# Patient Record
Sex: Female | Born: 1950
Health system: Southern US, Community
[De-identification: ages and names within clinical notes are randomized; demographics above are authoritative.]

## PROBLEM LIST (undated history)

## (undated) DIAGNOSIS — K2970 Gastritis, unspecified, without bleeding: Secondary | ICD-10-CM

## (undated) DIAGNOSIS — K635 Polyp of colon: Secondary | ICD-10-CM

## (undated) DIAGNOSIS — G5600 Carpal tunnel syndrome, unspecified upper limb: Secondary | ICD-10-CM

## (undated) DIAGNOSIS — E785 Hyperlipidemia, unspecified: Secondary | ICD-10-CM

## (undated) DIAGNOSIS — F329 Major depressive disorder, single episode, unspecified: Secondary | ICD-10-CM

## (undated) DIAGNOSIS — I10 Essential (primary) hypertension: Secondary | ICD-10-CM

## (undated) DIAGNOSIS — T8859XA Other complications of anesthesia, initial encounter: Secondary | ICD-10-CM

## (undated) DIAGNOSIS — F32A Depression, unspecified: Secondary | ICD-10-CM

## (undated) DIAGNOSIS — E119 Type 2 diabetes mellitus without complications: Secondary | ICD-10-CM

## (undated) DIAGNOSIS — G709 Myoneural disorder, unspecified: Secondary | ICD-10-CM

## (undated) HISTORY — DX: Essential (primary) hypertension: I10

## (undated) HISTORY — DX: Myoneural disorder, unspecified: G70.9

## (undated) HISTORY — DX: Major depressive disorder, single episode, unspecified: F32.9

## (undated) HISTORY — DX: Gastritis, unspecified, without bleeding: K29.70

## (undated) HISTORY — DX: Carpal tunnel syndrome, unspecified upper limb: G56.00

## (undated) HISTORY — DX: Polyp of colon: K63.5

## (undated) HISTORY — DX: Hyperlipidemia, unspecified: E78.5

## (undated) HISTORY — DX: Depression, unspecified: F32.A

## (undated) HISTORY — DX: Type 2 diabetes mellitus without complications: E11.9

---

## 2000-11-08 ENCOUNTER — Other Ambulatory Visit: Admission: RE | Admit: 2000-11-08 | Discharge: 2000-11-08 | Payer: Self-pay | Admitting: *Deleted

## 2001-01-23 ENCOUNTER — Ambulatory Visit (HOSPITAL_COMMUNITY): Admission: RE | Admit: 2001-01-23 | Discharge: 2001-01-23 | Payer: Self-pay | Admitting: Gastroenterology

## 2002-05-27 ENCOUNTER — Ambulatory Visit (HOSPITAL_COMMUNITY): Admission: RE | Admit: 2002-05-27 | Discharge: 2002-05-27 | Payer: Self-pay | Admitting: Internal Medicine

## 2002-05-27 ENCOUNTER — Encounter: Payer: Self-pay | Admitting: Internal Medicine

## 2004-05-10 ENCOUNTER — Other Ambulatory Visit: Admission: RE | Admit: 2004-05-10 | Discharge: 2004-05-10 | Payer: Self-pay | Admitting: Obstetrics and Gynecology

## 2004-05-17 ENCOUNTER — Ambulatory Visit (HOSPITAL_COMMUNITY): Admission: RE | Admit: 2004-05-17 | Discharge: 2004-05-17 | Payer: Self-pay | Admitting: Obstetrics and Gynecology

## 2004-11-02 ENCOUNTER — Encounter: Admission: RE | Admit: 2004-11-02 | Discharge: 2004-11-02 | Payer: Self-pay | Admitting: Internal Medicine

## 2006-01-30 ENCOUNTER — Ambulatory Visit (HOSPITAL_COMMUNITY): Admission: RE | Admit: 2006-01-30 | Discharge: 2006-01-30 | Payer: Self-pay | Admitting: Obstetrics and Gynecology

## 2007-02-20 ENCOUNTER — Ambulatory Visit (HOSPITAL_COMMUNITY): Admission: RE | Admit: 2007-02-20 | Discharge: 2007-02-20 | Payer: Self-pay | Admitting: Pediatrics

## 2007-03-03 ENCOUNTER — Ambulatory Visit (HOSPITAL_COMMUNITY): Admission: RE | Admit: 2007-03-03 | Discharge: 2007-03-03 | Payer: Self-pay | Admitting: Internal Medicine

## 2007-10-27 ENCOUNTER — Ambulatory Visit (HOSPITAL_COMMUNITY): Admission: RE | Admit: 2007-10-27 | Discharge: 2007-10-27 | Payer: Self-pay | Admitting: Obstetrics and Gynecology

## 2007-11-21 ENCOUNTER — Other Ambulatory Visit: Admission: RE | Admit: 2007-11-21 | Discharge: 2007-11-21 | Payer: Self-pay | Admitting: Obstetrics and Gynecology

## 2008-03-01 ENCOUNTER — Ambulatory Visit (HOSPITAL_COMMUNITY): Admission: RE | Admit: 2008-03-01 | Discharge: 2008-03-01 | Payer: Self-pay | Admitting: Obstetrics and Gynecology

## 2008-06-28 ENCOUNTER — Ambulatory Visit (HOSPITAL_COMMUNITY): Admission: RE | Admit: 2008-06-28 | Discharge: 2008-06-28 | Payer: Self-pay | Admitting: Gastroenterology

## 2008-10-14 ENCOUNTER — Ambulatory Visit (HOSPITAL_COMMUNITY): Admission: RE | Admit: 2008-10-14 | Discharge: 2008-10-14 | Payer: Self-pay | Admitting: Obstetrics and Gynecology

## 2008-11-22 ENCOUNTER — Other Ambulatory Visit: Admission: RE | Admit: 2008-11-22 | Discharge: 2008-11-22 | Payer: Self-pay | Admitting: Obstetrics and Gynecology

## 2009-03-16 ENCOUNTER — Ambulatory Visit (HOSPITAL_COMMUNITY): Admission: RE | Admit: 2009-03-16 | Discharge: 2009-03-16 | Payer: Self-pay | Admitting: Family Medicine

## 2010-07-31 ENCOUNTER — Ambulatory Visit (HOSPITAL_COMMUNITY)
Admission: RE | Admit: 2010-07-31 | Discharge: 2010-07-31 | Payer: Self-pay | Source: Home / Self Care | Attending: Family Medicine | Admitting: Family Medicine

## 2011-01-05 NOTE — Procedures (Signed)
[place]. [facility]  Patient:    Kelli Moss, Kelli Moss                      MRN: [id number] Proc. Date: 01/23/01 Adm. Date:  [id number] Attending:  Charna Elizabeth CC:         Lilia Pro, M.D.   Procedure Report  DATE OF BIRTH:  November 25, 1950  REFERRING PHYSICIAN:  Lilia Pro, M.D.  PROCEDURE PERFORMED:  Screening colonoscopy.  ENDOSCOPIST:  Anselmo Rod, M.D.  INSTRUMENT USED:  Olympus video pediatric colonoscope.  INDICATIONS FOR PROCEDURE:  Family history of colon cancer and a previous history of iron deficiency anemia in a 60 year old white female, rule out colonic polyps, masses, hemorrhoids etc.  PREPROCEDURE PREPARATION:  Informed consent was procured from the patient. The patient was fasted for eight hours prior to the procedure and prepped with a bottle of magnesium citrate and a gallon of NuLytely the night prior to the procedure.  PREPROCEDURE PHYSICAL:  The patient had stable vital signs.  Neck supple. Chest clear to auscultation.  S1, S2 regular.  Abdomen soft with normal abdominal bowel sounds.  DESCRIPTION OF PROCEDURE:  The patient was placed in the left lateral decubitus position and sedated with 40 mg of Demerol and 4 mg of  Versed intravenously.  Once the patient was adequately sedated and maintained on low-flow oxygen and continuous cardiac monitoring, the Olympus video colonoscope was advanced from the rectum to the cecum without difficulty.  The patient had a fairly good prep. There was early left-sided diverticulosis, no masses, polyps, erosions, ulcerations or AVMs seen.  The patient tolerated the procedure well.  The cecal base and ileocecal valve were clearly visualized and photographs were taken.  The patient tolerated the procedure well without complications.  IMPRESSION:  Early left-sided diverticulosis.  Otherwise, normal-appearing colon.  RECOMMENDATIONS: 1. A high fiber diet and liberal fluid intake has been  advocated. 2. Repeat colorectal cancer screening is recommended in the next five years    unless the patient were to develop any abnormal symptoms in the interim.DD: 01/23/01 TD:  01/23/01 Job: [id number]

## 2012-01-11 ENCOUNTER — Other Ambulatory Visit: Payer: Self-pay | Admitting: Family Medicine

## 2012-01-11 DIAGNOSIS — Z139 Encounter for screening, unspecified: Secondary | ICD-10-CM

## 2012-01-15 ENCOUNTER — Ambulatory Visit (HOSPITAL_COMMUNITY): Payer: Self-pay

## 2012-01-21 ENCOUNTER — Ambulatory Visit (HOSPITAL_COMMUNITY)
Admission: RE | Admit: 2012-01-21 | Discharge: 2012-01-21 | Disposition: A | Discharge status: Home / Self Care | Payer: BC Managed Care – PPO | Source: Ambulatory Visit | Attending: Family Medicine | Admitting: Family Medicine

## 2012-01-21 DIAGNOSIS — Z139 Encounter for screening, unspecified: Secondary | ICD-10-CM

## 2012-01-21 DIAGNOSIS — Z1231 Encounter for screening mammogram for malignant neoplasm of breast: Secondary | ICD-10-CM | POA: Insufficient documentation

## 2013-01-13 ENCOUNTER — Encounter: Payer: Self-pay | Admitting: Physician Assistant

## 2013-01-22 ENCOUNTER — Ambulatory Visit (INDEPENDENT_AMBULATORY_CARE_PROVIDER_SITE_OTHER): Payer: BC Managed Care – PPO | Admitting: Physician Assistant

## 2013-01-22 ENCOUNTER — Encounter: Payer: Self-pay | Admitting: Physician Assistant

## 2013-01-22 VITALS — BP 144/86 | HR 76 | Temp 97.1°F | Resp 18 | Ht 64.5 in | Wt 274.0 lb

## 2013-01-22 DIAGNOSIS — R3915 Urgency of urination: Secondary | ICD-10-CM

## 2013-01-22 DIAGNOSIS — Z8542 Personal history of malignant neoplasm of other parts of uterus: Secondary | ICD-10-CM

## 2013-01-22 DIAGNOSIS — Z01419 Encounter for gynecological examination (general) (routine) without abnormal findings: Secondary | ICD-10-CM

## 2013-01-22 DIAGNOSIS — Z1239 Encounter for other screening for malignant neoplasm of breast: Secondary | ICD-10-CM

## 2013-01-22 DIAGNOSIS — N390 Urinary tract infection, site not specified: Secondary | ICD-10-CM

## 2013-01-22 LAB — URINALYSIS, ROUTINE W REFLEX MICROSCOPIC
Bilirubin Urine: NEGATIVE
Glucose, UA: NEGATIVE mg/dL
Ketones, ur: NEGATIVE mg/dL
Nitrite: NEGATIVE
Protein, ur: NEGATIVE mg/dL
Specific Gravity, Urine: 1.025 (ref 1.005–1.030)
Urobilinogen, UA: 0.2 mg/dL (ref 0.0–1.0)
pH: 6 (ref 5.0–8.0)

## 2013-01-22 LAB — URINALYSIS, MICROSCOPIC ONLY
Casts: NONE SEEN
Crystals: NONE SEEN

## 2013-01-22 MED ORDER — CIPROFLOXACIN HCL 500 MG PO TABS: 500.0000 mg | ORAL_TABLET | Freq: Two times a day (BID) | ORAL | Status: DC

## 2013-01-22 MED ORDER — LORAZEPAM 1 MG PO TABS: 1.0000 mg | ORAL_TABLET | Freq: Every evening | ORAL | Status: DC | PRN

## 2013-01-22 NOTE — Progress Notes (Signed)
Patient ID: Kelli Moss MRN: [id number], DOB: 1951-07-08, 62 y.o. Date of Encounter: @DATE @  Chief Complaint:  Chief Complaint  Patient presents with  . Gynecologic Exam    has had hyst but has one ovary    HPI: 62 y.o. year old female  presents for Gyn exam. She sees Dr.Pickard for all of her other medical care but she has known him personally since he was a child. She is here to see me for breast and pelvic exam. She reports h/o hysterectomy and oophorectomy. Reports this was done secondary to "hevy bleeding and abnormal squamous cells." She has had no further vaginal bleeding since. She does self breast exams and has felt no suspicious masses.  She does want to check for UTI. Has been having urinary frequency and urgency. Very minimal dysuria. No fever/chills, no back pain.     History reviewed. No pertinent past medical history.   Home Meds: See attached medication section for current medication list. Any medications entered into computer today will not appear on this note's list. The medications listed below were entered prior to today. No current outpatient prescriptions on file prior to visit.   No current facility-administered medications on file prior to visit.    Allergies:  Allergies  Allergen Reactions  . Lyrica (Pregabalin)     nightmares  . Neurontin (Gabapentin)     Very whoozy    History   Social History  . Marital Status: Married    Spouse Name: N/A    Number of Children: N/A  . Years of Education: N/A   Occupational History  . Not on file.   Social History Main Topics  . Smoking status: Former Smoker    Quit date: 01/23/1988  . Smokeless tobacco: Never Used  . Alcohol Use: No  . Drug Use: No  . Sexually Active: Not on file   Other Topics Concern  . Not on file   Social History Narrative  . No narrative on file    No family history on file.   Review of Systems:  See HPI for pertinent ROS. All other ROS negative.    Physical  Exam: Blood pressure 144/86, pulse 76, temperature 97.1 F (36.2 C), temperature source Oral, resp. rate 18, height 5' 4.5" (1.638 m), weight 274 lb (124.286 kg)., Body mass index is 46.32 kg/(m^2). General: Obese WF. Appears in no acute distress. Neck: Supple. No thyromegaly. No lymphadenopathy. Breast Exam: Normal. No masses with plapation. No skin changes. Nipples normal with no inversion or discharge.  Lungs: Clear bilaterally to auscultation without wheezes, rales, or rhonchi. Breathing is unlabored. Heart: RRR with S1 S2. No murmurs, rubs, or gallops. Back: No costophrenic angle tenderness with percussion bilaterally Abdomen: Soft, non-tender, non-distended with normoactive bowel sounds. No hepatomegaly. No rebound/guarding. No obvious abdominal masses. Pelvic Exam: External Genitalia: normal. No lesions. Vaginal mucosa normal. Bimanual exam with no masses, normal, c/w hysterectomy and oophorectomy. Psych:  Responds to questions appropriately with a normal affect.   Results for orders placed in visit on 01/22/13  URINALYSIS, ROUTINE W REFLEX MICROSCOPIC      Result Value Range   Color, Urine YELLOW  YELLOW   APPearance CLEAR  CLEAR   Specific Gravity, Urine 1.025  1.005 - 1.030   pH 6.0  5.0 - 8.0   Glucose, UA NEG  NEG mg/dL   Bilirubin Urine NEG  NEG   Ketones, ur NEG  NEG mg/dL   Hgb urine dipstick SMALL (*) NEG  Protein, ur NEG  NEG mg/dL   Urobilinogen, UA 0.2  0.0 - 1.0 mg/dL   Nitrite NEG  NEG   Leukocytes, UA MOD (*) NEG  URINALYSIS, MICROSCOPIC ONLY      Result Value Range   Squamous Epithelial / LPF RARE  RARE   Crystals NONE SEEN  NONE SEEN   Casts NONE SEEN  NONE SEEN   WBC, UA 21-50 (*) <3 WBC/hpf   RBC / HPF 0-2  <3 RBC/hpf   Bacteria, UA FEW (*) RARE     ASSESSMENT AND PLAN:  62 y.o. year old female with  1. Screening breast examination-Nml exam 2. Screening Mammogram: 01/21/2012-Negative. She is aware this is due and she will schedule this.  3. Visit  for pelvic exam 4. H/O Hysterectomy but h/o "Abnormal Squamous Cells"-will send pap.                                                         - Pap IG (Image Guided)  5. UTI (urinary tract infection) - ciprofloxacin (CIPRO) 500 MG tablet; Take 1 tablet (500 mg total) by mouth 2 (two) times daily.  Dispense: 14 tablet; Refill: 0  5. Urgency of urination - Urinalysis, Routine w reflex microscopic - ciprofloxacin (CIPRO) 500 MG tablet; Take 1 tablet (500 mg total) by mouth 2 (two) times daily.  Dispense: 14 tablet; Refill: 0   Signed, [address] [place], [place], [facility] 01/22/2013 2:57 PM

## 2013-01-23 ENCOUNTER — Other Ambulatory Visit: Payer: Self-pay | Admitting: Physician Assistant

## 2013-01-23 LAB — PAP IG (IMAGE GUIDED)

## 2013-01-30 ENCOUNTER — Encounter: Payer: Self-pay | Admitting: Family Medicine

## 2013-03-20 ENCOUNTER — Telehealth: Payer: Self-pay | Admitting: Family Medicine

## 2013-03-20 MED ORDER — BENAZEPRIL HCL 40 MG PO TABS: 40.0000 mg | ORAL_TABLET | Freq: Every day | ORAL | Status: DC

## 2013-03-20 NOTE — Telephone Encounter (Signed)
Medication refilled per protocol. 

## 2013-08-24 ENCOUNTER — Ambulatory Visit (INDEPENDENT_AMBULATORY_CARE_PROVIDER_SITE_OTHER): Payer: BC Managed Care – PPO | Admitting: Family Medicine

## 2013-08-24 ENCOUNTER — Encounter: Payer: Self-pay | Admitting: Family Medicine

## 2013-08-24 VITALS — BP 138/80 | HR 86 | Temp 98.2°F | Resp 16 | Ht 68.0 in | Wt 279.0 lb

## 2013-08-24 DIAGNOSIS — N342 Other urethritis: Secondary | ICD-10-CM

## 2013-08-24 DIAGNOSIS — Z01818 Encounter for other preprocedural examination: Secondary | ICD-10-CM

## 2013-08-24 DIAGNOSIS — E785 Hyperlipidemia, unspecified: Secondary | ICD-10-CM | POA: Insufficient documentation

## 2013-08-24 DIAGNOSIS — R319 Hematuria, unspecified: Secondary | ICD-10-CM

## 2013-08-24 DIAGNOSIS — G5600 Carpal tunnel syndrome, unspecified upper limb: Secondary | ICD-10-CM | POA: Insufficient documentation

## 2013-08-24 DIAGNOSIS — G709 Myoneural disorder, unspecified: Secondary | ICD-10-CM | POA: Insufficient documentation

## 2013-08-24 LAB — CBC WITH DIFFERENTIAL/PLATELET
Basophils Absolute: 0 10*3/uL (ref 0.0–0.1)
Basophils Relative: 1 % (ref 0–1)
Eosinophils Absolute: 0.2 10*3/uL (ref 0.0–0.7)
Eosinophils Relative: 3 % (ref 0–5)
HCT: 37.5 % (ref 36.0–46.0)
Hemoglobin: 13.1 g/dL (ref 12.0–15.0)
Lymphocytes Relative: 28 % (ref 12–46)
Lymphs Abs: 1.8 10*3/uL (ref 0.7–4.0)
MCH: 28.7 pg (ref 26.0–34.0)
MCHC: 34.9 g/dL (ref 30.0–36.0)
MCV: 82.2 fL (ref 78.0–100.0)
Monocytes Absolute: 0.4 10*3/uL (ref 0.1–1.0)
Monocytes Relative: 6 % (ref 3–12)
Neutro Abs: 4.1 10*3/uL (ref 1.7–7.7)
Neutrophils Relative %: 62 % (ref 43–77)
Platelets: 215 10*3/uL (ref 150–400)
RBC: 4.56 MIL/uL (ref 3.87–5.11)
RDW: 14.7 % (ref 11.5–15.5)
WBC: 6.5 10*3/uL (ref 4.0–10.5)

## 2013-08-24 LAB — COMPLETE METABOLIC PANEL WITH GFR
ALT: 14 U/L (ref 0–35)
AST: 14 U/L (ref 0–37)
Albumin: 4 g/dL (ref 3.5–5.2)
Alkaline Phosphatase: 65 U/L (ref 39–117)
BUN: 8 mg/dL (ref 6–23)
CO2: 26 mEq/L (ref 19–32)
Calcium: 8.4 mg/dL (ref 8.4–10.5)
Chloride: 105 mEq/L (ref 96–112)
Creat: 0.52 mg/dL (ref 0.50–1.10)
GFR, Est African American: >89 mL/min
GFR, Est Non African American: >89 mL/min
Glucose, Bld: 96 mg/dL (ref 70–99)
Potassium: 4 mEq/L (ref 3.5–5.3)
Sodium: 140 mEq/L (ref 135–145)
Total Bilirubin: 0.4 mg/dL (ref 0.3–1.2)
Total Protein: 6.5 g/dL (ref 6.0–8.3)

## 2013-08-24 LAB — URINALYSIS, MICROSCOPIC ONLY
Bacteria, UA: NONE SEEN
Casts: NONE SEEN
Crystals: NONE SEEN
WBC, UA: NONE SEEN WBC/hpf (ref <3)

## 2013-08-24 LAB — URINALYSIS, ROUTINE W REFLEX MICROSCOPIC
Bilirubin Urine: NEGATIVE
Glucose, UA: NEGATIVE mg/dL
Ketones, ur: NEGATIVE mg/dL
Leukocytes, UA: NEGATIVE
Nitrite: NEGATIVE
Protein, ur: NEGATIVE mg/dL
Specific Gravity, Urine: 1.015 (ref 1.005–1.030)
Urobilinogen, UA: 0.2 mg/dL (ref 0.0–1.0)
pH: 7 (ref 5.0–8.0)

## 2013-08-24 NOTE — Progress Notes (Signed)
Subjective:    Patient ID: Kelli Moss, female    DOB: 1950-10-08, 63 y.o.   MRN: [id number]  HPI Patient has a past medical history of cervical spinal stenosis as well as severe bilateral carpal tunnel syndrome. She is currently complaining of numbness and pain in both hands although the right is greater than left. The pain involves the first 4 digits on both hands but does not involve the fifth digit. It is also exacerbated by position including driving, peeling potatoes, and sleeping. She has chronic neck pain and decreased range of motion in the cervical spine. She is interested in her next option. She is leaving to go to [place] in a few days. Past Medical History  Diagnosis Date  . Depression   . Diabetes mellitus without complication     prediabetes  . Dyslipidemia   . Hypertension   . Neuromuscular disorder     peripheral neuropathy  . CTS (carpal tunnel syndrome)    Current Outpatient Prescriptions on File Prior to Visit  Medication Sig Dispense Refill  . amitriptyline (ELAVIL) 25 MG tablet Take 1 tablet by mouth 2 (two) times daily. Takes one in AM, Three in PM      . aspirin 81 MG tablet Take 81 mg by mouth daily.      . benazepril (LOTENSIN) 40 MG tablet Take 1 tablet (40 mg total) by mouth daily.  90 tablet  0  . CINNAMON PO Take 2,000 mg by mouth daily.      . cyanocobalamin (,VITAMIN B-12,) 1000 MCG/ML injection Inject 1,000 mcg into the muscle every 30 (thirty) days.      . Flaxseed, Linseed, (FLAX SEEDS PO) Take 1 tablet by mouth daily.      Marland Kitchen LORazepam (ATIVAN) 1 MG tablet Take 1 tablet (1 mg total) by mouth at bedtime as needed for anxiety.  90 tablet  0   No current facility-administered medications on file prior to visit.   Allergies  Allergen Reactions  . Lyrica [Pregabalin]     nightmares  . Neurontin [Gabapentin]     Very whoozy   History   Social History  . Marital Status: Married    Spouse Name: N/A    Number of Children: N/A  . Years of  Education: N/A   Occupational History  . Not on file.   Social History Main Topics  . Smoking status: Former Smoker    Quit date: 01/23/1988  . Smokeless tobacco: Never Used  . Alcohol Use: No  . Drug Use: No  . Sexual Activity: Not on file   Other Topics Concern  . Not on file   Social History Narrative  . No narrative on file   patient is married with 2 grown children.    Review of Systems  All other systems reviewed and are negative.       Objective:   Physical Exam  Vitals reviewed. Cardiovascular: Normal rate, regular rhythm and normal heart sounds.   No murmur heard. Pulmonary/Chest: Effort normal and breath sounds normal. No respiratory distress. She has no wheezes. She has no rales.   patient has a positive Tinel sign in both wrists. She has a positive Phalen sign in both wrists. She has a negative Spurling sign. She has normal strength in hands. She has normal reflexes in both arms. She has no evidence of neuromuscular weakness.        Assessment & Plan:  Preoperative clearance - Plan: CBC with Differential, COMPLETE METABOLIC PANEL  WITH GFR, EKG 12-Lead  I believe the patient likely has carpal tunnel syndrome although I cannot totally exclude cervical radiculopathy. However I feel cervical radiculopathy is very unlikely. I recommended no conduction test and an evaluation by an orthopedic surgeon for carpal tunnel release. She like to pursue this in [place]. He does state that she needs preoperative surgical clearance prior to seeing the surgeon. This includes a CBC, CMP, and an EKG. I will obtain these today.  EKG today in the office shows normal sinus rhythm at 89 beats per minute with normal intervals and a normal axis. There is no evidence of ischemia or infarction. She does have an isolated Q wave in lead 3. However this is not present in leads 2 or AVF and I believe is a false positive.

## 2013-08-24 NOTE — Addendum Note (Signed)
Addended by: Sharmon Revere on: 08/24/2013 10:38 AM   Modules accepted: Orders

## 2013-08-25 ENCOUNTER — Encounter: Payer: Self-pay | Admitting: *Deleted

## 2013-09-02 ENCOUNTER — Other Ambulatory Visit: Payer: Self-pay | Admitting: Family Medicine

## 2013-09-11 ENCOUNTER — Telehealth: Payer: Self-pay | Admitting: Family Medicine

## 2013-09-11 NOTE — Telephone Encounter (Signed)
Patient is out of town.  Having increased stress.  Wants to know if she can take more then one Ativan a day?  Please call and let them know?

## 2013-09-11 NOTE — Telephone Encounter (Signed)
She can use ativan every eight hours as needed for anxiety.

## 2013-09-11 NOTE — Telephone Encounter (Signed)
Spoke to husband.  Per provider patient can take her Ativan every 8 hours As Needed.  Dose change made to med list.  If refill needed let us know.

## 2013-09-24 ENCOUNTER — Telehealth: Payer: Self-pay | Admitting: Family Medicine

## 2013-09-24 MED ORDER — AMOXICILLIN 875 MG PO TABS: 875.0000 mg | ORAL_TABLET | Freq: Two times a day (BID) | ORAL | Status: DC

## 2013-09-24 NOTE — Telephone Encounter (Signed)
Ok for amoxicillin 875 mg pobid x 10 days

## 2013-09-24 NOTE — Telephone Encounter (Signed)
Pt thinks she has a sinus infection and was wondering if you would call her in something. She is flying back to [place] next week. She is having a lot of head congestion with yellow and green drainage and some facial pain. She said she would be glad to come in for an OV but you have nothing until next week. Please advise.

## 2013-09-24 NOTE — Telephone Encounter (Signed)
Patient aware and med sent to pharmacy.  

## 2014-02-03 ENCOUNTER — Other Ambulatory Visit: Payer: Self-pay | Admitting: Family Medicine

## 2014-02-03 DIAGNOSIS — Z1231 Encounter for screening mammogram for malignant neoplasm of breast: Secondary | ICD-10-CM

## 2014-02-05 ENCOUNTER — Ambulatory Visit (HOSPITAL_COMMUNITY): Payer: BC Managed Care – PPO

## 2014-02-09 ENCOUNTER — Ambulatory Visit (HOSPITAL_COMMUNITY)
Admission: RE | Admit: 2014-02-09 | Discharge: 2014-02-09 | Disposition: A | Discharge status: Home / Self Care | Payer: BC Managed Care – PPO | Source: Ambulatory Visit | Attending: Family Medicine | Admitting: Family Medicine

## 2014-02-09 DIAGNOSIS — Z1231 Encounter for screening mammogram for malignant neoplasm of breast: Secondary | ICD-10-CM | POA: Insufficient documentation

## 2014-04-06 ENCOUNTER — Other Ambulatory Visit: Payer: Self-pay | Admitting: Family Medicine

## 2014-04-06 ENCOUNTER — Other Ambulatory Visit: Payer: Self-pay | Admitting: Physician Assistant

## 2014-04-06 NOTE — Telephone Encounter (Signed)
Ok to refill??  Last office visit 08/24/2013.  Last refill 09/11/2013.

## 2014-04-06 NOTE — Telephone Encounter (Signed)
Refill appropriate and filled per protocol. 

## 2014-04-07 NOTE — Telephone Encounter (Signed)
Due for office visit. She saw me for an office visit in June 2014 for GYN exam. She saw Dr. Dennard Schaumann January 2015 for preoperative clearance. She is on blood pressure medication as well as other medicines which need to be monitored every 6 months. Have her schedule an office visit--can find out who she prefers to schedule appointment with. As stated above she has seen both me and Dr. Dennard Schaumann recent.

## 2014-04-07 NOTE — Telephone Encounter (Signed)
Call placed to patient. LMTRC.  

## 2014-04-08 NOTE — Telephone Encounter (Signed)
Patient states that she is currently in [place] and will schedule appointment with MD when she returns.   Ok to refill?

## 2014-04-09 NOTE — Telephone Encounter (Signed)
ok 

## 2014-04-09 NOTE — Telephone Encounter (Signed)
Medication called to pharmacy. 

## 2014-05-05 ENCOUNTER — Encounter: Payer: Self-pay | Admitting: Family Medicine

## 2014-05-10 ENCOUNTER — Encounter: Payer: Self-pay | Admitting: Family Medicine

## 2014-05-10 ENCOUNTER — Ambulatory Visit (INDEPENDENT_AMBULATORY_CARE_PROVIDER_SITE_OTHER): Payer: BC Managed Care – PPO | Admitting: Family Medicine

## 2014-05-10 VITALS — BP 132/74 | HR 96 | Temp 98.3°F | Resp 18 | Ht 66.5 in | Wt 284.0 lb

## 2014-05-10 DIAGNOSIS — Z23 Encounter for immunization: Secondary | ICD-10-CM

## 2014-05-10 DIAGNOSIS — Z0000 Encounter for general adult medical examination without abnormal findings: Secondary | ICD-10-CM

## 2014-05-10 MED ORDER — LORCASERIN HCL 10 MG PO TABS: 1.0000 | ORAL_TABLET | Freq: Two times a day (BID) | ORAL | Status: DC

## 2014-05-10 NOTE — Progress Notes (Signed)
Subjective:    Patient ID: Kelli Moss, female    DOB: 10-05-1950, 63 y.o.   MRN: [id number]  HPI Patient is a 63 year old white female who is here today for complete physical exam. She sees her gynecologist for her pelvic exam and Pap smear. She has a history of total abdominal hysterectomy. She does still have her right ovary. Her colonoscopy is up to date. She will see her gastroenterologist in November.  Her mammogram is up to date. She is not yet due for her pneumonia vaccines. Her tetanus vaccine is up to date. She is due for an annual flu shot today. Her blood pressure is well-controlled at 132/74. Unfortunately he continues to be an issue for this patient. Chest diabetes as well as dyslipidemia and metabolic syndrome. She has a difficult time exercising due to severe pain in her neck stemming from osteoarthritis of the neck. She has tried changing her diet but has been unsuccessful in losing weight. Past Medical History  Diagnosis Date  . Depression   . Diabetes mellitus without complication     prediabetes  . Dyslipidemia   . Hypertension   . Neuromuscular disorder     peripheral neuropathy  . CTS (carpal tunnel syndrome)    No past surgical history on file. Current Outpatient Prescriptions on File Prior to Visit  Medication Sig Dispense Refill  . amitriptyline (ELAVIL) 25 MG tablet TAKE 4 TABLETS BY MOUTH AT BEDTIME  360 tablet  2  . aspirin 81 MG tablet Take 81 mg by mouth daily.      . benazepril (LOTENSIN) 40 MG tablet TAKE 1 TABLET BY MOUTH EVERY DAY  90 tablet  3  . CINNAMON PO Take 2,000 mg by mouth daily.      . cyanocobalamin (,VITAMIN B-12,) 1000 MCG/ML injection Inject 1,000 mcg into the muscle every 30 (thirty) days.      . Flaxseed, Linseed, (FLAX SEEDS PO) Take 1 tablet by mouth daily.      Marland Kitchen LORazepam (ATIVAN) 1 MG tablet TAKE 1 TABLET BY MOUTH AT BEDTIME AS NEEDED  90 tablet  0   No current facility-administered medications on file prior to visit.    Allergies  Allergen Reactions  . Lyrica [Pregabalin]     nightmares  . Neurontin [Gabapentin]     Very whoozy   History   Social History  . Marital Status: Married    Spouse Name: N/A    Number of Children: N/A  . Years of Education: N/A   Occupational History  . Not on file.   Social History Main Topics  . Smoking status: Former Smoker    Quit date: 01/23/1988  . Smokeless tobacco: Never Used  . Alcohol Use: No  . Drug Use: No  . Sexual Activity: Not on file   Other Topics Concern  . Not on file   Social History Narrative  . No narrative on file   No family history on file.    Review of Systems  All other systems reviewed and are negative.      Objective:   Physical Exam  Vitals reviewed. Constitutional: She is oriented to person, place, and time. She appears well-developed and well-nourished. No distress.  HENT:  Head: Normocephalic and atraumatic.  Right Ear: External ear normal.  Left Ear: External ear normal.  Nose: Nose normal.  Mouth/Throat: Oropharynx is clear and moist. No oropharyngeal exudate.  Eyes: Conjunctivae and EOM are normal. Pupils are equal, round, and reactive to light. No  scleral icterus.  Neck: Normal range of motion. Neck supple. No JVD present. No tracheal deviation present. No thyromegaly present.  Cardiovascular: Normal rate, regular rhythm and normal heart sounds.  Exam reveals no gallop and no friction rub.   No murmur heard. Pulmonary/Chest: Effort normal and breath sounds normal. No stridor. No respiratory distress. She has no wheezes. She has no rales. She exhibits no tenderness.  Abdominal: Soft. Bowel sounds are normal. She exhibits no distension and no mass. There is no tenderness. There is no rebound and no guarding.  Musculoskeletal: Normal range of motion. She exhibits no edema and no tenderness.  Lymphadenopathy:    She has no cervical adenopathy.  Neurological: She is alert and oriented to person, place, and time.  She has normal reflexes. She displays normal reflexes. No cranial nerve deficit. She exhibits normal muscle tone. Coordination normal.  Skin: Skin is warm. No rash noted. She is not diaphoretic. No erythema. No pallor.  Psychiatric: She has a normal mood and affect. Her behavior is normal. Judgment and thought content normal.          Assessment & Plan:  Routine general medical examination at a health care facility  prior to today's office visit, I had reviewed her lab work from her gastroenterologist which included CBC, CMP, and fasting lipid panel. Labs are significant for stable prediabetes as well as dyslipidemia and low HDL cholesterol. We discussed increasing aerobic exercise to address this. We also discussed weight loss. I have recommended that the patient try belviq 10 mg by mouth twice a day to help facilitate weight loss. I also like to see her increase aerobic exercise. Recheck in 6 months. Patient also received a flu vaccine today. The remainder of her preventive care is up to date. Patient will see gynecologist for Pap smear and pelvic exam.

## 2014-05-21 ENCOUNTER — Telehealth: Payer: Self-pay | Admitting: Physician Assistant

## 2014-05-21 MED ORDER — AMOXICILLIN 875 MG PO TABS: 875.0000 mg | ORAL_TABLET | Freq: Two times a day (BID) | ORAL | Status: DC

## 2014-05-21 NOTE — Telephone Encounter (Signed)
[phone] or [phone] patient is in [place] and wants to know if we can call her in an antibiotic if possible for sinus infection  [facility] [place]

## 2014-05-21 NOTE — Telephone Encounter (Signed)
Amoxicillin 875 bid for 10 days 

## 2014-05-21 NOTE — Telephone Encounter (Signed)
Rx to pharmacy and patient made aware

## 2014-06-28 LAB — HM COLONOSCOPY

## 2014-06-29 ENCOUNTER — Encounter: Payer: Self-pay | Admitting: Family Medicine

## 2014-07-07 ENCOUNTER — Encounter: Payer: Self-pay | Admitting: Family Medicine

## 2014-07-12 ENCOUNTER — Encounter: Payer: Self-pay | Admitting: Family Medicine

## 2014-08-02 ENCOUNTER — Ambulatory Visit (INDEPENDENT_AMBULATORY_CARE_PROVIDER_SITE_OTHER): Payer: BC Managed Care – PPO | Admitting: Family Medicine

## 2014-08-02 ENCOUNTER — Encounter: Payer: Self-pay | Admitting: Family Medicine

## 2014-08-02 VITALS — BP 126/68 | HR 78 | Temp 99.5°F | Resp 18 | Ht 66.5 in | Wt 266.0 lb

## 2014-08-02 DIAGNOSIS — R358 Other polyuria: Secondary | ICD-10-CM

## 2014-08-02 DIAGNOSIS — R3589 Other polyuria: Secondary | ICD-10-CM

## 2014-08-02 DIAGNOSIS — E669 Obesity, unspecified: Secondary | ICD-10-CM

## 2014-08-02 LAB — URINALYSIS, MICROSCOPIC ONLY
Casts: NONE SEEN
RBC / HPF: NONE SEEN RBC/hpf (ref <3)

## 2014-08-02 LAB — URINALYSIS, ROUTINE W REFLEX MICROSCOPIC
Bilirubin Urine: NEGATIVE
Glucose, UA: NEGATIVE mg/dL
Nitrite: NEGATIVE
Protein, ur: 30 mg/dL — AB
Specific Gravity, Urine: >1.03 — ABNORMAL HIGH (ref 1.005–1.030)
Urobilinogen, UA: 0.2 mg/dL (ref 0.0–1.0)
pH: 6 (ref 5.0–8.0)

## 2014-08-02 MED ORDER — LORCASERIN HCL 10 MG PO TABS: 1.0000 | ORAL_TABLET | Freq: Two times a day (BID) | ORAL | Status: DC

## 2014-08-02 MED ORDER — CICLOPIROX 8 % EX SOLN: Freq: Every day | CUTANEOUS | Status: DC

## 2014-08-02 MED ORDER — CYANOCOBALAMIN 1000 MCG/ML IJ SOLN: 1000.0000 ug | INTRAMUSCULAR | Status: DC

## 2014-08-02 NOTE — Progress Notes (Signed)
Subjective:    Patient ID: Kelli Moss, female    DOB: 1951/07/31, 63 y.o.   MRN: [id number]  HPI Patient has a history of dyslipidemia and metabolic syndrome due to morbid obesity. Patient is currently taking Belviq to assist with weight loss. Her most recent weights are listed below: Wt Readings from Last 3 Encounters:  08/02/14 266 lb (120.657 kg)  05/10/14 284 lb (128.822 kg)  08/24/13 279 lb (126.554 kg)   I'm very proud of the patient. She has lost 18 pounds since September. She is trying to watch her diet and exercise. She would like a refill on the belly. Her blood pressures well controlled today at 126/68. Patient recently went her gastroenterologist and had blood work at that appt in September. Patient's hemoglobin A1c was stable. Her fasting blood sugar was 111. She is still prediabetic. Her triglycerides were elevated at 267. Her HDL was low at 35. Her LDL was acceptable at less than 100. She has no complaints today. She is requesting a refill on her vitamin B12. She would also like to continue Penlac for onychomycosis Past Medical History  Diagnosis Date  . Depression   . Diabetes mellitus without complication     prediabetes  . Dyslipidemia   . Hypertension   . Neuromuscular disorder     peripheral neuropathy  . CTS (carpal tunnel syndrome)   . Gastritis   . Colon polyp    No past surgical history on file. Current Outpatient Prescriptions on File Prior to Visit  Medication Sig Dispense Refill  . amitriptyline (ELAVIL) 25 MG tablet TAKE 4 TABLETS BY MOUTH AT BEDTIME 360 tablet 2  . aspirin 81 MG tablet Take 81 mg by mouth daily.    . benazepril (LOTENSIN) 40 MG tablet TAKE 1 TABLET BY MOUTH EVERY DAY 90 tablet 3  . CINNAMON PO Take 2,000 mg by mouth daily.    . Flaxseed, Linseed, (FLAX SEEDS PO) Take 1 tablet by mouth daily.    Marland Kitchen LORazepam (ATIVAN) 1 MG tablet TAKE 1 TABLET BY MOUTH AT BEDTIME AS NEEDED 90 tablet 0   No current facility-administered medications  on file prior to visit.   Allergies  Allergen Reactions  . Lyrica [Pregabalin]     nightmares  . Neurontin [Gabapentin]     Very whoozy   History   Social History  . Marital Status: Married    Spouse Name: N/A    Number of Children: N/A  . Years of Education: N/A   Occupational History  . Not on file.   Social History Main Topics  . Smoking status: Former Smoker    Quit date: 01/23/1988  . Smokeless tobacco: Never Used  . Alcohol Use: No  . Drug Use: No  . Sexual Activity: Not on file   Other Topics Concern  . Not on file   Social History Narrative      Review of Systems  All other systems reviewed and are negative.      Objective:   Physical Exam  Cardiovascular: Normal rate, regular rhythm and normal heart sounds.   Pulmonary/Chest: Effort normal and breath sounds normal. No respiratory distress. She has no wheezes. She has no rales.  Abdominal: Soft.  Vitals reviewed.         Assessment & Plan:  Polyuria - Plan: Urinalysis, Routine w reflex microscopic  Obesity  Patient's weight has improved on the bill beak. I like to continue that for an additional 3 months. I would like to  check her fasting lab works after 3 months to see if her  Prediabetes and metabolic syndrome have improved. I will refill the patient's vitamin B12 and also the Penlac. Otherwise her preventative care is up-to-date. She is complaining of some polyuria and urgency. I will also check a urinalysis and treat symptoms if her urinalysis suggesting urinary tract infection.

## 2014-08-17 ENCOUNTER — Telehealth: Payer: Self-pay | Admitting: Physician Assistant

## 2014-08-17 MED ORDER — CIPROFLOXACIN HCL 500 MG PO TABS: 500.0000 mg | ORAL_TABLET | Freq: Two times a day (BID) | ORAL | Status: DC

## 2014-08-17 NOTE — Telephone Encounter (Signed)
Trying to reach patient.  Need to see where she wants Rx sent.  Is she in [place] or [place]??

## 2014-08-17 NOTE — Telephone Encounter (Signed)
[phone] Pt is rtn your call about lab work

## 2014-08-17 NOTE — Telephone Encounter (Signed)
Patient aware of results and rx sent to pt requested pharm

## 2014-08-17 NOTE — Telephone Encounter (Signed)
-----   Message from [name], MD sent at 08/02/2014  4:36 PM EST ----- Urine shows possible uti, begin cipro 500 bid for 3 days.

## 2014-09-07 ENCOUNTER — Telehealth: Payer: Self-pay | Admitting: *Deleted

## 2014-09-07 NOTE — Telephone Encounter (Signed)
amox 875 bid for 10 days

## 2014-09-07 NOTE — Telephone Encounter (Signed)
Pt is in [place] visiting son and has developed a sinus and ear infection and would like to know if you could call something in to [facility] in [place] [facility] [address].   [facility] [address]

## 2014-09-08 MED ORDER — AMOXICILLIN 875 MG PO TABS: 875.0000 mg | ORAL_TABLET | Freq: Two times a day (BID) | ORAL | Status: DC

## 2014-09-08 NOTE — Telephone Encounter (Signed)
Call placed to patient and patient made aware.  

## 2014-09-10 ENCOUNTER — Other Ambulatory Visit: Payer: Self-pay | Admitting: Family Medicine

## 2015-01-25 ENCOUNTER — Telehealth: Payer: Self-pay | Admitting: Physician Assistant

## 2015-01-25 DIAGNOSIS — M542 Cervicalgia: Secondary | ICD-10-CM

## 2015-01-25 DIAGNOSIS — M549 Dorsalgia, unspecified: Secondary | ICD-10-CM

## 2015-01-25 NOTE — Telephone Encounter (Signed)
Patient calling to say that she was supposed to have referral to [facility], and she called and they said there was no order in for this  Please call her at [phone] And their fax number is [phone]

## 2015-01-27 NOTE — Telephone Encounter (Signed)
Ok, please schedule PT

## 2015-01-27 NOTE — Telephone Encounter (Signed)
Pt called back and she stated that when she was here last for OV provider has recommended/suggested her to have PT therapy done for neck and back pain when she come back to [place] has been out to [place].   Pt wants to go to [facility] at [place] on [address]

## 2015-01-27 NOTE — Telephone Encounter (Signed)
Referral placed to PT

## 2015-02-11 ENCOUNTER — Encounter: Payer: Self-pay | Admitting: Physical Therapy

## 2015-02-11 ENCOUNTER — Ambulatory Visit: Payer: BLUE CROSS/BLUE SHIELD | Attending: Physician Assistant | Admitting: Physical Therapy

## 2015-02-11 DIAGNOSIS — M5380 Other specified dorsopathies, site unspecified: Secondary | ICD-10-CM | POA: Insufficient documentation

## 2015-02-11 DIAGNOSIS — M545 Low back pain, unspecified: Secondary | ICD-10-CM

## 2015-02-11 DIAGNOSIS — M6281 Muscle weakness (generalized): Secondary | ICD-10-CM | POA: Diagnosis not present

## 2015-02-11 DIAGNOSIS — M542 Cervicalgia: Secondary | ICD-10-CM

## 2015-02-11 DIAGNOSIS — G8929 Other chronic pain: Secondary | ICD-10-CM | POA: Diagnosis not present

## 2015-02-11 DIAGNOSIS — M2560 Stiffness of unspecified joint, not elsewhere classified: Secondary | ICD-10-CM
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
## 2015-02-11 NOTE — Patient Instructions (Signed)

## 2015-02-14 ENCOUNTER — Ambulatory Visit: Payer: BLUE CROSS/BLUE SHIELD | Admitting: Physical Therapy

## 2015-02-14 DIAGNOSIS — M542 Cervicalgia: Secondary | ICD-10-CM

## 2015-02-14 DIAGNOSIS — M545 Low back pain, unspecified: Secondary | ICD-10-CM

## 2015-02-14 DIAGNOSIS — G8929 Other chronic pain: Principal | ICD-10-CM

## 2015-02-14 DIAGNOSIS — M6281 Muscle weakness (generalized): Secondary | ICD-10-CM

## 2015-02-14 DIAGNOSIS — M2560 Stiffness of unspecified joint, not elsewhere classified: Secondary | ICD-10-CM

## 2015-02-14 NOTE — Patient Instructions (Signed)
Trigger Point Dry Needling  . What is Trigger Point Dry Needling (DN)? o DN is a physical therapy technique used to treat muscle pain and dysfunction. Specifically, DN helps deactivate muscle trigger points (muscle knots).  o A thin filiform needle is used to penetrate the skin and stimulate the underlying trigger point. The goal is for a local twitch response (LTR) to occur and for the trigger point to relax. No medication of any kind is injected during the procedure.   . What Does Trigger Point Dry Needling Feel Like?  o The procedure feels different for each individual patient. Some patients report that they do not actually feel the needle enter the skin and overall the process is not painful. Very mild bleeding may occur. However, many patients feel a deep cramping in the muscle in which the needle was inserted. This is the local twitch response.   Marland Kitchen How Will I feel after the treatment? o Soreness is normal, and the onset of soreness may not occur for a few hours. Typically this soreness does not last longer than two days.  o Bruising is uncommon, however; ice can be used to decrease any possible bruising.  o In rare cases feeling tired or nauseous after the treatment is normal. In addition, your symptoms may get worse before they get better, this period will typically not last longer than 24 hours.   . What Can I do After My Treatment? o Increase your hydration by drinking more water for the next 24 hours. o You may place ice or heat on the areas treated that have become sore, however, do not use heat on inflamed or bruised areas. Heat often brings more relief post needling. o You can continue your regular activities, but vigorous activity is not recommended initially after the treatment for 24 hours. o DN is best combined with other physical therapy such as strengthening, stretching, and other therapies.   Levator Stretch   Grasp seat or sit on hand on side to be stretched. Turn head  toward armpit and look down. Do not use hand on head to stretch like picture.  Hold __30__ seconds. Repeat on other side. Repeat __2-3__ times. Do _2-3___ sessions per day.  http://gt2.exer.us/30   Copyright  VHI. All rights reserved.  Side-Bending   One hand on opposite side of head, pull head to side as far as is comfortable. Stop if there is pain. Hold __30__ seconds. Repeat with other hand to other side. Repeat 2-3____ times. Do _2-3__ sessions per day.   Copyright  VHI. All rights reserved.  Scapular Retraction (Standing)   With arms at sides, pinch shoulder blades together. Repeat _10___ times per set. Do __2__ sets per session. Do _1-2___ sessions per day.  http://orth.exer.us/944   Copyright  VHI. All rights reserved.  Chin Protraction / Retraction   Slide head forward keeping chin level. Slide head back, pulling chin in. Hold each position _5__ seconds. Repeat _5__ times. Do _3-5__ sessions per day.  Copyright  VHI. All rights reserved.  Voncille Lo, PT 02/14/2015 2:27 PM Phone: [phone] Fax: [phone]

## 2015-02-14 NOTE — Therapy (Signed)
[place], [place], [address] Phone: [phone]   Fax:  [phone]  Physical Therapy Treatment  Patient Details  Name: Kelli Moss MRN: [id number] Date of Birth: 1951-04-25 Referring Provider:  Orlena Sheldon, PA-C  Encounter Date: 02/14/2015      PT End of Session - 02/14/15 1530    Visit Number 2   Number of Visits 16   Date for PT Re-Evaluation 04/08/15   Authorization Type BCBS   PT Start Time 0216   PT Stop Time 0333   PT Time Calculation (min) 77 min   Activity Tolerance Patient tolerated treatment well   Behavior During Therapy [facility] for tasks assessed/performed      Past Medical History  Diagnosis Date  . Depression   . Diabetes mellitus without complication     prediabetes  . Dyslipidemia   . Hypertension   . Neuromuscular disorder     peripheral neuropathy  . CTS (carpal tunnel syndrome)   . Gastritis   . Colon polyp     No past surgical history on file.  There were no vitals filed for this visit.  Visit Diagnosis:  Neck pain of over 3 months duration  Bilateral low back pain without sciatica  Joint stiffness of spine  Muscle weakness      Subjective Assessment - 02/14/15 1422    Subjective Pt in same pain from eval.  same pain.    Pertinent History peripheral neuropathy   Limitations Lifting   Diagnostic tests Years ago had MRI showed cervical stenosis and lumbar sublux L4-5    Patient Stated Goals Feel no pain; enjoying grandchildren 5, 4,3,1 standing to push swing   Currently in Pain? Yes   Pain Score 4    Pain Location Neck   Pain Orientation Right;Left   Pain Descriptors / Indicators Spasm;Aching   Pain Type Chronic pain   Pain Onset More than a month ago   Pain Frequency Constant                         [facility] Adult PT Treatment/Exercise - 02/14/15 1424    Moist Heat Therapy   Number Minutes Moist Heat 15 Minutes   Moist Heat Location  Cervical   Manual Therapy   Manual Therapy Soft tissue mobilization   Soft tissue mobilization Bilateral upper traps   Neck Exercises: Stretches   Upper Trapezius Stretch 3 reps;30 seconds  bil   Upper Trapezius Stretch Limitations limited range   Levator Stretch 3 reps;30 seconds  limited range   Levator Stretch Limitations limited ROM   Lower Cervical/Upper Thoracic Stretch 3 reps;10 seconds  in sitting extend back over chair   Other Neck Stretches clock stretch for bil shoulder and neck 3 to 5 o clock with gentle stretch of neck   Other Neck Stretches neck retraction x 5   5 sec          Trigger Point Dry Needling - 02/14/15 1432    Consent Given? Yes   Education Handout Provided Yes   Muscles Treated Upper Body Levator scapulae;Upper trapezius;Rhomboids  bilaterally with dry needling,thoracic T 4 to T 7 erector sp   Upper Trapezius Response Twitch reponse elicited;Palpable increased muscle length   Levator Scapulae Response Twitch response elicited;Palpable increased muscle length   Rhomboids Response Twitch response elicited;Palpable increased muscle length              PT Education -  02/14/15 1529    Education provided Yes   Education Details precautions for dry needling and aftercare , levator /trap, neck retraction and shoulder blade squeeze, self thoracic extension on chaire   Person(s) Educated Patient   Methods Explanation;Demonstration;Verbal cues;Handout   Comprehension Verbalized understanding;Returned demonstration;Need further instruction;Verbal cues required          PT Short Term Goals - 02/14/15 1435    PT SHORT TERM GOAL #1   Time 4   Period Weeks   Status On-going   PT SHORT TERM GOAL #2   Title Cervical sidebending AROM improved to 15 degrees needed for mobility with driving and home chores   Time 4   Period Weeks   Status On-going   PT SHORT TERM GOAL #3   Title Patient will demonstrate compliance with basic, initial HEP needed for  increased ROM and strength needed for ADLs   Time 4   Period Weeks   Status On-going           PT Long Term Goals - 02/14/15 1436    PT LONG TERM GOAL #1   Title Patient will be independent in self management of problem with HEP, home TENS, home traction   Time 8   Period Weeks   Status On-going   PT LONG TERM GOAL #2   Title Patient will report an overall 50% reduction in pain so that she may enjoy interacting with her grandchildren   Time 8   Period Weeks   Status On-going   PT LONG TERM GOAL #3   Title Patient will have improved deep cervical flexor and upper quarter strength needed to lift a jug of milk or grocery bag   Time 8   Period Weeks   Status On-going   PT LONG TERM GOAL #4   Title Bilateral cervical rotation improved to 30 degrees needed for driving   Period Weeks   Status On-going   PT LONG TERM GOAL #5   Title FOTO functional outcome score improved from 65% to 47% indicating improved function with less pain   Time 8   Period Weeks   Status On-going               Plan - 02/14/15 1533    Clinical Impression Statement Pt with no goals achieved due to limited visit.  today is visit # 2.  Pt must sit up and cannot lie on back due to pain  and severe kyphosis.  Pt is anxious about being jerked, but tolerated trigger point dry needling well.  Pt with slight reduction of pain after treatment but still with limited AROM.  Pt educated on importance of thoracic extension to increase  cervical AROm.  Pt  insttructed on neck levator and trap stretch.  Will cont POC   Pt will benefit from skilled therapeutic intervention in order to improve on the following deficits Decreased range of motion;Decreased strength;Pain;Increased muscle spasms   Rehab Potential Good   PT Frequency 2x / week   PT Duration 8 weeks   PT Treatment/Interventions ADLs/Self Care Home Management;Cryotherapy;Electrical Stimulation;Moist Heat;Traction;Ultrasound;Therapeutic exercise;Neuromuscular  re-education;Patient/family education;Manual techniques;Dry needling;Taping   PT Next Visit Plan Assess response to dry needling; seated manual techniques, cervical mobility gentle; modalities, abdominal bracing, doorway stretch next visit   PT Home Exercise Plan levator, trap, neck retraction, wall clock and    Consulted and Agree with Plan of Care Patient        Problem List Patient Active Problem List  Diagnosis Date Noted  . Dyslipidemia   . Neuromuscular disorder   . CTS (carpal tunnel syndrome)     Voncille Lo, PT 02/14/2015 3:39 PM Phone: [phone] Fax: [place] [facility] [address] [place], [place], [address] Phone: [phone]   Fax:  [phone]

## 2015-02-17 ENCOUNTER — Ambulatory Visit: Payer: BLUE CROSS/BLUE SHIELD | Admitting: Physical Therapy

## 2015-02-19 ENCOUNTER — Other Ambulatory Visit: Payer: Self-pay | Admitting: Family Medicine

## 2015-02-22 ENCOUNTER — Ambulatory Visit: Payer: BLUE CROSS/BLUE SHIELD | Attending: Physician Assistant | Admitting: Physical Therapy

## 2015-02-22 DIAGNOSIS — M2560 Stiffness of unspecified joint, not elsewhere classified: Secondary | ICD-10-CM | POA: Insufficient documentation

## 2015-02-22 DIAGNOSIS — G8929 Other chronic pain: Secondary | ICD-10-CM | POA: Insufficient documentation

## 2015-02-22 DIAGNOSIS — M6281 Muscle weakness (generalized): Secondary | ICD-10-CM | POA: Insufficient documentation

## 2015-02-22 DIAGNOSIS — M545 Low back pain, unspecified: Secondary | ICD-10-CM

## 2015-02-22 DIAGNOSIS — M542 Cervicalgia: Secondary | ICD-10-CM | POA: Diagnosis not present

## 2015-02-22 DIAGNOSIS — R293 Abnormal posture: Secondary | ICD-10-CM | POA: Insufficient documentation

## 2015-02-22 NOTE — Therapy (Signed)
[place], [place], [address] Phone: [phone]   Fax:  [phone]  Physical Therapy Treatment  Patient Details  Name: Kelli Moss MRN: [id number] Date of Birth: March 17, 1951 Referring Provider:  Orlena Sheldon, PA-C  Encounter Date: 02/22/2015      PT End of Session - 02/22/15 1219    Visit Number 3   Number of Visits 16   Date for PT Re-Evaluation 04/08/15   Authorization Type BCBS   PT Start Time 0917   PT Stop Time 1032   PT Time Calculation (min) 75 min   Activity Tolerance Patient tolerated treatment well   Behavior During Therapy [facility] for tasks assessed/performed      Past Medical History  Diagnosis Date  . Depression   . Diabetes mellitus without complication     prediabetes  . Dyslipidemia   . Hypertension   . Neuromuscular disorder     peripheral neuropathy  . CTS (carpal tunnel syndrome)   . Gastritis   . Colon polyp     No past surgical history on file.  There were no vitals filed for this visit.  Visit Diagnosis:  Neck pain of over 3 months duration  Bilateral low back pain without sciatica  Joint stiffness of spine  Muscle weakness      Subjective Assessment - 02/22/15 0915    Subjective 2/10 in neck and with movement up to a 7-8/10   Pertinent History peripheral neuropathy   Diagnostic tests Years ago had MRI showed cervical stenosis and lumbar sublux L4-5    Currently in Pain? Yes   Pain Score 2   cna be a 7-8/10 with sharp movements   Pain Location Neck   Pain Orientation Right;Left   Pain Descriptors / Indicators Spasm;Squeezing   Pain Type Chronic pain   Pain Onset More than a month ago   Pain Frequency Constant   Multiple Pain Sites Yes   Pain Score 3   Pain Location Back   Pain Orientation Right   Pain Type Chronic pain   Pain Onset More than a month ago            [facility] PT Assessment - 02/22/15 1003    AROM   Cervical Flexion 20   Cervical Extension 25   Cervical - Right Side Bend 18   Cervical - Left Side Bend 10   Cervical - Right Rotation 25   Cervical - Left Rotation 20   Lumbar Flexion 55   Lumbar Extension 25   Lumbar - Right Side Bend 25   Lumbar - Left Side Bend 25   Palpation   Palpation comment able to pick up trapezius bilaterally                     [facility] Adult PT Treatment/Exercise - 02/22/15 0924    Neck Exercises: Seated   Neck Retraction 5 reps;10 secs   Knee/Hip Exercises: Seated   Knee/Hip Flexion 5 x 10 sec hold for seated hip flexion   Shoulder Exercises: Standing   Other Standing Exercises corner stretch 3 times 30 seconds   Moist Heat Therapy   Number Minutes Moist Heat 12 Minutes   Moist Heat Location Cervical;Lumbar Spine   Manual Therapy   Manual Therapy Soft tissue mobilization   Soft tissue mobilization bilateral upper traps/levator, mid thoracic paraspinals    Myofascial Release IATYM to Right buttocks and paraspinals of lumbar  Trigger Point Dry Needling - 02/22/15 1937    Consent Given? Yes   Education Handout Provided --  previously given   Muscles Treated Upper Body Levator scapulae;Upper trapezius;Rhomboids   Muscles Treated Lower Body --  Lumbar L-3to L-5 erector spinae   Upper Trapezius Response Twitch reponse elicited;Palpable increased muscle length  bila, also thoracic T- 7 to T 9 bilaterally   Levator Scapulae Response Twitch response elicited;Palpable increased muscle length  bilaterally   Rhomboids Response Twitch response elicited;Palpable increased muscle length  bilaterally   Gluteus Maximus Response Twitch response elicited;Palpable increased muscle length  all Right bulltock only   Gluteus Minimus Response Twitch response elicited;Palpable increased muscle length   Piriformis Response Twitch response elicited              PT Education - 02/22/15 1002    Education provided Yes   Education Details Pt educated on  importance of movement and walking for total health.  Pt educated on basic flexion of hips in sitting, chin tuck and corner stretch   Person(s) Educated Patient   Methods Explanation;Demonstration;Verbal cues   Comprehension Verbalized understanding;Returned demonstration          PT Short Term Goals - 02/14/15 1435    PT SHORT TERM GOAL #1   Time 4   Period Weeks   Status On-going   PT SHORT TERM GOAL #2   Title Cervical sidebending AROM improved to 15 degrees needed for mobility with driving and home chores   Time 4   Period Weeks   Status On-going   PT SHORT TERM GOAL #3   Title Patient will demonstrate compliance with basic, initial HEP needed for increased ROM and strength needed for ADLs   Time 4   Period Weeks   Status On-going           PT Long Term Goals - 02/14/15 1436    PT LONG TERM GOAL #1   Title Patient will be independent in self management of problem with HEP, home TENS, home traction   Time 8   Period Weeks   Status On-going   PT LONG TERM GOAL #2   Title Patient will report an overall 50% reduction in pain so that she may enjoy interacting with her grandchildren   Time 8   Period Weeks   Status On-going   PT LONG TERM GOAL #3   Title Patient will have improved deep cervical flexor and upper quarter strength needed to lift a jug of milk or grocery bag   Time 8   Period Weeks   Status On-going   PT LONG TERM GOAL #4   Title Bilateral cervical rotation improved to 30 degrees needed for driving   Period Weeks   Status On-going   PT LONG TERM GOAL #5   Title FOTO functional outcome score improved from 65% to 47% indicating improved function with less pain   Time 8   Period Weeks   Status On-going               Plan - 02/22/15 1220    Clinical Impression Statement Pt presents with neck AROM restrictions and low back pain.  Pt sleeps with 3 pillows and was instructed to try to use less and we are going to attempt to get more comfortable in  a back lying position in order to stretch cervical and thoracic paraspinal.  No goals achieved but AROM of neck flex and ext  and R lateral flexion impoved minimally.  See Assessment.  Pt seems to benefit from trigger point dry needling and tissue extensibility improved  .  Pt with hard end feel with Left side bend of neck.  Will try to improve thoracic mobilty to aid in comfort of neck extension and mobility.   Pt will benefit from skilled therapeutic intervention in order to improve on the following deficits Decreased range of motion;Decreased strength;Pain;Increased muscle spasms   Rehab Potential Good   PT Frequency 2x / week   PT Duration 8 weeks   PT Treatment/Interventions ADLs/Self Care Home Management;Cryotherapy;Electrical Stimulation;Moist Heat;Traction;Ultrasound;Therapeutic exercise;Neuromuscular re-education;Patient/family education;Manual techniques;Dry needling;Taping   PT Next Visit Plan Pt needs to begin exercises for core for back and AROM of neck/scapulara stabilizers.  Pt also is interested in purchasing a TENS unit on a belt   PT Home Exercise Plan corner stretch,added to HEP   Consulted and Agree with Plan of Care Patient        Problem List Patient Active Problem List   Diagnosis Date Noted  . Dyslipidemia   . Neuromuscular disorder   . CTS (carpal tunnel syndrome)    Voncille Lo, PT 02/22/2015 12:28 PM Phone: [phone] Fax: [facility] [address] [place], [place], [address] Phone: [phone]   Fax:  [phone]

## 2015-02-22 NOTE — Patient Instructions (Signed)
FLEXION: Sitting (Active)   Sit, both feet flat. Lift right knee toward ceiling.  Hold for 10 secs right and left x 5 . Perform __3 x day_ sessions per day.Flexibility: Corner Stretch   Standing in corner with hands just above shoulder level and feet _6-8___ inches from corner, lean forward until a comfortable stretch is felt across chest. Hold __25__ seconds. Repeat ___3_ times per set. Do _1___ sets per session. Do _3___ sessions per day.   Axial Extension (Chin Tuck)   Gently pull chin in while lengthening back of neck. Hold __5__ seconds while counting out loud. Repeat _10___ times. Do __2-3__ sessions per day.  Be mindful of chin tuck all day.    Voncille Lo, PT 02/22/2015 10:02 AM Phone: [phone] Fax: [phone]   [email or web address]   Copyright  VHI. All rights reserved.

## 2015-02-25 ENCOUNTER — Ambulatory Visit: Payer: BLUE CROSS/BLUE SHIELD | Admitting: Physical Therapy

## 2015-02-25 DIAGNOSIS — M2560 Stiffness of unspecified joint, not elsewhere classified: Secondary | ICD-10-CM

## 2015-02-25 DIAGNOSIS — M545 Low back pain, unspecified: Secondary | ICD-10-CM

## 2015-02-25 DIAGNOSIS — M542 Cervicalgia: Secondary | ICD-10-CM

## 2015-02-25 DIAGNOSIS — G8929 Other chronic pain: Principal | ICD-10-CM

## 2015-02-25 DIAGNOSIS — M6281 Muscle weakness (generalized): Secondary | ICD-10-CM

## 2015-02-25 NOTE — Patient Instructions (Signed)
Supine decompression exercise per handout 5 reps each daily;   Seated thoracic extension over small ball 5x 2-3 x/day

## 2015-02-25 NOTE — Therapy (Signed)
[place], [place], [address] Phone: [phone]   Fax:  [phone]  Physical Therapy Treatment  Patient Details  Name: Kelli Moss MRN: [id number] Date of Birth: [birth year] Referring Provider:  Orlena Sheldon, PA-C  Encounter Date: 02/25/2015      PT End of Session - 02/25/15 1128    Visit Number 4   Number of Visits 16   Date for PT Re-Evaluation 04/08/15   Authorization Type BCBS   PT Start Time 0800   PT Stop Time 0908   PT Time Calculation (min) 68 min   Activity Tolerance Patient tolerated treatment well      Past Medical History  Diagnosis Date  . Depression   . Diabetes mellitus without complication     prediabetes  . Dyslipidemia   . Hypertension   . Neuromuscular disorder     peripheral neuropathy  . CTS (carpal tunnel syndrome)   . Gastritis   . Colon polyp     No past surgical history on file.  There were no vitals filed for this visit.  Visit Diagnosis:  Neck pain of over 3 months duration  Bilateral low back pain without sciatica  Joint stiffness of spine  Muscle weakness      Subjective Assessment - 02/25/15 0759    Subjective (p) The needling helps relax things.  I got the traction and that's when I do my exercises.     Pain Score (p) 3    Pain Location (p) Neck   Pain Orientation (p) Right;Left   Pain Type (p) Chronic pain   Pain Onset (p) More than a month ago   Pain Frequency (p) Constant   Multiple Pain Sites (p) Yes   Pain Score (p) 2   Pain Location (p) Hip   Pain Orientation (p) Right                         [facility] Adult PT Treatment/Exercise - 02/25/15 0001    Lumbar Exercises: Seated   Other Seated Lumbar Exercises Seated thoracic extension over ball 10x   Lumbar Exercises: Supine   Other Supine Lumbar Exercises decompression exercises on wedge 5 reps each 2-3 sec holds   Moist Heat Therapy   Number Minutes Moist Heat 15  Minutes  seated leaning forward on table   Moist Heat Location Cervical;Lumbar Spine   Manual Therapy   Manual Therapy Joint mobilization;Soft tissue mobilization  performed in prone, no pillow   Joint Mobilization prone P-A T4-T8 grade 2 5x each level   Soft tissue mobilization bilateral upper traps/levator, mid thoracic paraspinals    Myofascial Release bilateral gluteals and piriformis; lumbar multifidi          Trigger Point Dry Needling - 02/25/15 1125    Consent Given? Yes   Muscles Treated Upper Body Upper trapezius;Levator scapulae;Rhomboids  cervical multifidi bilaterally   Muscles Treated Lower Body Gluteus maximus;Piriformis  bilateral lumbar multifidi   Upper Trapezius Response Twitch reponse elicited;Palpable increased muscle length   Levator Scapulae Response Twitch response elicited;Palpable increased muscle length   Rhomboids Response Palpable increased muscle length   Gluteus Maximus Response Palpable increased muscle length   Piriformis Response Palpable increased muscle length;Twitch response elicited     Performed bilaterally in prone.         PT Education - 02/25/15 1127    Education provided Yes   Education Details decompression exercises, postural alignment in sitting,  standing, lying supine and sidelying ; seated thoracic extension over ball   Person(s) Educated Patient   Methods Explanation;Demonstration;Handout   Comprehension Verbalized understanding;Returned demonstration          PT Short Term Goals - 02/25/15 1134    PT SHORT TERM GOAL #1   Title Patient will report a 25% reduction in pain using self management techniques like TENS, home traction, heat,ice   Time 4   Period Weeks   Status On-going   PT SHORT TERM GOAL #2   Title Cervical sidebending AROM improved to 15 degrees needed for mobility with driving and home chores   Time 4   Period Weeks   Status On-going   PT SHORT TERM GOAL #3   Title Patient will demonstrate  compliance with basic, initial HEP needed for increased ROM and strength needed for ADLs   Time 4   Period Weeks   Status On-going           PT Long Term Goals - 02/25/15 1135    PT LONG TERM GOAL #1   Title Patient will be independent in self management of problem with HEP, home TENS, home traction   Time 8   Period Weeks   Status On-going   PT LONG TERM GOAL #2   Title Patient will report an overall 50% reduction in pain so that she may enjoy interacting with her grandchildren   Time 8   Period Weeks   Status On-going   PT LONG TERM GOAL #3   Title Patient will have improved deep cervical flexor and upper quarter strength needed to lift a jug of milk or grocery bag   Time 8   Period Weeks   Status On-going   PT LONG TERM GOAL #4   Title Bilateral cervical rotation improved to 30 degrees needed for driving   Time 8   Period Weeks   Status On-going   PT LONG TERM GOAL #5   Title FOTO functional outcome score improved from 65% to 47% indicating improved function with less pain   Time 8   Period Weeks   Status On-going               Plan - 02/25/15 1129    Clinical Impression Statement Patient reports an overall benefit of better mobility which she attritbutes to dry needling.  She was able to tolerate lying supine with wedge today for exercise and prone for manual interventions which she states she never would have been able to do a few weeks ago.  With further explanation she reports an understanding of the importance of postural alignment and postural strengthening to compliment manual interventions and  dry needling for best results.  The patient expresses being pleased with her progress.     PT Next Visit Plan Try seated manual mobs with movement;  add supine (on wedge) yellow band exercises; continue dry needling (prone no pillow);  continue postural education; moist heat        Problem List Patient Active Problem List   Diagnosis Date Noted  .  Dyslipidemia   . Neuromuscular disorder   . CTS (carpal tunnel syndrome)     Alvera Singh 02/25/2015, 11:37 AM  [place], [place], [address] Phone: [phone]   Fax:  [phone]   Ruben Im, PT 02/25/2015 11:37 AM Phone: [phone] Fax: [phone]

## 2015-02-28 ENCOUNTER — Encounter: Payer: BLUE CROSS/BLUE SHIELD | Admitting: Physical Therapy

## 2015-03-01 ENCOUNTER — Ambulatory Visit: Payer: BLUE CROSS/BLUE SHIELD | Admitting: Physical Therapy

## 2015-03-01 DIAGNOSIS — G8929 Other chronic pain: Principal | ICD-10-CM

## 2015-03-01 DIAGNOSIS — M545 Low back pain, unspecified: Secondary | ICD-10-CM

## 2015-03-01 DIAGNOSIS — M6281 Muscle weakness (generalized): Secondary | ICD-10-CM

## 2015-03-01 DIAGNOSIS — M2560 Stiffness of unspecified joint, not elsewhere classified: Secondary | ICD-10-CM

## 2015-03-01 DIAGNOSIS — M542 Cervicalgia: Secondary | ICD-10-CM

## 2015-03-01 NOTE — Therapy (Signed)
[place], [place], [address] Phone: [phone]   Fax:  [phone]  Physical Therapy Treatment  Patient Details  Name: Kelli Moss MRN: [id number] Date of Birth: 08/25/1950 Referring Provider:  Orlena Sheldon, PA-C  Encounter Date: 03/01/2015      PT End of Session - 03/01/15 1714    Visit Number 5   Number of Visits 16   Date for PT Re-Evaluation 04/08/15   Authorization Type BCBS   PT Start Time 0845   PT Stop Time 0953   PT Time Calculation (min) 68 min   Activity Tolerance Patient tolerated treatment well      Past Medical History  Diagnosis Date  . Depression   . Diabetes mellitus without complication     prediabetes  . Dyslipidemia   . Hypertension   . Neuromuscular disorder     peripheral neuropathy  . CTS (carpal tunnel syndrome)   . Gastritis   . Colon polyp     No past surgical history on file.  There were no vitals filed for this visit.  Visit Diagnosis:  Neck pain of over 3 months duration  Bilateral low back pain without sciatica  Joint stiffness of spine  Muscle weakness      Subjective Assessment - 03/01/15 0847    Subjective It had felt better after treatment but then strained herself picking her 40# grandson out of the car on Saturday.  It feels like my right side is drawing in low back and lumbar/buttock.    I felt so much better though.  I did the decompression exercises.     Currently in Pain? Yes   Pain Score 2    Pain Location Back   Pain Orientation Right   Pain Type Chronic pain   Pain Frequency Constant   Aggravating Factors  lifting   Pain Relieving Factors heat and needling   Pain Score 2   Pain Location Neck   Pain Orientation Right                         [facility] Adult PT Treatment/Exercise - 03/01/15 0001    Exercises   Exercises Neck   Neck Exercises: Seated   Cervical Rotation Both;5 reps  5 finger sweep   Lumbar  Exercises: Supine   Other Supine Lumbar Exercises supine on wedge yellow band scap stabilization exercises 8x each bilaterally   Moist Heat Therapy   Number Minutes Moist Heat 15 Minutes  seated leaning over treatment table   Moist Heat Location Cervical;Lumbar Spine   Manual Therapy   Manual Therapy Joint mobilization;Soft tissue mobilization   Joint Mobilization prone P-A T4-T8 grade 2 5x each level; seated thoracic mob with movement grade 2/3   Soft tissue mobilization bilateral upper traps, cervical multifidi,lumbar paraspinals, gluteals, piriformis          Trigger Point Dry Needling - 03/01/15 1713    Consent Given? Yes   Muscles Treated Upper Body Upper trapezius;Levator scapulae;Rhomboids   Muscles Treated Lower Body Gluteus maximus;Piriformis  bilateral lumbar multifidi   Upper Trapezius Response Twitch reponse elicited;Palpable increased muscle length   Levator Scapulae Response Twitch response elicited;Palpable increased muscle length   Rhomboids Response Palpable increased muscle length   Gluteus Maximus Response Twitch response elicited;Palpable increased muscle length   Piriformis Response Palpable increased muscle length      Right gluteals/pirformis          PT  Short Term Goals - 03/01/15 1721    PT SHORT TERM GOAL #1   Title Patient will report a 25% reduction in pain using self management techniques like TENS, home traction, heat,ice   Baseline 7/22    Time 4   Period Weeks   Status On-going   PT SHORT TERM GOAL #2   Title Cervical sidebending AROM improved to 15 degrees needed for mobility with driving and home chores   Time 4   Period Weeks   Status On-going   PT SHORT TERM GOAL #3   Title Patient will demonstrate compliance with basic, initial HEP needed for increased ROM and strength needed for ADLs   Time 4   Period Weeks   Status On-going           PT Long Term Goals - 03/01/15 1722    PT LONG TERM GOAL #1   Title Patient will be  independent in self management of problem with HEP, home TENS, home traction   Time 8   Period Weeks   Status On-going   PT LONG TERM GOAL #2   Title Patient will report an overall 50% reduction in pain so that she may enjoy interacting with her grandchildren   Time 8   Period Weeks   Status On-going   PT LONG TERM GOAL #3   Title Patient will have improved deep cervical flexor and upper quarter strength needed to lift a jug of milk or grocery bag   Time 8   Period Weeks   Status On-going   PT LONG TERM GOAL #4   Title Bilateral cervical rotation improved to 30 degrees needed for driving   Time 8   Period Weeks   Status On-going   PT LONG TERM GOAL #5   Title FOTO functional outcome score improved from 65% to 47% indicating improved function with less pain   Time 8   Period Weeks   Status On-going               Plan - 03/01/15 1716    Clinical Impression Statement The patient is less fearful of movement and receptive to low level supine scapular strengthening in supine and manual interventions performed in prone.  Patient unable to tolerate these postions initially.  She continues to be very stiff in thoracic spine and would benefit from low intensity joint mobs in this area.  Cervical rotation also very limited and painful as well.  She would benefit from PT 2-3 more weeks to address these deficits.  She may be going out of the [place] around that time.     PT Next Visit Plan Review supine on wedge yellow band scapular stabilization and give as HEP;  ? cervical rotation mobs with movement with towel; continue dry needling prone no pillow;         Problem List Patient Active Problem List   Diagnosis Date Noted  . Dyslipidemia   . Neuromuscular disorder   . CTS (carpal tunnel syndrome)     Alvera Singh 03/01/2015, 5:24 PM  [facility] [address] [place], [place], [address] Phone: [phone]   Fax:   [phone]  Ruben Im, PT 03/01/2015 5:26 PM Phone: [phone] Fax: [phone]

## 2015-03-04 ENCOUNTER — Ambulatory Visit: Payer: BLUE CROSS/BLUE SHIELD | Admitting: Physical Therapy

## 2015-03-04 DIAGNOSIS — M545 Low back pain, unspecified: Secondary | ICD-10-CM

## 2015-03-04 DIAGNOSIS — G8929 Other chronic pain: Principal | ICD-10-CM

## 2015-03-04 DIAGNOSIS — M6281 Muscle weakness (generalized): Secondary | ICD-10-CM

## 2015-03-04 DIAGNOSIS — M2560 Stiffness of unspecified joint, not elsewhere classified: Secondary | ICD-10-CM

## 2015-03-04 DIAGNOSIS — M542 Cervicalgia: Secondary | ICD-10-CM | POA: Diagnosis not present

## 2015-03-04 NOTE — Therapy (Signed)
[place], [place], [address] Phone: [phone]   Fax:  [phone]  Physical Therapy Treatment  Patient Details  Name: Kelli Moss MRN: [id number] Date of Birth: [birth year] Referring Provider:  Orlena Sheldon, PA-C  Encounter Date: 03/04/2015      PT End of Session - 03/04/15 1134    Visit Number 6   Number of Visits 16   Date for PT Re-Evaluation 04/08/15   Authorization Type BCBS   PT Start Time 0926   PT Stop Time 1028   PT Time Calculation (min) 62 min   Activity Tolerance Patient tolerated treatment well      Past Medical History  Diagnosis Date  . Depression   . Diabetes mellitus without complication     prediabetes  . Dyslipidemia   . Hypertension   . Neuromuscular disorder     peripheral neuropathy  . CTS (carpal tunnel syndrome)   . Gastritis   . Colon polyp     No past surgical history on file.  There were no vitals filed for this visit.  Visit Diagnosis:  Neck pain of over 3 months duration  Bilateral low back pain without sciatica  Joint stiffness of spine  Muscle weakness      Subjective Assessment - 03/04/15 0926    Subjective Has been using a small football for thoracic extension in chair.  Lower back 1/10, between shoulder blades 1/10 and upper trap region 2/10 tightness.  Less sore after dry needling than on previous visit.     Currently in Pain? Yes   Pain Score 2    Pain Location Neck   Pain Orientation Right;Left   Pain Onset More than a month ago   Pain Frequency Constant                         [facility] Adult PT Treatment/Exercise - 03/04/15 1129    Self-Care   Self-Care Other Self-Care Comments   Other Self-Care Comments  Patient expresses interest in home TENS garments vs. standard model secondary to great difficult reaching behind back for application.  Discussed she would need MD order for home unit.     Neck Exercises: Supine    Other Supine Exercise yellow band scap stabilization exercises 8-10 reps; extensive discussion on why doing this exercise and expected muscle response   Moist Heat Therapy   Number Minutes Moist Heat 15 Minutes  seated with decreased layers per patient request   Moist Heat Location Cervical;Lumbar Spine   Manual Therapy   Soft tissue mobilization bilateral upper traps, cervical multifidi,lumbar paraspinals, gluteals, piriformis          Trigger Point Dry Needling - 03/04/15 1133    Muscles Treated Upper Body Upper trapezius;Levator scapulae;Rhomboids   Muscles Treated Lower Body Gluteus maximus;Piriformis  bilateral lumbar multifidi   Upper Trapezius Response Twitch reponse elicited;Palpable increased muscle length   Levator Scapulae Response Twitch response elicited;Palpable increased muscle length   Rhomboids Response Palpable increased muscle length   Gluteus Maximus Response Palpable increased muscle length   Piriformis Response Palpable increased muscle length      Right gluteal only; bilateral upper quadrant        PT Education - 03/04/15 1134    Education provided Yes   Education Details supine scapular stab exercises with yellow band   Person(s) Educated Patient   Methods Demonstration;Explanation;Handout   Comprehension Verbalized understanding;Returned demonstration  PT Short Term Goals - 03/04/15 1141    PT SHORT TERM GOAL #1   Title Patient will report a 25% reduction in pain using self management techniques like TENS, home traction, heat,ice   Time 4   Period Weeks   Status On-going   PT SHORT TERM GOAL #2   Title Cervical sidebending AROM improved to 15 degrees needed for mobility with driving and home chores   Time 4   Period Weeks   Status On-going   PT SHORT TERM GOAL #3   Title Patient will demonstrate compliance with basic, initial HEP needed for increased ROM and strength needed for ADLs   Time 4   Period Weeks   Status On-going            PT Long Term Goals - 03/04/15 1141    PT LONG TERM GOAL #1   Title Patient will be independent in self management of problem with HEP, home TENS, home traction   Time 8   Period Weeks   Status On-going   PT LONG TERM GOAL #2   Title Patient will report an overall 50% reduction in pain so that she may enjoy interacting with her grandchildren   Time 8   Period Weeks   Status On-going   PT LONG TERM GOAL #3   Title Patient will have improved deep cervical flexor and upper quarter strength needed to lift a jug of milk or grocery bag   Time 8   Period Weeks   Status On-going   PT LONG TERM GOAL #4   Title Bilateral cervical rotation improved to 30 degrees needed for driving   Time 8   Period Weeks   Status On-going   PT LONG TERM GOAL #5   Title FOTO functional outcome score improved from 65% to 47% indicating improved function with less pain   Time 8   Period Weeks   Status On-going               Plan - 03/04/15 1135    Clinical Impression Statement The patient is now able to participate in supine exercises on a wedge as well as lying prone for dry needling and manual interventions.  She was initially unable to tolerate these positions at all.  Her pain level has significantly improved however her hypomobility in cervical and thoracic spine persist from this long standing problem.  Therapist closely monitoring her pain response throughout treatment session.   PT Next Visit Plan gentle thoracic extension mobs with and without movement; cervical self guided rotation with towel; dry needling in prone no pillow needed; resume cervical multifidi dry needle; moist heat in sitting;  consider holding PT after 2 more visits or decrease visit frequency to once every 2 weeks         Problem List Patient Active Problem List   Diagnosis Date Noted  . Dyslipidemia   . Neuromuscular disorder   . CTS (carpal tunnel syndrome)     Alvera Singh 03/04/2015, 11:45  AM  [place], [place], [address] Phone: [phone]   Fax:  [phone]    Ruben Im, PT 03/04/2015 11:46 AM Phone: [phone] Fax: [phone]

## 2015-03-04 NOTE — Patient Instructions (Signed)
Over Head Pull: Narrow Grip       On back, knees bent, feet flat, band across thighs, elbows straight but relaxed. Pull hands apart (start). Keeping elbows straight, bring arms up and over head, hands toward floor. Keep pull steady on band. Hold momentarily. Return slowly, keeping pull steady, back to start. Repeat __8-10_ times. Band color __yellow____   Side Pull: Double Arm   On back, knees bent, feet flat. Arms perpendicular to body, shoulder level, elbows straight but relaxed. Pull arms out to sides, elbows straight. Resistance band comes across collarbones, hands toward floor. Hold momentarily. Slowly return to starting position. Repeat __8-10_ times. Band color ___yellow__   Sash   On back, knees bent, feet flat, left hand on left hip, right hand above left. Pull right arm DIAGONALLY (hip to shoulder) across chest. Bring right arm along head toward floor. Hold momentarily. Slowly return to starting position. Repeat _8-10__ times. Do with left arm. Band color _yellow_____   Shoulder Rotation: Double Arm   On back, knees bent, feet flat, elbows tucked at sides, bent 90, hands palms up. Pull hands apart and down toward floor, keeping elbows near sides. Hold momentarily. Slowly return to starting position. Repeat _8-10__ times. Band color ____yellow__

## 2015-03-07 ENCOUNTER — Ambulatory Visit: Payer: BLUE CROSS/BLUE SHIELD | Admitting: Physical Therapy

## 2015-03-07 DIAGNOSIS — G8929 Other chronic pain: Principal | ICD-10-CM

## 2015-03-07 DIAGNOSIS — M6281 Muscle weakness (generalized): Secondary | ICD-10-CM

## 2015-03-07 DIAGNOSIS — M542 Cervicalgia: Secondary | ICD-10-CM

## 2015-03-07 DIAGNOSIS — M545 Low back pain, unspecified: Secondary | ICD-10-CM

## 2015-03-07 DIAGNOSIS — R293 Abnormal posture: Secondary | ICD-10-CM

## 2015-03-07 DIAGNOSIS — M2560 Stiffness of unspecified joint, not elsewhere classified: Secondary | ICD-10-CM

## 2015-03-07 NOTE — Therapy (Signed)
[place], [place], [address] Phone: [phone]   Fax:  [phone]  Physical Therapy Treatment  Patient Details  Name: Kelli Moss MRN: [id number] Date of Birth: [birth year] Referring Provider:  Orlena Sheldon, PA-C  Encounter Date: 03/07/2015      PT End of Session - 03/07/15 1524    Visit Number 7   Number of Visits 16   Date for PT Re-Evaluation 04/08/15   Authorization Type BCBS   PT Start Time 0215   PT Stop Time 0320   PT Time Calculation (min) 65 min   Activity Tolerance Patient tolerated treatment well   Behavior During Therapy [facility] for tasks assessed/performed      Past Medical History  Diagnosis Date  . Depression   . Diabetes mellitus without complication     prediabetes  . Dyslipidemia   . Hypertension   . Neuromuscular disorder     peripheral neuropathy  . CTS (carpal tunnel syndrome)   . Gastritis   . Colon polyp     No past surgical history on file.  There were no vitals filed for this visit.  Visit Diagnosis:  Neck pain of over 3 months duration  Bilateral low back pain without sciatica  Joint stiffness of spine  Muscle weakness  Abnormal posture      Subjective Assessment - 03/07/15 1422    Subjective I think I have discovered that when you turn to my left I feel pain on my Right low back.   Pertinent History peripheral neuropathy   Diagnostic tests Years ago had MRI showed cervical stenosis and lumbar sublux L4-5    Patient Stated Goals Feel no pain; enjoying grandchildren 5, 4,3,1 standing to push swing   Currently in Pain? Yes   Pain Score 2    Pain Orientation Right;Left   Pain Descriptors / Indicators Spasm;Aching   Pain Onset More than a month ago   Pain Frequency Constant   Pain Score 2   Pain Location Back   Pain Orientation Right;Mid   Pain Descriptors / Indicators Aching;Spasm   Pain Type Chronic pain                          [facility] Adult PT Treatment/Exercise - 03/07/15 1426    Neck Exercises: Supine   Other Supine Exercise review scapular stablizer exerciser and answered questions.   Moist Heat Therapy   Number Minutes Moist Heat 15 Minutes  with incline on supine able to tolerate today   Moist Heat Location Cervical;Lumbar Spine   Manual Therapy   Manual Therapy Joint mobilization;Soft tissue mobilization   Joint Mobilization Prone PA mobs T-4 to T-8, 1st rib bilaterally sup mob    Soft tissue mobilization bilateral scalenes/SCM with incline on table and bilateral cervical parapsinals and upper traps/levator   Myofascial Release bilateral scalenes and llumbar multifidi    Neck Exercises: Stretches   Upper Trapezius Stretch 2 reps;30 seconds   Upper Trapezius Stretch Limitations ROM/pain    Levator Stretch 2 reps;30 seconds   Levator Stretch Limitations ROM/pain   Other Neck Stretches Pt educated on cervical rotation with hold on scalene with added rotation bilaterally          Trigger Point Dry Needling - 03/07/15 1427    Consent Given? Yes   Muscles Treated Upper Body Upper trapezius;Rhomboids;Levator scapulae   Muscles Treated Lower Body Gluteus maximus;Piriformis  bil lumbar multifidi  Upper Trapezius Response Twitch reponse elicited  Thoracic Right T-4 to T-8   Levator Scapulae Response Twitch response elicited;Palpable increased muscle length   Rhomboids Response Palpable increased muscle length   Gluteus Maximus Response Palpable increased muscle length  right side on ly   Piriformis Response Palpable increased muscle length  right side only              PT Education - 03/07/15 1523    Education provided Yes   Education Details reviewed briefly supine scapular stab exercises with yellow t band and educated on self myofascial release of scalenes bilaterally using mirror for location of muscle and active cervical roation   Person(s) Educated Patient   Methods  Explanation;Demonstration;Tactile cues;Verbal cues   Comprehension Verbalized understanding;Returned demonstration          PT Short Term Goals - 03/07/15 1525    PT SHORT TERM GOAL #1   Title Patient will report a 25% reduction in pain using self management techniques like TENS, home traction, heat,ice   Baseline Pt pain 2/10   Time 4   Period Weeks   Status On-going   PT SHORT TERM GOAL #2   Title Cervical sidebending AROM improved to 15 degrees needed for mobility with driving and home chores   Time 4   Period Weeks   Status On-going   PT SHORT TERM GOAL #3   Title Patient will demonstrate compliance with basic, initial HEP needed for increased ROM and strength needed for ADLs   Baseline Pt able to perform scap stabiizers and neck stretches   Time 4   Period Weeks   Status Achieved           PT Long Term Goals - 03/04/15 1141    PT LONG TERM GOAL #1   Title Patient will be independent in self management of problem with HEP, home TENS, home traction   Time 8   Period Weeks   Status On-going   PT LONG TERM GOAL #2   Title Patient will report an overall 50% reduction in pain so that she may enjoy interacting with her grandchildren   Time 8   Period Weeks   Status On-going   PT LONG TERM GOAL #3   Title Patient will have improved deep cervical flexor and upper quarter strength needed to lift a jug of milk or grocery bag   Time 8   Period Weeks   Status On-going   PT LONG TERM GOAL #4   Title Bilateral cervical rotation improved to 30 degrees needed for driving   Time 8   Period Weeks   Status On-going   PT LONG TERM GOAL #5   Title FOTO functional outcome score improved from 65% to 47% indicating improved function with less pain   Time 8   Period Weeks   Status On-going               Plan - 03/07/15 1528    Clinical Impression Statement Pt able to lie on supine with table inclined elevated to 45 degrees in order to do soft tissue work for [facility] and  scalenes.  Pt able to tolerate prone for cervical TDN as well. Pain level now at 2/10 and pt appreciates significant reduction in pain and increased ability to be able to tolerate various postions/  .  Pt  will benefit from 1 x dry needling next visit and move to more active exercise as pt now has ability to actively participate  Pt will benefit from skilled therapeutic intervention in order to improve on the following deficits Decreased range of motion;Decreased strength;Pain;Increased muscle spasms   PT Frequency 2x / week   PT Duration 8 weeks   PT Treatment/Interventions ADLs/Self Care Home Management;Cryotherapy;Electrical Stimulation;Moist Heat;Traction;Ultrasound;Therapeutic exercise;Neuromuscular re-education;Patient/family education;Manual techniques;Dry needling;Taping   PT Next Visit Plan Assess goals and cervical AROM and dry needling for last time and beging moving onto active exercise.  Pt able to lie prone for needling today. Pt may be able to hold PT after 2 more visits or decrease visit frequesnce to once every 2 weeks.  continue gentle thoreacic ext mobs  and try PNF patterns in sitting and standing.   PT Home Exercise Plan scalenes self MFR with active rotation. and continued initial HEP with scap stablizers with yellow t band and neck stretches.        Problem List Patient Active Problem List   Diagnosis Date Noted  . Dyslipidemia   . Neuromuscular disorder   . CTS (carpal tunnel syndrome)     Voncille Lo, PT 03/07/2015 3:38 PM Phone: [phone] Fax: [facility] [address] [place], [place], [address] Phone: [phone]   Fax:  [phone]

## 2015-03-10 ENCOUNTER — Ambulatory Visit: Payer: BLUE CROSS/BLUE SHIELD | Admitting: Physical Therapy

## 2015-03-10 DIAGNOSIS — M2560 Stiffness of unspecified joint, not elsewhere classified: Secondary | ICD-10-CM

## 2015-03-10 DIAGNOSIS — G8929 Other chronic pain: Principal | ICD-10-CM

## 2015-03-10 DIAGNOSIS — M542 Cervicalgia: Secondary | ICD-10-CM | POA: Diagnosis not present

## 2015-03-10 DIAGNOSIS — M545 Low back pain, unspecified: Secondary | ICD-10-CM

## 2015-03-10 DIAGNOSIS — R293 Abnormal posture: Secondary | ICD-10-CM

## 2015-03-10 DIAGNOSIS — M6281 Muscle weakness (generalized): Secondary | ICD-10-CM

## 2015-03-10 NOTE — Patient Instructions (Signed)
TEN garments info provided at patient request

## 2015-03-10 NOTE — Therapy (Signed)
[place], [place], [address] Phone: [phone]   Fax:  [phone]  Physical Therapy Treatment  Patient Details  Name: Kelli Moss MRN: [id number] Date of Birth: 1951/07/21 Referring Provider:  Orlena Sheldon, PA-C  Encounter Date: 03/10/2015      PT End of Session - 03/10/15 1741    Visit Number 8   Number of Visits 16   Date for PT Re-Evaluation 04/08/15   Authorization Type BCBS   PT Start Time 1101   PT Stop Time 1210   PT Time Calculation (min) 69 min   Activity Tolerance Patient tolerated treatment well      Past Medical History  Diagnosis Date  . Depression   . Diabetes mellitus without complication     prediabetes  . Dyslipidemia   . Hypertension   . Neuromuscular disorder     peripheral neuropathy  . CTS (carpal tunnel syndrome)   . Gastritis   . Colon polyp     No past surgical history on file.  There were no vitals filed for this visit.  Visit Diagnosis:  Neck pain of over 3 months duration  Bilateral low back pain without sciatica  Joint stiffness of spine  Muscle weakness  Abnormal posture      Subjective Assessment - 03/10/15 1111    Subjective Really sore after last time especially in SCM and scalenes.  Less neck pain,  pain upper back.   It is better.  The dry needling relaxes it.  Going to [facility] Wednesday.     Currently in Pain? Yes   Pain Score 2    Pain Location Neck   Pain Orientation Right;Left   Pain Type Chronic pain   Aggravating Factors  turning head   Pain Relieving Factors heat and needling            [facility] PT Assessment - 03/10/15 1113    AROM   Cervical Flexion 20   Cervical Extension 20   Cervical - Right Side Bend 18   Cervical - Left Side Bend 15   Cervical - Right Rotation 22   Cervical - Left Rotation 18   Lumbar Flexion 60   Lumbar Extension 30   Lumbar - Right Side Bend 25   Lumbar - Left Side Bend 25                      [facility] Adult PT Treatment/Exercise - 03/10/15 0001    Neck Exercises: Standing   Other Standing Exercises Against wall cervical retractions and postural alignment   Neck Exercises: Seated   Cervical Isometrics Flexion;Extension;Right lateral flexion;Left lateral flexion;Right rotation;Left rotation;3 secs;1 rep   Neck Retraction 5 reps;10 secs   Moist Heat Therapy   Number Minutes Moist Heat 15 Minutes   Moist Heat Location Cervical;Lumbar Spine  seated leaning forward on table   Manual Therapy   Soft tissue mobilization cervical multifidi, thoracic and lumbar multifidi, upper traps levator scap and right gluteals          Trigger Point Dry Needling - 03/10/15 1737    Consent Given? Yes   Muscles Treated Upper Body Upper trapezius;Levator scapulae  cervical multifidi; thoracic multifidi   Muscles Treated Lower Body Gluteus maximus  lumbar multifidi   Upper Trapezius Response Twitch reponse elicited;Palpable increased muscle length   Levator Scapulae Response Twitch response elicited;Palpable increased muscle length   Gluteus Maximus Response Twitch response elicited;Palpable increased muscle length  Performed bilaterally except gluteals  Right only.         PT Education - 03/10/15 1740    Education provided Yes   Education Details HEP review including postural alignment against wall; cervical isometrics and TENS garments on-line info   Person(s) Educated Patient   Methods Explanation;Demonstration;Handout   Comprehension Verbalized understanding;Returned demonstration          PT Short Term Goals - 03/10/15 1746    PT SHORT TERM GOAL #1   Title Patient will report a 25% reduction in pain using self management techniques like TENS, home traction, heat,ice   Status Achieved   PT SHORT TERM GOAL #2   Title Cervical sidebending AROM improved to 15 degrees needed for mobility with driving and home chores   Time 4   Period Weeks    Status Partially Met   PT SHORT TERM GOAL #3   Title Patient will demonstrate compliance with basic, initial HEP needed for increased ROM and strength needed for ADLs   Status Achieved           PT Long Term Goals - 03/10/15 1118    PT LONG TERM GOAL #1   Title Patient will be independent in self management of problem with HEP, home TENS, home traction   Time 8   Period Weeks   Status Partially Met   PT LONG TERM GOAL #2   Title Patient will report an overall 50% reduction in pain so that she may enjoy interacting with her grandchildren   Time 8   Period Weeks   PT LONG TERM GOAL #3   Title Patient will have improved deep cervical flexor and upper quarter strength needed to lift a jug of milk or grocery bag   Time 8   Period Weeks   Status Partially Met   PT LONG TERM GOAL #4   Title Bilateral cervical rotation improved to 30 degrees needed for driving   Time 8   Period Weeks   Status Partially Met   PT LONG TERM GOAL #5   Title FOTO functional outcome score improved from 65% to 47% indicating improved function with less pain   Time 8   Period Weeks   Status On-going               Plan - 03/10/15 1741    Clinical Impression Statement Although there has been minimal change in cervical AROM, she reports a significant improvement in pain reduction.  Initially the patient was intolerant to supine or prone lying, today she is able to be in both positions and participate in exercises for mobility, strengthening and postural alignment.  She continues to have forward head and increased thoracic kyphosis.  She reports dry needling and manual interventions to be very helpful.  She would benefit from a home TENs unit for pain control and is interested in the garment style apparatus so that she may apply independently.  [place].  She requests to be put on hold.  If > 2 months , she may need a new order from the physician to resume.  Partial  goals met.     PT Next Visit Plan Patient on hold secondary to traveling to [place] for 2 months        Problem List Patient Active Problem List   Diagnosis Date Noted  . Dyslipidemia   . Neuromuscular disorder   . CTS (carpal tunnel syndrome)  Alvera Singh 03/10/2015, 5:48 PM  [facility] [address] [place], [place], [address] Phone: [phone]   Fax:  [phone]  Ruben Im, PT 03/10/2015 5:49 PM Phone: [phone] Fax: [phone]

## 2015-05-18 ENCOUNTER — Telehealth: Payer: Self-pay | Admitting: *Deleted

## 2015-05-18 NOTE — Telephone Encounter (Signed)
Pt calling stating that she is needing a new referral to resume PT to [facility] since it has been >14months per PT office and also states that physical therapist is recommending her to have a TENS unit for pain control and needs a prescriptions written out and pt will come to pick up.  I am placing a new referral to PT   Please advise for TENS unit.

## 2015-05-19 NOTE — Telephone Encounter (Signed)
Wister with rx for tens and pt.

## 2015-05-20 NOTE — Telephone Encounter (Signed)
Spoke to pt and she would like to call around and get the best price on the tens unit and will call us back with where she would like the RX to go.

## 2015-05-27 ENCOUNTER — Other Ambulatory Visit: Payer: Self-pay | Admitting: Physician Assistant

## 2015-05-27 DIAGNOSIS — M542 Cervicalgia: Secondary | ICD-10-CM

## 2015-05-27 DIAGNOSIS — M5489 Other dorsalgia: Secondary | ICD-10-CM

## 2015-05-27 MED ORDER — UNABLE TO FIND: Status: AC

## 2015-06-07 ENCOUNTER — Ambulatory Visit: Payer: BLUE CROSS/BLUE SHIELD | Attending: Physician Assistant | Admitting: Physical Therapy

## 2015-06-07 DIAGNOSIS — M545 Low back pain, unspecified: Secondary | ICD-10-CM

## 2015-06-07 DIAGNOSIS — M6281 Muscle weakness (generalized): Secondary | ICD-10-CM | POA: Diagnosis present

## 2015-06-07 DIAGNOSIS — G8929 Other chronic pain: Secondary | ICD-10-CM | POA: Diagnosis present

## 2015-06-07 DIAGNOSIS — R293 Abnormal posture: Secondary | ICD-10-CM | POA: Diagnosis present

## 2015-06-07 DIAGNOSIS — M2560 Stiffness of unspecified joint, not elsewhere classified: Secondary | ICD-10-CM | POA: Diagnosis present

## 2015-06-07 DIAGNOSIS — M542 Cervicalgia: Secondary | ICD-10-CM | POA: Diagnosis not present

## 2015-06-07 NOTE — Patient Instructions (Signed)
Handout provided for prone head lift/shoulder extension to neutral 8x.  Performed daily.

## 2015-06-07 NOTE — Therapy (Signed)
[place], [place], [address] Phone: [phone]   Fax:  [phone]  Physical Therapy Evaluation  Patient Details  Name: Kelli Moss MRN: [id number] Date of Birth: Dec 03, 1950 Referring Provider: Dena Billet PA  Encounter Date: 06/07/2015      PT End of Session - 06/07/15 1132    Visit Number 1   Number of Visits 8   Date for PT Re-Evaluation 08/02/15   Authorization Type BCBS   PT Start Time 0930   PT Stop Time 1030   PT Time Calculation (min) 60 min   Activity Tolerance Patient tolerated treatment well      Past Medical History  Diagnosis Date  . Depression   . Diabetes mellitus without complication     prediabetes  . Dyslipidemia   . Hypertension   . Neuromuscular disorder     peripheral neuropathy  . CTS (carpal tunnel syndrome)   . Gastritis   . Colon polyp     No past surgical history on file.  There were no vitals filed for this visit.  Visit Diagnosis:  Neck pain of over 3 months duration - Plan: PT plan of care cert/re-cert  Bilateral low back pain without sciatica - Plan: PT plan of care cert/re-cert  Joint stiffness of spine - Plan: PT plan of care cert/re-cert  Muscle weakness - Plan: PT plan of care cert/re-cert  Abnormal posture - Plan: PT plan of care cert/re-cert      Subjective Assessment - 06/07/15 0926    Subjective Felt like a charlie horse started in my left > right upper trap muscles. Walking will make the pain go down right thigh.  While in [place] pain returned and it felt like an electric zing.  No precipitating factor.  The dry needling helped in the past.  My head feels heavy.  Posterior headaches, I wake up with them 3x/week.    Pertinent History peripheral neuropathy   How long can you walk comfortably? 5 min will make it go down the right leg   Patient Stated Goals Feel no pain; enjoying grandchildren 5, 4,3,1 standing to push swing; more mobility in  neck and not be stone hard   Currently in Pain? Yes   Pain Score 3    Pain Location Neck   Pain Orientation Right;Left   Pain Descriptors / Indicators Headache;Heaviness   Pain Type Chronic pain   Pain Onset More than a month ago   Aggravating Factors  picking up heavy things, even a gallon of milk;     Pain Relieving Factors the dry needling helps with the hardness in my muscles;  straightening up and extending spine while seated.  Home TENS    Pain Score 3   Pain Location Back   Pain Orientation Right   Pain Type Chronic pain   Pain Onset More than a month ago   Pain Frequency Constant   Aggravating Factors  Lifting, walking   Pain Relieving Factors no activity;  lyiing down with legs elevated            [facility] PT Assessment - 06/07/15 0935    Assessment   Medical Diagnosis neck and back pain   Referring Provider Dena Billet PA   Onset Date/Surgical Date 05/27/15   Hand Dominance Right   Next MD Visit not scheduled   Prior Therapy summer 2016   Precautions   Precautions None   Restrictions   Weight Bearing Restrictions No  Balance Screen   Has the patient fallen in the past 6 months No   Has the patient had a decrease in activity level because of a fear of falling?  Yes   Is the patient reluctant to leave their home because of a fear of falling?  No   Home Environment   Living Environment Private residence   Living Arrangements Spouse/significant other   Available Help at Discharge Family   Type of Equality to enter   Home Layout Laundry or work area in [employer] None   Prior Function   Level of [place] Retired   Leisure be with grandkids; [employer]   Observation/Other Assessments   Focus on Therapeutic Outcomes (FOTO)  57% limit    Posture/Postural Control   Posture/Postural Control Postural limitations   Postural Limitations Rounded Shoulders;Forward head;Increased thoracic kyphosis    ROM / Strength   AROM / PROM / Strength AROM   AROM   Cervical Flexion 22   Cervical Extension 15   Cervical - Right Side Bend 15   Cervical - Left Side Bend 15   Cervical - Right Rotation 25   Cervical - Left Rotation 25   Lumbar Flexion 60   Lumbar Extension 10   Lumbar - Right Side Bend 26   Lumbar - Left Side Bend 30   Palpation   Palpation comment decreased muscle length upper trap                   [facility] Adult PT Treatment/Exercise - 06/07/15 0935    Neck Exercises: Prone   Axial Exentsion 10 reps   Shoulder Extension 10 reps   Moist Heat Therapy   Number Minutes Moist Heat 20 Minutes   Moist Heat Location Cervical;Lumbar Spine          Trigger Point Dry Needling - 06/07/15 1134    Consent Given? Yes   Muscles Treated Upper Body Upper trapezius;Levator scapulae  superficial thoracic paraspinals B   Upper Trapezius Response Twitch reponse elicited;Palpable increased muscle length   Levator Scapulae Response Twitch response elicited;Palpable increased muscle length     Performed bilaterally         PT Education - 06/07/15 1131    Education provided Yes   Education Details prone head lift/UE lift;  review of dry needling after care and precautions including risk of pneumothorax   Person(s) Educated Patient   Methods Explanation;Handout   Comprehension Verbalized understanding;Returned demonstration          PT Short Term Goals - 06/07/15 1540    PT SHORT TERM GOAL #1   Title The patient will report a 25% improvement in pain and function using postural correction techniques, ROM and strengthening exercises   Time 4   Period Weeks   Status New   PT SHORT TERM GOAL #2   Title Cervical rotation AROM improved to 30 degrees needed for mobility with driving and home chores   Time 3   Period Weeks   Status New           PT Long Term Goals - 06/07/15 1542    PT LONG TERM GOAL #1   Title Patient will be independent in safe, self  progression of HEP for further improvements in posture, ROM and strength   Period Weeks   Status New   PT LONG TERM GOAL #2   Title Patient will report an  overall 50% reduction in pain so that she may enjoy interacting with her grandchildren   Time 8   Period Weeks   Status On-going   PT LONG TERM GOAL #3   Title Patient will have improved deep cervical flexor and upper quarter strength to 4-/5 needed to lift a jug of milk or grocery bag   Time 8   Period Weeks   Status On-going   PT LONG TERM GOAL #4   Title Bilateral cervical rotation improved to 35 degrees needed for driving   Time 8   Period Weeks   Status On-going   PT LONG TERM GOAL #5   Title FOTO functional outcome score improved from 57% to 47% indicating improved function with less pain   Time 8   Period Weeks   Status On-going               Plan - 06/07/15 1135    Clinical Impression Statement The patient is a 64 year old female with a long history of neck pain, mid back and lower back pain.  She had an exacerbation about 2 weeks ago while visiting her family in [place].  She reports a zing down her arm and a "Charlie Horse" cramp in bilateral upper trap regions.  She has a posterior headache about 3x/week.  Her pain is worsened with lifting things (even a gallon of milk).  She had previous PT in last Spring/early Summer and responded well.  Her lumbar AROM: flex 60, ext 10, right sidebend 26, left 30;  Cervical AROM:  flex 22, ext 15, right and left sidebend 15, right and left rotation 25 degrees.  Some movement loss noted from previous PT admission.  Decreased muscle length bilateral upper traps.  Thoracic joint hyopmobility.  Forward head, increased thoracic kyphosis.  Decreased milddle and lower trap strength 3/5.  She would benefit from PT to address these deficits.   Pt will benefit from skilled therapeutic intervention in order to improve on the following deficits Decreased range of motion;Decreased  strength;Pain;Increased muscle spasms;Impaired flexibility   Rehab Potential Good   PT Frequency 1x / week   PT Treatment/Interventions ADLs/Self Care Home Management;Cryotherapy;Electrical Stimulation;Moist Heat;Traction;Ultrasound;Therapeutic exercise;Neuromuscular re-education;Patient/family education;Manual techniques;Dry needling;Taping   PT Next Visit Plan assess response to dry needling, add lumbar multifidi as needed;  thoracic extension mobs;  assess prone head lift/UE lift and add HABD shoulder with head lift         Problem List Patient Active Problem List   Diagnosis Date Noted  . Dyslipidemia   . Neuromuscular disorder ([place])   . CTS (carpal tunnel syndrome)     Alvera Singh 06/07/2015, 5:30 PM  [place], [place], [address] Phone: [phone]   Fax:  [phone]  Name: Kelli Moss MRN: [id number] Date of Birth: [birth year]  Ruben Im, PT 06/07/2015 5:31 PM Phone: [phone] Fax: [phone]

## 2015-06-17 ENCOUNTER — Ambulatory Visit: Payer: BLUE CROSS/BLUE SHIELD | Admitting: Physical Therapy

## 2015-06-17 ENCOUNTER — Encounter: Payer: BLUE CROSS/BLUE SHIELD | Admitting: Physical Therapy

## 2015-06-23 ENCOUNTER — Ambulatory Visit: Payer: BLUE CROSS/BLUE SHIELD | Attending: Physician Assistant | Admitting: Physical Therapy

## 2015-06-23 DIAGNOSIS — M545 Low back pain, unspecified: Secondary | ICD-10-CM

## 2015-06-23 DIAGNOSIS — M2560 Stiffness of unspecified joint, not elsewhere classified: Secondary | ICD-10-CM | POA: Diagnosis present

## 2015-06-23 DIAGNOSIS — R293 Abnormal posture: Secondary | ICD-10-CM

## 2015-06-23 DIAGNOSIS — M6281 Muscle weakness (generalized): Secondary | ICD-10-CM | POA: Insufficient documentation

## 2015-06-23 DIAGNOSIS — M542 Cervicalgia: Secondary | ICD-10-CM | POA: Diagnosis present

## 2015-06-23 DIAGNOSIS — G8929 Other chronic pain: Secondary | ICD-10-CM | POA: Insufficient documentation

## 2015-06-24 NOTE — Therapy (Signed)
[place], [place], [address] Phone: [phone]   Fax:  [phone]  Physical Therapy Treatment  Patient Details  Name: Kelli Moss MRN: [id number] Date of Birth: 1951-01-22 Referring Provider: Dena Billet PA  Encounter Date: 06/23/2015      PT End of Session - 06/24/15 0626    Visit Number 2   Number of Visits 8   Date for PT Re-Evaluation 08/02/15   Authorization Type BCBS   PT Start Time 1330   PT Stop Time 1430   PT Time Calculation (min) 60 min   Activity Tolerance Patient tolerated treatment well      Past Medical History  Diagnosis Date  . Depression   . Diabetes mellitus without complication     prediabetes  . Dyslipidemia   . Hypertension   . Neuromuscular disorder     peripheral neuropathy  . CTS (carpal tunnel syndrome)   . Gastritis   . Colon polyp     No past surgical history on file.  There were no vitals filed for this visit.  Visit Diagnosis:  Neck pain of over 3 months duration  Bilateral low back pain without sciatica  Joint stiffness of spine  Muscle weakness  Abnormal posture      Subjective Assessment - 06/23/15 1323    Subjective (p) Missed last week since taking care of 17 and 108 year old grandchildren.  Between shoulder blades pain.  I've been sitting with a cone ball in that spot and that helps.  Neck and right hip too.     Currently in Pain? (p) Yes   Pain Location (p) Scapula   Pain Orientation (p) Right;Left   Pain Type (p) Chronic pain   Pain Onset (p) More than a month ago   Pain Frequency (p) Constant   Aggravating Factors  (p) lifting grandchildren    Pain Relieving Factors (p) cone apparatus                          [facility] Adult PT Treatment/Exercise - 06/23/15 1417    Neck Exercises: Seated   Other Seated Exercise Review of previous HEP and stressed importance   Other Seated Exercise Seated thoracic extension with ball  10x   Moist Heat Therapy   Number Minutes Moist Heat 15 Minutes   Moist Heat Location Cervical;Lumbar Spine   Manual Therapy   Manual Therapy Other (comment)   Joint Mobilization Prone PA mobs T-4 to T-8, 1st rib bilaterally sup mob    Soft tissue mobilization cervical multifidi, thoracic multifidi, upper traps levator scap and right gluteals   Other Manual Therapy Seated mob with movement with manual overpressure2x 10 grade 3          Trigger Point Dry Needling - 06/23/15 1420    Consent Given? Yes   Muscles Treated Upper Body Upper trapezius;Levator scapulae;Rhomboids  superficial thoracic paraspinals   Upper Trapezius Response Palpable increased muscle length   Levator Scapulae Response Palpable increased muscle length   Rhomboids Response Palpable increased muscle length      Performed bilaterally          PT Short Term Goals - 06/24/15 0630    PT SHORT TERM GOAL #1   Title The patient will report a 25% improvement in pain and function using postural correction techniques, ROM and strengthening exercises   Time 4   Period Weeks   Status On-going   PT  SHORT TERM GOAL #2   Title Cervical rotation AROM improved to 30 degrees needed for mobility with driving and home chores   Time 3   Period Weeks   Status On-going           PT Long Term Goals - 06/24/15 0631    PT LONG TERM GOAL #1   Title Patient will be independent in safe, self progression of HEP for further improvements in posture, ROM and strength   Time 8   Period Weeks   Status On-going   PT LONG TERM GOAL #2   Title Patient will report an overall 50% reduction in pain so that she may enjoy interacting with her grandchildren   Time 8   Period Weeks   Status On-going   PT LONG TERM GOAL #3   Title Patient will have improved deep cervical flexor and upper quarter strength to 4-/5 needed to lift a jug of milk or grocery bag   Time 8   Period Weeks   Status On-going   PT LONG TERM GOAL #4   Title  Bilateral cervical rotation improved to 35 degrees needed for driving   Time 8   Period Weeks   Status On-going   PT LONG TERM GOAL #5   Title FOTO functional outcome score improved from 57% to 47% indicating improved function with less pain   Time 8   Period Weeks   Status On-going               Plan - 06/24/15 0350    Clinical Impression Statement Primary complaint of pain mid thoracic and neck.  Thoracic spine hypomobile.  Patient has lower back rash/skin changes so manual and dry needliing avoided in this region.  Patient reports good compliance with HEP.  Therapist closely monitoring response throughout treatment session.  Continue with treatment plan.     PT Next Visit Plan assess response to dry needling, add lumbar multifidi as needed;  thoracic extension mobs;  standing wall postural exercises        Problem List Patient Active Problem List   Diagnosis Date Noted  . Dyslipidemia   . Neuromuscular disorder ([place])   . CTS (carpal tunnel syndrome)     Alvera Singh 06/24/2015, 6:33 AM  [place], [place], [address] Phone: [phone]   Fax:  [phone]  Name: Kelli Moss MRN: [id number] Date of Birth: September 14, 1950  Ruben Im, PT 06/24/2015 6:33 AM Phone: [phone] Fax: [phone]

## 2015-06-30 ENCOUNTER — Ambulatory Visit: Payer: BLUE CROSS/BLUE SHIELD | Admitting: Physical Therapy

## 2015-07-07 ENCOUNTER — Ambulatory Visit: Payer: BLUE CROSS/BLUE SHIELD | Admitting: Physical Therapy

## 2015-07-07 DIAGNOSIS — M545 Low back pain, unspecified: Secondary | ICD-10-CM

## 2015-07-07 DIAGNOSIS — R293 Abnormal posture: Secondary | ICD-10-CM

## 2015-07-07 DIAGNOSIS — M6281 Muscle weakness (generalized): Secondary | ICD-10-CM

## 2015-07-07 DIAGNOSIS — G8929 Other chronic pain: Principal | ICD-10-CM

## 2015-07-07 DIAGNOSIS — M542 Cervicalgia: Secondary | ICD-10-CM | POA: Diagnosis not present

## 2015-07-07 DIAGNOSIS — M2560 Stiffness of unspecified joint, not elsewhere classified: Secondary | ICD-10-CM

## 2015-07-08 NOTE — Therapy (Signed)
[place], [place], [address] Phone: [phone]   Fax:  [phone]  Physical Therapy Treatment  Patient Details  Name: Kelli Moss MRN: [id number] Date of Birth: [birth year] Referring Provider: Dena Billet PA  Encounter Date: 07/07/2015      PT End of Session - 07/07/15 1339    Visit Number 3   Number of Visits 8   Date for PT Re-Evaluation 08/02/15   Authorization Type BCBS   PT Start Time [id number]   PT Stop Time 1115   PT Time Calculation (min) 60 min   Activity Tolerance Patient tolerated treatment well      Past Medical History  Diagnosis Date  . Depression   . Diabetes mellitus without complication     prediabetes  . Dyslipidemia   . Hypertension   . Neuromuscular disorder     peripheral neuropathy  . CTS (carpal tunnel syndrome)   . Gastritis   . Colon polyp     No past surgical history on file.  There were no vitals filed for this visit.  Visit Diagnosis:  Neck pain of over 3 months duration  Bilateral low back pain without sciatica  Joint stiffness of spine  Muscle weakness  Abnormal posture      Subjective Assessment - 07/07/15 1015    Subjective I would rather not have come today, not feeling well in general.  Joint pain in general.   Currently in Pain? Yes   Pain Score 4    Pain Location Neck   Pain Orientation Right;Left   Pain Type Chronic pain   Pain Score 4   Pain Location Back   Pain Orientation Right   Pain Type Chronic pain                         [facility] Adult PT Treatment/Exercise - 07/08/15 0001    Lumbar Exercises: Supine   Other Supine Lumbar Exercises Discussed using swim noodle in supine for postural realignment/pectoral stretch   Other Supine Lumbar Exercises Review of current HEP   Moist Heat Therapy   Number Minutes Moist Heat 15 Minutes   Moist Heat Location Cervical;Lumbar Spine   Manual Therapy   Joint Mobilization Prone  PA mobs T-4 to T-8   Soft tissue mobilization cervical multifidi, thoracic multifidi, upper traps levator scap and right gluteals   Other Manual Therapy Seated mob with movement with manual overpressure2x 10 grade 3          Trigger Point Dry Needling - 07/07/15 1338    Consent Given? Yes   Muscles Treated Lower Body Gluteus maximus  B lumbar multifidi   Upper Trapezius Response Twitch reponse elicited;Palpable increased muscle length   Levator Scapulae Response Palpable increased muscle length   Rhomboids Response Palpable increased muscle length   Gluteus Maximus Response Palpable increased muscle length                PT Short Term Goals - 07/07/15 1400    PT SHORT TERM GOAL #1   Title The patient will report a 25% improvement in pain and function using postural correction techniques, ROM and strengthening exercises   Time 4   Period Weeks   Status On-going   PT SHORT TERM GOAL #2   Title Cervical rotation AROM improved to 30 degrees needed for mobility with driving and home chores   Time 3   Period Weeks   Status On-going  PT SHORT TERM GOAL #3   Title Patient will demonstrate compliance with basic, initial HEP needed for increased ROM and strength needed for ADLs   Status Achieved           PT Long Term Goals - 07/08/15 1129    PT LONG TERM GOAL #1   Title Patient will be independent in safe, self progression of HEP for further improvements in posture, ROM and strength   Time 8   Period Weeks   Status On-going   PT LONG TERM GOAL #2   Title Patient will report an overall 50% reduction in pain so that she may enjoy interacting with her grandchildren   Time 8   Period Weeks   Status On-going   PT LONG TERM GOAL #3   Title Patient will have improved deep cervical flexor and upper quarter strength to 4-/5 needed to lift a jug of milk or grocery bag   Time 8   Period Weeks   Status On-going   PT LONG TERM GOAL #4   Title Bilateral cervical rotation  improved to 35 degrees needed for driving   Time 8   Period Weeks   Status On-going   PT LONG TERM GOAL #5   Title FOTO functional outcome score improved from 57% to 47% indicating improved function with less pain   Time 8   Period Weeks   Status On-going               Plan - 07/07/15 1340    Clinical Impression Statement The patient reports good pain relief from manual therapy and dry needling.  Thoracic spine continues to be hypomobile.  No additional progress toward goals secondary to chronicity of problem.   She brings in her 1/2 ball device she uses at home for extension and postural correction.   Therapist closely monitoring response during all interventions.      PT Next Visit Plan assess response to dry needling, add lumbar multifidi as needed;  thoracic extension mobs;  standing wall postural exercise; recheck cervical AROM and progress toward goals        Problem List Patient Active Problem List   Diagnosis Date Noted  . Dyslipidemia   . Neuromuscular disorder ([place])   . CTS (carpal tunnel syndrome)     Alvera Singh 07/08/2015, 11:32 AM  [place], [place], [address] Phone: [phone]   Fax:  [phone]  Name: Kelli Moss MRN: [id number] Date of Birth: April 11, 1951

## 2015-07-12 ENCOUNTER — Ambulatory Visit: Payer: BLUE CROSS/BLUE SHIELD | Admitting: Physical Therapy

## 2015-07-12 DIAGNOSIS — M2560 Stiffness of unspecified joint, not elsewhere classified: Secondary | ICD-10-CM

## 2015-07-12 DIAGNOSIS — M545 Low back pain, unspecified: Secondary | ICD-10-CM

## 2015-07-12 DIAGNOSIS — M542 Cervicalgia: Secondary | ICD-10-CM | POA: Diagnosis not present

## 2015-07-12 DIAGNOSIS — G8929 Other chronic pain: Principal | ICD-10-CM

## 2015-07-12 DIAGNOSIS — R293 Abnormal posture: Secondary | ICD-10-CM

## 2015-07-12 NOTE — Therapy (Signed)
[place], [place], [address] Phone: [phone]   Fax:  [phone]  Physical Therapy Treatment  Patient Details  Name: Kelli Moss MRN: [id number] Date of Birth: 07-15-1951 Referring Provider: Dena Billet PA  Encounter Date: 07/12/2015      PT End of Session - 07/12/15 1159    Visit Number 4   Number of Visits 8   Date for PT Re-Evaluation 08/02/15   Authorization Type BCBS   PT Start Time 1100   PT Stop Time 1210   PT Time Calculation (min) 70 min   Activity Tolerance Patient tolerated treatment well      Past Medical History  Diagnosis Date  . Depression   . Diabetes mellitus without complication     prediabetes  . Dyslipidemia   . Hypertension   . Neuromuscular disorder     peripheral neuropathy  . CTS (carpal tunnel syndrome)   . Gastritis   . Colon polyp     No past surgical history on file.  There were no vitals filed for this visit.  Visit Diagnosis:  Neck pain of over 3 months duration  Bilateral low back pain without sciatica  Joint stiffness of spine  Abnormal posture      Subjective Assessment - 07/12/15 1107    Subjective I really noticed my lower back is less 2/10.  Interscapular pain is better too.  Left upper trap region pain is bothering.  Neck pain 2/10.   Larry popped my back this morning.     Currently in Pain? Yes   Pain Score 3    Pain Location Neck   Pain Orientation Right;Left   Pain Onset More than a month ago   Pain Frequency Constant   Pain Score 2   Pain Location Back   Pain Orientation Right            [facility] PT Assessment - 07/12/15 1112    AROM   Cervical Flexion 15   Cervical Extension 10   Cervical - Right Side Bend 10   Cervical - Left Side Bend 10   Cervical - Right Rotation 10   Cervical - Left Rotation 10   Lumbar Flexion 65   Lumbar Extension 20   Lumbar - Right Side Bend 30   Lumbar - Left Side Bend 32                      [facility] Adult PT Treatment/Exercise - 07/12/15 0001    Neck Exercises: Seated   Other Seated Exercise Mulligan SNAG towel assisted rotation 10x R/L   Other Seated Exercise Review HEP   Moist Heat Therapy   Number Minutes Moist Heat 15 Minutes   Moist Heat Location Cervical;Lumbar Spine   Manual Therapy   Joint Mobilization Prone PA mobs T-4 to T-8   Soft tissue mobilization cervical multifidi, thoracic multifidi, upper traps levator scap and right gluteals          Trigger Point Dry Needling - 07/12/15 1157    Consent Given? Yes   Muscles Treated Upper Body Suboccipitals muscle group  B cervical multifidi   Muscles Treated Lower Body --  B lumbar multifidi   Upper Trapezius Response Twitch reponse elicited;Palpable increased muscle length   Levator Scapulae Response Twitch response elicited;Palpable increased muscle length   Rhomboids Response Palpable increased muscle length     Performed bilaterally         PT Education -  07/12/15 1159    Education provided Yes   Education Details [place] SNAG towel assisted cervical rotation   Person(s) Educated Patient   Methods Explanation;Demonstration;Handout   Comprehension Verbalized understanding;Returned demonstration          PT Short Term Goals - 07/12/15 1111    PT SHORT TERM GOAL #1   Title The patient will report a 25% improvement in pain and function using postural correction techniques, ROM and strengthening exercises   Baseline 40% 07/12/15   Time 4   Period Weeks   Status Achieved   PT SHORT TERM GOAL #2   Title Cervical rotation AROM improved to 30 degrees needed for mobility with driving and home chores   Time 3   Period Weeks   Status On-going   PT SHORT TERM GOAL #3   Title Patient will demonstrate compliance with basic, initial HEP needed for increased ROM and strength needed for ADLs   Status Achieved           PT Long Term Goals - 07/12/15 1204    PT LONG TERM  GOAL #1   Title Patient will be independent in safe, self progression of HEP for further improvements in posture, ROM and strength   Time 8   Period Weeks   Status On-going   PT LONG TERM GOAL #2   Title Patient will report an overall 50% reduction in pain so that she may enjoy interacting with her grandchildren   Time 8   Period Weeks   Status Partially Met   PT LONG TERM GOAL #3   Title Patient will have improved deep cervical flexor and upper quarter strength to 4-/5 needed to lift a jug of milk or grocery bag   Time 8   Period Weeks   Status On-going   PT LONG TERM GOAL #4   Title Bilateral cervical rotation improved to 35 degrees needed for driving   Time 8   Period Weeks   Status On-going   PT LONG TERM GOAL #5   Title FOTO functional outcome score improved from 57% to 47% indicating improved function with less pain   Time 8   Period Weeks   Status On-going               Plan - 07/12/15 1200    Clinical Impression Statement Patient reports she is 40% better.   Progressing with short term and long term goals.   She reports she is able to lift light to medium objects with greater ease.  She is quite stiff today with cervical AROM but lumbar AROM is much improved.  Improved cervical mobility after treatment session.  She reports good benefit from home cervical traction and home postural exercises (especially in prone).     PT Next Visit Plan add abdominal bracing series;  dry needling; postural exercises;  assess response to cervical rotation SNAGS with towel        Problem List Patient Active Problem List   Diagnosis Date Noted  . Dyslipidemia   . Neuromuscular disorder ([place])   . CTS (carpal tunnel syndrome)     Alvera Singh 07/12/2015, 12:06 PM  [facility] [address] [place], [place], [address] Phone: [phone]   Fax:  [phone]  Name: Kelli Moss MRN: [id number] Date of Birth:  08-05-1951  Ruben Im, PT 07/12/2015 12:06 PM Phone: [phone] Fax: [phone]

## 2015-07-19 ENCOUNTER — Ambulatory Visit: Payer: BLUE CROSS/BLUE SHIELD | Admitting: Physical Therapy

## 2015-07-19 DIAGNOSIS — M542 Cervicalgia: Secondary | ICD-10-CM

## 2015-07-19 DIAGNOSIS — M545 Low back pain, unspecified: Secondary | ICD-10-CM

## 2015-07-19 DIAGNOSIS — R293 Abnormal posture: Secondary | ICD-10-CM

## 2015-07-19 DIAGNOSIS — M6281 Muscle weakness (generalized): Secondary | ICD-10-CM

## 2015-07-19 DIAGNOSIS — G8929 Other chronic pain: Principal | ICD-10-CM

## 2015-07-19 DIAGNOSIS — M2560 Stiffness of unspecified joint, not elsewhere classified: Secondary | ICD-10-CM

## 2015-07-19 NOTE — Therapy (Signed)
[place], [place], [address] Phone: [phone]   Fax:  [phone]  Physical Therapy Treatment  Patient Details  Name: Kelli Moss MRN: [id number] Date of Birth: Nov 24, 1950 Referring Provider: Dena Billet PA  Encounter Date: 07/19/2015      PT End of Session - 07/19/15 1737    Visit Number 5   Number of Visits 8   Date for PT Re-Evaluation 08/02/15   Authorization Type BCBS   PT Start Time 1050   PT Stop Time 1155   PT Time Calculation (min) 65 min   Activity Tolerance Patient tolerated treatment well      Past Medical History  Diagnosis Date  . Depression   . Diabetes mellitus without complication     prediabetes  . Dyslipidemia   . Hypertension   . Neuromuscular disorder     peripheral neuropathy  . CTS (carpal tunnel syndrome)   . Gastritis   . Colon polyp     No past surgical history on file.  There were no vitals filed for this visit.  Visit Diagnosis:  Neck pain of over 3 months duration  Bilateral low back pain without sciatica  Joint stiffness of spine  Abnormal posture  Muscle weakness      Subjective Assessment - 07/19/15 1050    Subjective Feeling OK.  The worst is the left neck and upper trap region.  About 2 weeks ago hit side of head getting into the car.  Interscapular pain 2/10.  60% better overall.  Able to lift a gallon of milk in right hand but not left.  Better with towel rotation.     Currently in Pain? Yes   Pain Score 2    Pain Location Neck   Pain Orientation Left   Pain Onset More than a month ago   Pain Frequency Constant   Aggravating Factors  pick up heavy things, gallon of milk   Pain Relieving Factors dry needling                          [facility] Adult PT Treatment/Exercise - 07/19/15 1105    Lumbar Exercises: Supine   Ab Set 10 reps   Bent Knee Raise 10 reps   Isometric Hip Flexion 10 reps   Moist Heat Therapy   Number  Minutes Moist Heat 15 Minutes   Moist Heat Location Cervical;Lumbar Spine   Manual Therapy   Soft tissue mobilization cervical multifidi, thoracic multifidi, upper traps levator scap and right gluteals   Myofascial Release Graston instrument assisted G4 to thoracic and periscapular muscles, upper traps          Trigger Point Dry Needling - 07/19/15 1735    Consent Given? Yes   Muscles Treated Upper Body Rhomboids;Subscapularis  B cervical multifidi   Upper Trapezius Response Twitch reponse elicited;Palpable increased muscle length   Levator Scapulae Response Twitch response elicited;Palpable increased muscle length      Performed bilaterally          PT Short Term Goals - 07/19/15 1746    PT SHORT TERM GOAL #1   Title The patient will report a 25% improvement in pain and function using postural correction techniques, ROM and strengthening exercises   Status Achieved   PT SHORT TERM GOAL #2   Title Cervical rotation AROM improved to 30 degrees needed for mobility with driving and home chores   Time 3   Period  Weeks   Status On-going   PT SHORT TERM GOAL #3   Title Patient will demonstrate compliance with basic, initial HEP needed for increased ROM and strength needed for ADLs   Status Achieved           PT Long Term Goals - 07/19/15 1746    PT LONG TERM GOAL #1   Title Patient will be independent in safe, self progression of HEP for further improvements in posture, ROM and strength   Time 8   Period Weeks   Status On-going   PT LONG TERM GOAL #2   Title Patient will report an overall 50% reduction in pain so that she may enjoy interacting with her grandchildren   Time 8   Period Weeks   Status Partially Met   PT LONG TERM GOAL #3   Title Patient will have improved deep cervical flexor and upper quarter strength to 4-/5 needed to lift a jug of milk or grocery bag   Time 8   Period Weeks   Status On-going   PT LONG TERM GOAL #4   Title Bilateral cervical  rotation improved to 35 degrees needed for driving   Time 8   Period Weeks   Status On-going   PT LONG TERM GOAL #5   Title FOTO functional outcome score improved from 57% to 47% indicating improved function with less pain   Time 8   Period Weeks   Status On-going               Plan - 07/19/15 1741    Clinical Impression Statement The patient reports good pain relief from dry needling and Graston instrument assisted soft tissue work.  She is progressing fairly well considering the chronicity of her problem.  Much improved soft tissue length in upper traps and function is improving.  Probable discharge in 2-3 visits.     PT Next Visit Plan assess response to abdominal brace series;  continue dry needling as needed;  postural strengthening        Problem List Patient Active Problem List   Diagnosis Date Noted  . Dyslipidemia   . Neuromuscular disorder ([place])   . CTS (carpal tunnel syndrome)     Alvera Singh 07/19/2015, 5:47 PM  [facility] [address] [place], [place], [address] Phone: [phone]   Fax:  [phone]  Name: Kelli Moss MRN: [id number] Date of Birth: [birth year]   Ruben Im, PT 07/19/2015 5:48 PM Phone: [phone] Fax: [phone]

## 2015-07-26 ENCOUNTER — Ambulatory Visit: Payer: BLUE CROSS/BLUE SHIELD | Attending: Physician Assistant | Admitting: Physical Therapy

## 2015-07-26 DIAGNOSIS — M545 Low back pain, unspecified: Secondary | ICD-10-CM

## 2015-07-26 DIAGNOSIS — M6281 Muscle weakness (generalized): Secondary | ICD-10-CM | POA: Diagnosis present

## 2015-07-26 DIAGNOSIS — G8929 Other chronic pain: Secondary | ICD-10-CM | POA: Insufficient documentation

## 2015-07-26 DIAGNOSIS — M2560 Stiffness of unspecified joint, not elsewhere classified: Secondary | ICD-10-CM | POA: Diagnosis present

## 2015-07-26 DIAGNOSIS — M542 Cervicalgia: Secondary | ICD-10-CM

## 2015-07-26 DIAGNOSIS — R293 Abnormal posture: Secondary | ICD-10-CM

## 2015-07-26 NOTE — Therapy (Signed)
[place], [place], [address] Phone: [phone]   Fax:  [phone]  Physical Therapy Treatment  Patient Details  Name: Kelli Moss MRN: [id number] Date of Birth: 06-Mar-1951 Referring Provider: Dena Billet PA  Encounter Date: 07/26/2015      PT End of Session - 07/26/15 1109    Visit Number 6   Number of Visits 8   Date for PT Re-Evaluation 08/02/15   Authorization Type BCBS   PT Start Time 1100   PT Stop Time 1200   PT Time Calculation (min) 60 min   Activity Tolerance Patient tolerated treatment well      Past Medical History  Diagnosis Date  . Depression   . Diabetes mellitus without complication     prediabetes  . Dyslipidemia   . Hypertension   . Neuromuscular disorder     peripheral neuropathy  . CTS (carpal tunnel syndrome)   . Gastritis   . Colon polyp     No past surgical history on file.  There were no vitals filed for this visit.  Visit Diagnosis:  Neck pain of over 3 months duration  Bilateral low back pain without sciatica  Joint stiffness of spine  Abnormal posture      Subjective Assessment - 07/26/15 1058    Subjective I was "doing the twisting yesterday and now the 2 muscles in the right side of the neck are bothering me."  That's the worst thing today and in between my shoulders.  Some right low back pain.      Currently in Pain? Yes   Pain Score 4    Pain Location Neck   Pain Orientation Right   Pain Type Chronic pain   Pain Frequency Constant   Aggravating Factors  picking up the milk    Pain Score 4   Pain Location Scapula   Pain Orientation Right;Left                         [facility] Adult PT Treatment/Exercise - 07/26/15 1106    Moist Heat Therapy   Number Minutes Moist Heat 15 Minutes   Moist Heat Location Cervical;Lumbar Spine   Manual Therapy   Soft tissue mobilization cervical multifidi, thoracic multifidi, upper traps levator  scap and right gluteals  lumbar multifidi   Myofascial Release Graston instrument assisted G4 to thoracic and periscapular muscles, upper traps          Trigger Point Dry Needling - 07/26/15 1107    Consent Given? Yes   Muscles Treated Upper Body --  C multifidi B   Muscles Treated Lower Body --  lumbar multifidi B   Upper Trapezius Response Twitch reponse elicited;Palpable increased muscle length   Levator Scapulae Response Twitch response elicited;Palpable increased muscle length   Rhomboids Response Palpable increased muscle length    Performed bilaterally.            PT Short Term Goals - 07/26/15 1511    PT SHORT TERM GOAL #1   Title The patient will report a 25% improvement in pain and function using postural correction techniques, ROM and strengthening exercises   Status Achieved   PT SHORT TERM GOAL #2   Title Cervical rotation AROM improved to 30 degrees needed for mobility with driving and home chores   Time 3   Period Weeks   Status On-going   PT SHORT TERM GOAL #3   Title Patient will demonstrate  compliance with basic, initial HEP needed for increased ROM and strength needed for ADLs   Status Achieved           PT Long Term Goals - 07/26/15 1512    PT LONG TERM GOAL #1   Title Patient will be independent in safe, self progression of HEP for further improvements in posture, ROM and strength   Time 8   Period Weeks   Status On-going   PT LONG TERM GOAL #2   Title Patient will report an overall 50% reduction in pain so that she may enjoy interacting with her grandchildren   Time 8   Period Weeks   Status Partially Met   PT LONG TERM GOAL #3   Title Patient will have improved deep cervical flexor and upper quarter strength to 4-/5 needed to lift a jug of milk or grocery bag   Time 8   Period Weeks   Status On-going   PT LONG TERM GOAL #4   Title Bilateral cervical rotation improved to 35 degrees needed for driving   Time 8   Period Weeks    Status On-going   PT LONG TERM GOAL #5   Title FOTO functional outcome score improved from 57% to 47% indicating improved function with less pain   Time 8   Period Weeks   Status On-going               Plan - 07/26/15 1508    Clinical Impression Statement The patient continues to neck pain, interscapular pain and low back pain variable in intensity.  She reports temporary relief with dry needling and manual techniques.  Recent exacerbation of right upper trap region pain while doing her cervical rotation ROM exercises.  Recheck progress toward goals.  She may be approaching max benefit at this time.     PT Next Visit Plan check progress toward goals including ROM;  dry needling and manual techniques;  FOTO; discharge?        Problem List Patient Active Problem List   Diagnosis Date Noted  . Dyslipidemia   . Neuromuscular disorder ([place])   . CTS (carpal tunnel syndrome)     Alvera Singh 07/26/2015, 3:14 PM  [place], [place], [address] Phone: [phone]   Fax:  [phone]  Name: Kelli Moss MRN: [id number] Date of Birth: 07-30-1951   Ruben Im, PT 07/26/2015 3:15 PM Phone: [phone] Fax: [phone]

## 2015-08-02 ENCOUNTER — Ambulatory Visit: Payer: BLUE CROSS/BLUE SHIELD | Admitting: Physical Therapy

## 2015-08-02 DIAGNOSIS — R293 Abnormal posture: Secondary | ICD-10-CM

## 2015-08-02 DIAGNOSIS — G8929 Other chronic pain: Principal | ICD-10-CM

## 2015-08-02 DIAGNOSIS — M545 Low back pain, unspecified: Secondary | ICD-10-CM

## 2015-08-02 DIAGNOSIS — M542 Cervicalgia: Secondary | ICD-10-CM | POA: Diagnosis not present

## 2015-08-02 DIAGNOSIS — M2560 Stiffness of unspecified joint, not elsewhere classified: Secondary | ICD-10-CM

## 2015-08-02 DIAGNOSIS — M6281 Muscle weakness (generalized): Secondary | ICD-10-CM

## 2015-08-02 NOTE — Therapy (Signed)
[place], [place], [address] Phone: [phone]   Fax:  [phone]  Physical Therapy Treatment/Discharge Summary  Patient Details  Name: Kelli Moss MRN: [id number] Date of Birth: [birth year] Referring Provider: Dena Billet PA  Encounter Date: 08/02/2015      PT End of Session - 08/02/15 1114    Visit Number 7   Number of Visits 8   Date for PT Re-Evaluation 08/02/15   Authorization Type BCBS   PT Start Time 1108   PT Stop Time 1220   PT Time Calculation (min) 72 min   Activity Tolerance Patient tolerated treatment well      Past Medical History  Diagnosis Date  . Depression   . Diabetes mellitus without complication     prediabetes  . Dyslipidemia   . Hypertension   . Neuromuscular disorder     peripheral neuropathy  . CTS (carpal tunnel syndrome)   . Gastritis   . Colon polyp     No past surgical history on file.  There were no vitals filed for this visit.  Visit Diagnosis:  Neck pain of over 3 months duration  Bilateral low back pain without sciatica  Joint stiffness of spine  Abnormal posture  Muscle weakness      Subjective Assessment - 08/02/15 1111    Subjective Arrives 6 min late.  Less painful in neck.  Interscapular pain and right low back pain.  Less pain picking up things.   Currently in Pain? Yes   Pain Score 4    Pain Location Neck   Pain Orientation Right;Left   Pain Type Chronic pain            [facility] PT Assessment - 08/02/15 1119    Observation/Other Assessments   Focus on Therapeutic Outcomes (FOTO)  63% limited   ROM / Strength   AROM / PROM / Strength --  Cervical flexor, ext, abdom and trunk ext 4-/5   AROM   Cervical Flexion 24   Cervical Extension 16   Cervical - Right Side Bend 20   Cervical - Left Side Bend 11   Cervical - Right Rotation 17   Cervical - Left Rotation 17   Lumbar Flexion 70   Lumbar Extension 25   Lumbar - Right  Side Bend 30   Lumbar - Left Side Bend 35                     [facility] Adult PT Treatment/Exercise - 08/02/15 0001    Moist Heat Therapy   Number Minutes Moist Heat 15 Minutes   Moist Heat Location Cervical   Manual Therapy   Soft tissue mobilization cervical multifidi, thoracic multifidi, upper traps levator scap and right gluteals  lumbar multifidi   Myofascial Release Graston instrument assisted G4 to thoracic and periscapular muscles, upper traps          Trigger Point Dry Needling - 08/02/15 1319    Consent Given? Yes   Muscles Treated Lower Body --  Right lumbar multifidi   Upper Trapezius Response Twitch reponse elicited;Palpable increased muscle length   Levator Scapulae Response Palpable increased muscle length   Rhomboids Response Palpable increased muscle length   Gluteus Minimus Response Palpable increased muscle length   Piriformis Response Twitch response elicited;Palpable increased muscle length       Performed bilaterally         PT Short Term Goals - 08/02/15 1325    PT  SHORT TERM GOAL #1   Title The patient will report a 25% improvement in pain and function using postural correction techniques, ROM and strengthening exercises   Status Achieved   PT SHORT TERM GOAL #2   Title Cervical rotation AROM improved to 30 degrees needed for mobility with driving and home chores   Status Partially Met   PT SHORT TERM GOAL #3   Title Patient will demonstrate compliance with basic, initial HEP needed for increased ROM and strength needed for ADLs   Status Achieved           PT Long Term Goals - 08/02/15 1325    PT LONG TERM GOAL #1   Title Patient will be independent in safe, self progression of HEP for further improvements in posture, ROM and strength   Status Achieved   PT LONG TERM GOAL #2   Title Patient will report an overall 50% reduction in pain so that she may enjoy interacting with her grandchildren   Status Achieved   PT LONG TERM  GOAL #3   Title Patient will have improved deep cervical flexor and upper quarter strength to 4-/5 needed to lift a jug of milk or grocery bag   Status Achieved   PT LONG TERM GOAL #4   Title Bilateral cervical rotation improved to 35 degrees needed for driving   Status Partially Met   PT LONG TERM GOAL #5   Title FOTO functional outcome score improved from 57% to 47% indicating improved function with less pain   Status Not Met               Plan - 08/02/15 1321    Clinical Impression Statement The patient reports her overall pain level for this chronic pain problem has been mild recently.  Her lumbar AROM has significantly improved and cervical AROM improved in most planes although rotation still limited.  She has been instructed in a HEP for ROM and postural strengthening.  She has met the majority of goals and her progress has plateaued at this point.  Recommend discharge from PT.     [place]  Visits from Start of Care: 7  Current functional level related to goals / functional outcomes: See clinical impressions above   Remaining deficits: See above   Education / Equipment: HEP Plan: Patient agrees to discharge.  Patient goals were partially met. Patient is being discharged due to meeting the stated rehab goals.  ?????       Problem List Patient Active Problem List   Diagnosis Date Noted  . Dyslipidemia   . Neuromuscular disorder ([place])   . CTS (carpal tunnel syndrome)   Ruben Im, PT 08/02/2015 1:29 PM Phone: [phone] Fax: [phone]  Alvera Singh 08/02/2015, 1:27 PM  [facility] [address] [place], [place], [address] Phone: [phone]   Fax:  [phone]  Name: Kelli Moss MRN: [id number] Date of Birth: 1951-02-20

## 2015-08-16 ENCOUNTER — Other Ambulatory Visit: Payer: Self-pay | Admitting: Family Medicine

## 2015-09-20 ENCOUNTER — Other Ambulatory Visit: Payer: Self-pay | Admitting: Family Medicine

## 2015-09-26 ENCOUNTER — Telehealth: Payer: Self-pay | Admitting: Physician Assistant

## 2015-09-26 NOTE — Telephone Encounter (Signed)
LORazepam (ATIVAN) 1 MG tablet  Patient is requesting this to be sent to [facility] on [address]  She is requesting 90 day supply   CB# [phone]

## 2015-09-27 NOTE — Telephone Encounter (Signed)
Providers do not fill 90 day supplies of controlled medications.   Patient has not been seen in >1year.   Requires office visit before any further refills can be given.

## 2015-09-29 NOTE — Telephone Encounter (Signed)
LVM asking patient to return my call to advise her of the information below.

## 2015-10-03 ENCOUNTER — Encounter: Payer: Self-pay | Admitting: Family Medicine

## 2015-10-03 ENCOUNTER — Other Ambulatory Visit: Payer: Self-pay | Admitting: Family Medicine

## 2015-10-03 ENCOUNTER — Ambulatory Visit (INDEPENDENT_AMBULATORY_CARE_PROVIDER_SITE_OTHER): Payer: BLUE CROSS/BLUE SHIELD | Admitting: Family Medicine

## 2015-10-03 ENCOUNTER — Ambulatory Visit: Payer: Self-pay | Admitting: Physician Assistant

## 2015-10-03 VITALS — BP 160/80 | HR 78 | Temp 98.2°F | Resp 18 | Ht 66.5 in | Wt 255.0 lb

## 2015-10-03 DIAGNOSIS — Z1159 Encounter for screening for other viral diseases: Secondary | ICD-10-CM | POA: Diagnosis not present

## 2015-10-03 DIAGNOSIS — E538 Deficiency of other specified B group vitamins: Secondary | ICD-10-CM

## 2015-10-03 DIAGNOSIS — I10 Essential (primary) hypertension: Secondary | ICD-10-CM | POA: Diagnosis not present

## 2015-10-03 DIAGNOSIS — Z0000 Encounter for general adult medical examination without abnormal findings: Secondary | ICD-10-CM

## 2015-10-03 DIAGNOSIS — R7303 Prediabetes: Secondary | ICD-10-CM | POA: Diagnosis not present

## 2015-10-03 DIAGNOSIS — F4321 Adjustment disorder with depressed mood: Secondary | ICD-10-CM

## 2015-10-03 DIAGNOSIS — E669 Obesity, unspecified: Secondary | ICD-10-CM

## 2015-10-03 DIAGNOSIS — E8881 Metabolic syndrome: Secondary | ICD-10-CM | POA: Diagnosis not present

## 2015-10-03 LAB — CBC WITH DIFFERENTIAL/PLATELET
Basophils Absolute: 0 10*3/uL (ref 0.0–0.1)
Basophils Relative: 0 % (ref 0–1)
Eosinophils Absolute: 0.1 10*3/uL (ref 0.0–0.7)
Eosinophils Relative: 2 % (ref 0–5)
HCT: 40.1 % (ref 36.0–46.0)
Hemoglobin: 13.5 g/dL (ref 12.0–15.0)
Lymphocytes Relative: 28 % (ref 12–46)
Lymphs Abs: 1.9 10*3/uL (ref 0.7–4.0)
MCH: 28.2 pg (ref 26.0–34.0)
MCHC: 33.7 g/dL (ref 30.0–36.0)
MCV: 83.9 fL (ref 78.0–100.0)
MPV: 9.3 fL (ref 8.6–12.4)
Monocytes Absolute: 0.4 10*3/uL (ref 0.1–1.0)
Monocytes Relative: 6 % (ref 3–12)
Neutro Abs: 4.3 10*3/uL (ref 1.7–7.7)
Neutrophils Relative %: 64 % (ref 43–77)
Platelets: 208 10*3/uL (ref 150–400)
RBC: 4.78 MIL/uL (ref 3.87–5.11)
RDW: 14.7 % (ref 11.5–15.5)
WBC: 6.7 10*3/uL (ref 4.0–10.5)

## 2015-10-03 LAB — LIPID PANEL
Cholesterol: 144 mg/dL (ref 125–200)
HDL: 26 mg/dL — ABNORMAL LOW (ref >=46)
LDL Cholesterol: 77 mg/dL (ref <130)
Total CHOL/HDL Ratio: 5.5 Ratio — ABNORMAL HIGH (ref <=5.0)
Triglycerides: 207 mg/dL — ABNORMAL HIGH (ref <150)
VLDL: 41 mg/dL — ABNORMAL HIGH (ref <30)

## 2015-10-03 LAB — COMPLETE METABOLIC PANEL WITH GFR
ALT: 18 U/L (ref 6–29)
AST: 19 U/L (ref 10–35)
Albumin: 4.3 g/dL (ref 3.6–5.1)
Alkaline Phosphatase: 62 U/L (ref 33–130)
BUN: 12 mg/dL (ref 7–25)
CO2: 25 mmol/L (ref 20–31)
Calcium: 9.3 mg/dL (ref 8.6–10.4)
Chloride: 103 mmol/L (ref 98–110)
Creat: 0.58 mg/dL (ref 0.50–0.99)
GFR, Est African American: >89 mL/min (ref >=60)
GFR, Est Non African American: >89 mL/min (ref >=60)
Glucose, Bld: 99 mg/dL (ref 70–99)
Potassium: 4 mmol/L (ref 3.5–5.3)
Sodium: 140 mmol/L (ref 135–146)
Total Bilirubin: 0.5 mg/dL (ref 0.2–1.2)
Total Protein: 7.1 g/dL (ref 6.1–8.1)

## 2015-10-03 LAB — HEMOGLOBIN A1C
Hgb A1c MFr Bld: 5.5 % (ref <5.7)
Mean Plasma Glucose: 111 mg/dL (ref <117)

## 2015-10-03 MED ORDER — EFINACONAZOLE 10 % EX SOLN: CUTANEOUS | Status: AC

## 2015-10-03 MED ORDER — HYDROCOD POLST-CPM POLST ER 10-8 MG/5ML PO SUER: 5.0000 mL | Freq: Two times a day (BID) | ORAL | Status: DC | PRN

## 2015-10-03 MED ORDER — CYANOCOBALAMIN 1000 MCG/ML IJ SOLN: 1000.0000 ug | INTRAMUSCULAR | Status: DC

## 2015-10-03 MED ORDER — LORAZEPAM 1 MG PO TABS: 1.0000 mg | ORAL_TABLET | Freq: Every evening | ORAL | Status: DC | PRN

## 2015-10-03 MED ORDER — TIZANIDINE HCL 4 MG PO TABS
4.0000 mg | ORAL_TABLET | Freq: Three times a day (TID) | ORAL | Status: DC | PRN
Start: 2015-10-03 — End: 2017-06-07

## 2015-10-03 MED ORDER — CYANOCOBALAMIN 1000 MCG/ML IJ SOLN
1000.0000 ug | Freq: Once | INTRAMUSCULAR | Status: AC
  Administered 2015-10-03: 1000 ug via INTRAMUSCULAR

## 2015-10-03 NOTE — Addendum Note (Signed)
Addended by: Shary Decamp B on: 10/03/2015 10:16 AM   Modules accepted: Orders

## 2015-10-03 NOTE — Progress Notes (Signed)
Subjective:    Patient ID: Kelli Moss, female    DOB: 10/17/1950, 65 y.o.   MRN: [id number]  HPI 05/10/14 Patient is a 65 year old white female who is here today for complete physical exam. She sees her gynecologist for her pelvic exam and Pap smear. She has a history of total abdominal hysterectomy. She does still have her right ovary. Her colonoscopy is up to date. She will see her gastroenterologist in November.  Her mammogram is up to date. She is not yet due for her pneumonia vaccines. Her tetanus vaccine is up to date. She is due for an annual flu shot today. Her blood pressure is well-controlled at 132/74. Unfortunately he continues to be an issue for this patient. Chest diabetes as well as dyslipidemia and metabolic syndrome. She has a difficult time exercising due to severe pain in her neck stemming from osteoarthritis of the neck. She has tried changing her diet but has been unsuccessful in losing weight.  At that time, my plan was: prior to today's office visit, I had reviewed her lab work from her gastroenterologist which included CBC, CMP, and fasting lipid panel. Labs are significant for stable prediabetes as well as dyslipidemia and low HDL cholesterol. We discussed increasing aerobic exercise to address this. We also discussed weight loss. I have recommended that the patient try belviq 10 mg by mouth twice a day to help facilitate weight loss. I also like to see her increase aerobic exercise. Recheck in 6 months. Patient also received a flu vaccine today. The remainder of her preventive care is up to date. Patient will see gynecologist for Pap smear and pelvic exam.   10/03/15 Mother was recently diagnosed with pancreatic cancer and also bilateral pulmonary embolisms. She was placed on hospice last week, but died this morning.  Patient is distraught and tearful. Her blood pressure significantly elevated however she is obviously grieving and dealing with severe anxiety. She is asking  for refill on her lorazepam. She has not had a refill since 2015. She needs something to help her sleep and to help her calm down while she is dealing with the loss of her mother. She is also overdue for a Pap smear as well as a physical exam. She would like a physical exam today. She is also requesting a Pap smear. Past Medical History  Diagnosis Date  . Depression   . Diabetes mellitus without complication     prediabetes  . Dyslipidemia   . Hypertension   . Neuromuscular disorder     peripheral neuropathy  . CTS (carpal tunnel syndrome)   . Gastritis   . Colon polyp    No past surgical history on file. Current Outpatient Prescriptions on File Prior to Visit  Medication Sig Dispense Refill  . amitriptyline (ELAVIL) 25 MG tablet TAKE 4 TABLETS BY MOUTH AT BEDTIME 360 tablet 2  . amoxicillin (AMOXIL) 875 MG tablet Take 1 tablet (875 mg total) by mouth 2 (two) times daily. 20 tablet 0  . aspirin 81 MG tablet Take 81 mg by mouth daily.    . benazepril (LOTENSIN) 40 MG tablet TAKE 1 TABLET BY MOUTH EVERY DAY 90 tablet 4  . ciclopirox (PENLAC) 8 % solution Apply topically at bedtime. Apply over nail and surrounding skin. Apply daily over previous coat. After seven (7) days, may remove with alcohol and continue cycle. 6.6 mL 5  . CINNAMON PO Take 2,000 mg by mouth daily.    . ciprofloxacin (CIPRO) 500 MG tablet  Take 1 tablet (500 mg total) by mouth 2 (two) times daily. 6 tablet 0  . cyanocobalamin (,VITAMIN B-12,) 1000 MCG/ML injection Inject 1 mL (1,000 mcg total) into the muscle every 30 (thirty) days. 30 mL 5  . Flaxseed, Linseed, (FLAX SEEDS PO) Take 1 tablet by mouth daily.    Marland Kitchen LORazepam (ATIVAN) 1 MG tablet TAKE 1 TABLET BY MOUTH AT BEDTIME AS NEEDED 90 tablet 0  . Lorcaserin HCl (BELVIQ) 10 MG TABS Take 1 tablet by mouth 2 (two) times daily. 60 tablet 2  . UNABLE TO FIND Med Name: TENS UNIT DX: M54.2; M54.9 1 Units 0   No current facility-administered medications on file prior to  visit.   Allergies  Allergen Reactions  . Lyrica [Pregabalin]     nightmares  . Neurontin [Gabapentin]     Very whoozy   Social History   Social History  . Marital Status: Married    Spouse Name: N/A  . Number of Children: N/A  . Years of Education: N/A   Occupational History  . Not on file.   Social History Main Topics  . Smoking status: Former Smoker    Quit date: 01/23/1988  . Smokeless tobacco: Never Used  . Alcohol Use: No  . Drug Use: No  . Sexual Activity: Not on file   Other Topics Concern  . Not on file   Social History Narrative   No family history on file.    Review of Systems  All other systems reviewed and are negative.      Objective:   Physical Exam  Constitutional: She is oriented to person, place, and time. She appears well-developed and well-nourished. No distress.  HENT:  Head: Normocephalic and atraumatic.  Right Ear: External ear normal.  Left Ear: External ear normal.  Nose: Nose normal.  Mouth/Throat: Oropharynx is clear and moist. No oropharyngeal exudate.  Eyes: Conjunctivae and EOM are normal. Pupils are equal, round, and reactive to light. No scleral icterus.  Neck: Normal range of motion. Neck supple. No JVD present. No tracheal deviation present. No thyromegaly present.  Cardiovascular: Normal rate, regular rhythm and normal heart sounds.  Exam reveals no gallop and no friction rub.   No murmur heard. Pulmonary/Chest: Effort normal and breath sounds normal. No stridor. No respiratory distress. She has no wheezes. She has no rales. She exhibits no tenderness.  Abdominal: Soft. Bowel sounds are normal. She exhibits no distension and no mass. There is no tenderness. There is no rebound and no guarding.  Musculoskeletal: Normal range of motion. She exhibits no edema or tenderness.  Lymphadenopathy:    She has no cervical adenopathy.  Neurological: She is alert and oriented to person, place, and time. She has normal reflexes. No  cranial nerve deficit. She exhibits normal muscle tone. Coordination normal.  Skin: Skin is warm. No rash noted. She is not diaphoretic. No erythema. No pallor.  Psychiatric: Her behavior is normal. Judgment and thought content normal. Her mood appears anxious. Her affect is labile.  Vitals reviewed.         Assessment & Plan:  Obesity  Benign essential HTN  Metabolic syndrome - Plan: CBC with Differential/Platelet, COMPLETE METABOLIC PANEL WITH GFR, Lipid panel, Hemoglobin A1c  Need for hepatitis C screening test - Plan: Hepatitis C Ab Reflex HCV RNA, QUANT  Prediabetes  Routine general medical examination at a health care facility  Grief reaction  Pap smear was performed and sent to pathology. Her blood pressures extremely high today but  the patient is very emotional and distraught which could be causing her blood pressure to be elevated. She is due for a CBC, CMP, fasting lipid panel as well as a hemoglobin A1c to monitor her prediabetes. 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  I did refill her Ativan to help with the acute grief reaction. We discussed possibly starting something for depression but she is very hesitant to change any medication at the present time. She discontinued amitriptyline because she did not feel that it was helping with her neuropathy anymore. She may benefit from taking Cymbalta to help with peripheral neuropathy as well as to help with depression. I personally believe she would benefit from taking a combination of Cymbalta and gabapentin together for the neuropathy in her legs. However the patient is hesitant to change anything at the present time. Given her emotional state this morning, I did not feel it was the appropriate time to discuss her obesity and weight loss. I will await the results of lab work and discuss with her with the lab work after she has had a chance to mourn her mother.

## 2015-10-04 LAB — PAP, THIN PREP, IMAGING, MEDICARE

## 2015-10-04 LAB — HEPATITIS C ANTIBODY: HCV Ab: NEGATIVE

## 2015-12-05 NOTE — Telephone Encounter (Signed)
Reviewed appt desk and saw patient was seen on 10/03/15.  Closing this encounter

## 2015-12-15 ENCOUNTER — Other Ambulatory Visit: Payer: Self-pay | Admitting: Family Medicine

## 2015-12-15 DIAGNOSIS — Z1231 Encounter for screening mammogram for malignant neoplasm of breast: Secondary | ICD-10-CM

## 2015-12-21 ENCOUNTER — Ambulatory Visit (HOSPITAL_COMMUNITY): Payer: BLUE CROSS/BLUE SHIELD

## 2015-12-22 ENCOUNTER — Ambulatory Visit (HOSPITAL_COMMUNITY)
Admission: RE | Admit: 2015-12-22 | Discharge: 2015-12-22 | Disposition: A | Discharge status: Home / Self Care | Payer: Medicare Other | Source: Ambulatory Visit | Attending: Family Medicine | Admitting: Family Medicine

## 2015-12-22 DIAGNOSIS — Z1231 Encounter for screening mammogram for malignant neoplasm of breast: Secondary | ICD-10-CM

## 2015-12-29 ENCOUNTER — Other Ambulatory Visit: Payer: Self-pay | Admitting: Family Medicine

## 2015-12-29 MED ORDER — CYANOCOBALAMIN 1000 MCG/ML IJ SOLN: 1000.0000 ug | INTRAMUSCULAR | Status: DC

## 2016-03-20 ENCOUNTER — Encounter: Payer: Self-pay | Admitting: Family Medicine

## 2016-03-20 ENCOUNTER — Ambulatory Visit (INDEPENDENT_AMBULATORY_CARE_PROVIDER_SITE_OTHER): Payer: Medicare Other | Admitting: Family Medicine

## 2016-03-20 DIAGNOSIS — M542 Cervicalgia: Secondary | ICD-10-CM

## 2016-03-20 MED ORDER — DIAZEPAM 10 MG PO TABS
10.0000 mg | ORAL_TABLET | Freq: Three times a day (TID) | ORAL | 0 refills | Status: DC | PRN
Start: 2016-03-20 — End: 2016-05-10

## 2016-03-20 MED ORDER — PREDNISONE 20 MG PO TABS: ORAL_TABLET | ORAL | 0 refills | Status: DC

## 2016-03-20 MED ORDER — AMLODIPINE BESYLATE 10 MG PO TABS: 10.0000 mg | ORAL_TABLET | Freq: Every day | ORAL | 3 refills | Status: DC
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
## 2016-03-30 ENCOUNTER — Telehealth: Payer: Self-pay | Admitting: Family Medicine

## 2016-03-30 NOTE — Telephone Encounter (Signed)
Pt called [facility] and states that the pain is better after finishing the steroid - she is using the muscle relaxer's and tens unit along with taking tylenol but she wanted to let you know that she was doing better.

## 2016-04-17 ENCOUNTER — Telehealth: Payer: Self-pay | Admitting: Family Medicine

## 2016-04-17 NOTE — Telephone Encounter (Signed)
Chart notes faxed

## 2016-04-17 NOTE — Telephone Encounter (Signed)
Ok to send records
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
## 2016-05-09 ENCOUNTER — Other Ambulatory Visit: Payer: Self-pay | Admitting: Family Medicine

## 2016-05-09 NOTE — Telephone Encounter (Signed)
Approved. # [phone] [id number] 0. 

## 2016-05-09 NOTE — Telephone Encounter (Signed)
Ok to refill??  Last office visit/ refill 03/20/2016.

## 2016-05-10 ENCOUNTER — Other Ambulatory Visit: Payer: Self-pay | Admitting: *Deleted

## 2016-05-10 MED ORDER — DIAZEPAM 10 MG PO TABS: 10.0000 mg | ORAL_TABLET | Freq: Three times a day (TID) | ORAL | 2 refills | Status: DC | PRN

## 2016-05-21 ENCOUNTER — Ambulatory Visit (INDEPENDENT_AMBULATORY_CARE_PROVIDER_SITE_OTHER): Payer: Medicare Other | Admitting: Family Medicine

## 2016-05-21 ENCOUNTER — Ambulatory Visit
Admission: RE | Admit: 2016-05-21 | Discharge: 2016-05-21 | Disposition: A | Discharge status: Home / Self Care | Payer: Medicare Other | Source: Ambulatory Visit | Attending: Family Medicine | Admitting: Family Medicine

## 2016-05-21 VITALS — BP 142/88 | HR 112 | Temp 97.6°F

## 2016-05-21 DIAGNOSIS — R109 Unspecified abdominal pain: Secondary | ICD-10-CM

## 2016-05-21 DIAGNOSIS — R112 Nausea with vomiting, unspecified: Secondary | ICD-10-CM

## 2016-05-21 LAB — URINALYSIS, ROUTINE W REFLEX MICROSCOPIC
Bilirubin Urine: NEGATIVE
Glucose, UA: NEGATIVE
Ketones, ur: NEGATIVE
Nitrite: NEGATIVE
Specific Gravity, Urine: 1.02 (ref 1.001–1.035)
pH: 7 (ref 5.0–8.0)

## 2016-05-21 LAB — URINALYSIS, MICROSCOPIC ONLY
Casts: NONE SEEN [LPF]
Crystals: NONE SEEN [HPF]
Yeast: NONE SEEN [HPF]

## 2016-05-21 MED ORDER — KETOROLAC TROMETHAMINE 60 MG/2ML IM SOLN
60.0000 mg | Freq: Once | INTRAMUSCULAR | Status: AC
  Administered 2016-05-21: 60 mg via INTRAMUSCULAR

## 2016-05-21 MED ORDER — TAMSULOSIN HCL 0.4 MG PO CAPS: 0.4000 mg | ORAL_CAPSULE | Freq: Every day | ORAL | 0 refills | Status: DC

## 2016-05-21 MED ORDER — OXYCODONE-ACETAMINOPHEN 10-325 MG PO TABS: 1.0000 | ORAL_TABLET | Freq: Four times a day (QID) | ORAL | 0 refills | Status: DC | PRN

## 2016-05-21 MED ORDER — PROMETHAZINE HCL 25 MG/ML IJ SOLN
25.0000 mg | Freq: Once | INTRAMUSCULAR | Status: AC
  Administered 2016-05-21: 25 mg via INTRAMUSCULAR

## 2016-05-21 NOTE — Addendum Note (Signed)
Addended by: Shary Decamp B on: 05/21/2016 11:59 AM   Modules accepted: Orders

## 2016-05-21 NOTE — Progress Notes (Signed)
Subjective:    Patient ID: Kelli Moss, female    DOB: 1951-05-14, 65 y.o.   MRN: [id number]  HPI Patient awoke this morning around 3 AM with the sudden onset of sharp stabbing left lower back pain/left flank pain that would radiate into lower abd.  she also reports nausea and vomiting. She denies any diarrhea. She denies any blood in her stools. She denies any constipation. She denies any fever. When the pain hits she is unable to get comfortable. Moving around seems to help the pain. Pain seems to get worse if she lies completely still. She is unable to find a comfortable location. Past Medical History:  Diagnosis Date  . Colon polyp   . CTS (carpal tunnel syndrome)   . Depression   . Diabetes mellitus without complication (HCC)    prediabetes  . Dyslipidemia   . Gastritis   . Hypertension   . Neuromuscular disorder ([place])    peripheral neuropathy   No past surgical history on file. Current Outpatient Prescriptions on File Prior to Visit  Medication Sig Dispense Refill  . amitriptyline (ELAVIL) 25 MG tablet TAKE 4 TABLETS BY MOUTH AT BEDTIME 360 tablet 2  . amLODipine (NORVASC) 10 MG tablet Take 1 tablet (10 mg total) by mouth daily. 90 tablet 3  . aspirin 81 MG tablet Take 81 mg by mouth daily.    . benazepril (LOTENSIN) 40 MG tablet TAKE 1 TABLET BY MOUTH EVERY DAY 90 tablet 4  . chlorpheniramine-HYDROcodone (TUSSIONEX PENNKINETIC ER) 10-8 MG/5ML SUER Take 5 mLs by mouth every 12 (twelve) hours as needed for cough. 140 mL 0  . ciclopirox (PENLAC) 8 % solution Apply topically at bedtime. Apply over nail and surrounding skin. Apply daily over previous coat. After seven (7) days, may remove with alcohol and continue cycle. 6.6 mL 5  . CINNAMON PO Take 2,000 mg by mouth daily.    . cyanocobalamin (,VITAMIN B-12,) 1000 MCG/ML injection Inject 1 mL (1,000 mcg total) into the muscle every 30 (thirty) days. 30 mL 5  . diazepam (VALIUM) 10 MG tablet Take 1 tablet (10 mg total) by mouth  every 8 (eight) hours as needed (muscle spasms). 60 tablet 2  . Efinaconazole 10 % SOLN As directed for toenail fungus 8 mL 3  . Flaxseed, Linseed, (FLAX SEEDS PO) Take 1 tablet by mouth daily.    Marland Kitchen LORazepam (ATIVAN) 1 MG tablet Take 1 tablet (1 mg total) by mouth at bedtime as needed. 90 tablet 0  . predniSONE (DELTASONE) 20 MG tablet 3 tabs poqday 1-2, 2 tabs poqday 3-4, 1 tab poqday 5-6 12 tablet 0  . tiZANidine (ZANAFLEX) 4 MG tablet Take 1 tablet (4 mg total) by mouth every 8 (eight) hours as needed for muscle spasms. 30 tablet 1  . UNABLE TO FIND Med Name: TENS UNIT DX: M54.2; M54.9 1 Units 0   No current facility-administered medications on file prior to visit.    Allergies  Allergen Reactions  . Lyrica [Pregabalin]     nightmares  . Neurontin [Gabapentin]     Very whoozy   Social History   Social History  . Marital status: Married    Spouse name: N/A  . Number of children: N/A  . Years of education: N/A   Occupational History  . Not on file.   Social History Main Topics  . Smoking status: Former Smoker    Quit date: 01/23/1988  . Smokeless tobacco: Never Used  . Alcohol use No  .  Drug use: No  . Sexual activity: Not on file   Other Topics Concern  . Not on file   Social History Narrative  . No narrative on file      Review of Systems  All other systems reviewed and are negative.      Objective:   Physical Exam  Cardiovascular: Normal rate, regular rhythm and normal heart sounds.   Pulmonary/Chest: Effort normal and breath sounds normal.  Abdominal: Soft. Bowel sounds are normal. She exhibits no distension. There is no tenderness. There is no rebound and no guarding.  Vitals reviewed.   No CVAT      Assessment & Plan:  Nausea and vomiting, intractability of vomiting not specified, unspecified vomiting type - Plan: promethazine (PHENERGAN) injection 25 mg, Urinalysis, Routine w reflex microscopic (not at [facility])  Left flank pain - Plan: Urinalysis,  Routine w reflex microscopic (not at [facility])  Symptoms and history are consistent with nephrolithiasis. Patient was given 25 mg of Phenergan and 60 mg of Toradol in the office. I will obtain a urinalysis to evaluate for hematuria and also look for signs of urinary tract infection. Will also obtain a KUB. If urinalysis supports kidney stone, I will treat the patient Percocet 10/325 one every 4 hours as needed for pain and Flomax 0.4 mg by mouth daily at bedtime to facilitate and stone passage

## 2016-05-21 NOTE — Addendum Note (Signed)
Addended by: Shary Decamp B on: 05/21/2016 03:31 PM   Modules accepted: Orders

## 2016-05-22 ENCOUNTER — Other Ambulatory Visit: Payer: Self-pay | Admitting: Family Medicine

## 2016-05-22 DIAGNOSIS — N200 Calculus of kidney: Secondary | ICD-10-CM

## 2016-05-24 ENCOUNTER — Ambulatory Visit
Admission: RE | Admit: 2016-05-24 | Discharge: 2016-05-24 | Disposition: A | Discharge status: Home / Self Care | Payer: Medicare Other | Source: Ambulatory Visit | Attending: Family Medicine | Admitting: Family Medicine

## 2016-05-24 DIAGNOSIS — N132 Hydronephrosis with renal and ureteral calculous obstruction: Secondary | ICD-10-CM | POA: Diagnosis not present

## 2016-05-24 DIAGNOSIS — N200 Calculus of kidney: Secondary | ICD-10-CM

## 2016-05-25 ENCOUNTER — Other Ambulatory Visit: Payer: BLUE CROSS/BLUE SHIELD

## 2016-05-30 ENCOUNTER — Telehealth: Payer: Self-pay | Admitting: Physician Assistant

## 2016-05-30 DIAGNOSIS — R109 Unspecified abdominal pain: Secondary | ICD-10-CM

## 2016-05-30 DIAGNOSIS — R112 Nausea with vomiting, unspecified: Secondary | ICD-10-CM

## 2016-05-30 MED ORDER — OXYCODONE-ACETAMINOPHEN 10-325 MG PO TABS: 1.0000 | ORAL_TABLET | Freq: Four times a day (QID) | ORAL | 0 refills | Status: DC | PRN

## 2016-05-30 NOTE — Telephone Encounter (Signed)
Patient would like to speak you regarding her recent diagnosis of kidney stones  Please call her at [phone]

## 2016-05-30 NOTE — Telephone Encounter (Signed)
Pt called and states that she is going out of town at 11:30 and needs her pain med refilled - it's 10:43 at this moment and I explained to her that was impossible and our procedure for getting pain meds. She wanted to know if we could write it and her sister will pick up tomorrow and over night it to her along with copies of her KUB and CT. Ok to refill her pain medication??? She also wanted to know how long it usually takes to pass a stone and is there anything else she needs to be doing?

## 2016-05-31 NOTE — Telephone Encounter (Signed)
RX and copies of tests left up front for pt's sister to pick up.

## 2016-05-31 NOTE — Telephone Encounter (Signed)
She needs to see urology if not getting better.  Pain meds on my desk.

## 2016-07-11 ENCOUNTER — Telehealth: Payer: Self-pay | Admitting: Physician Assistant

## 2016-07-11 DIAGNOSIS — Z9989 Dependence on other enabling machines and devices: Principal | ICD-10-CM

## 2016-07-11 DIAGNOSIS — G4733 Obstructive sleep apnea (adult) (pediatric): Secondary | ICD-10-CM

## 2016-07-11 NOTE — Telephone Encounter (Signed)
Would like a referral for her sleep apnea to [facility]  [address]  Fax [phone]  Please call her with any questions  [phone] she has a lot of detail that goes into the referral

## 2016-07-18 NOTE — Telephone Encounter (Signed)
There is no documentation in recent office notes about apnea.  Pt must be seen and evaluated here first.  Will need to send noted to sleep lab before they will schedule.  LMTCB

## 2016-07-19 NOTE — Telephone Encounter (Signed)
Pt say she has been on a CPAP for OSA for 15 years.  Was seen in [place] for some sort of oral issue and they are recommending another study to go with the oral orthotics she is having made.  To have [place] provider to send me those notes to forward to sleep lab

## 2016-07-20 NOTE — Telephone Encounter (Signed)
Pt came to office.  Discussed notes from [facility].  He wants her to see sleep specialist not just have another study done.  Will do referral to [facility] and pt agrees.  No need to make appt here.  PCP will only do referral which can be initiated now with notes received.

## 2016-07-23 NOTE — Telephone Encounter (Signed)
[place] with referral to sleep specialist.  Please order.

## 2016-07-26 ENCOUNTER — Ambulatory Visit (INDEPENDENT_AMBULATORY_CARE_PROVIDER_SITE_OTHER): Payer: Medicare Other | Admitting: Neurology

## 2016-07-26 ENCOUNTER — Encounter: Payer: Self-pay | Admitting: Neurology

## 2016-07-26 VITALS — BP 137/70 | HR 70 | Resp 20 | Ht 67.0 in | Wt 258.0 lb

## 2016-07-26 DIAGNOSIS — M26621 Arthralgia of right temporomandibular joint: Secondary | ICD-10-CM

## 2016-07-26 DIAGNOSIS — E669 Obesity, unspecified: Secondary | ICD-10-CM | POA: Diagnosis not present

## 2016-07-26 DIAGNOSIS — M7989 Other specified soft tissue disorders: Secondary | ICD-10-CM

## 2016-07-26 DIAGNOSIS — R51 Headache: Secondary | ICD-10-CM

## 2016-07-26 DIAGNOSIS — R519 Headache, unspecified: Secondary | ICD-10-CM

## 2016-07-26 DIAGNOSIS — G4733 Obstructive sleep apnea (adult) (pediatric): Secondary | ICD-10-CM

## 2016-07-26 NOTE — Progress Notes (Signed)
Subjective:    Patient ID: Kelli Moss is a 65 y.o. female.  HPI     Kelli Age, MD, PhD [facility] [address], [address] P.O. [place], [place] [address]  Dear Kelli Moss,   I saw your patient, Kelli Moss, upon your kind request in my neurologic clinic today for initial consultation of her sleep disorder, in particular evaluation of her prior diagnosis of obstructive sleep apnea. The patient is unaccompanied today. As you know, Kelli Moss is a 65 year old right-handed woman with an underlying medical history of type 2 diabetes, hyperlipidemia, hypertension, carpal tunnel syndrome, neck pain, peripheral neuropathy, and morbid obesity, was previously diagnosed with obstructive sleep apnea. She had a sleep study some 15 years ago placed on CPAP therapy. Prior sleep study results were reviewed today. She had a baseline sleep study on 10/14/2000. Her total AHI was 13.9 per hour. Average oxygen saturation was 92%, nadir was 71% during REM sleep. Her PLM index was 56.3 per hour, with very little arousals. She then return for a full night CPAP titration study as I understand. Those results are not available for my review. She has been using CPAP. CPAP compliance data is not available for my review.  She has had 2 CPAP machines. Her last machine broke. She has not used the CPAP in 1 month.Her mother passed away recently. She had obstructive sleep apnea. Her brother also has obstructive sleep apnea and uses a CPAP machine. Patient's Epworth sleepiness score is 9 out of 24 today, her fatigue score is 52/63. Bedtime and waketime varies: 9 PM, up by 3-4 AM.  Nocturia is about 1 time per night.  Of note, she has seen a TMJ specialist in [place], [place]. He recommended a repeat sleep study. She is in the process of getting a TMJ appliance from what I understand.   Her Past Medical History Is Significant For: Past Medical History:  Diagnosis Date  . Colon polyp   . CTS (carpal  tunnel syndrome)   . Depression   . Diabetes mellitus without complication (HCC)    prediabetes  . Dyslipidemia   . Gastritis   . Hypertension   . Neuromuscular disorder ([place])    peripheral neuropathy    Her Past Surgical History Is Significant For: No past surgical history on file.  Her Family History Is Significant For: No family history on file.  Her Social History Is Significant For: Social History   Social History  . Marital status: Married    Spouse name: N/A  . Number of children: N/A  . Years of education: N/A   Social History Main Topics  . Smoking status: Former Smoker    Quit date: 01/23/1988  . Smokeless tobacco: Never Used  . Alcohol use No  . Drug use: No  . Sexual activity: Not Asked   Other Topics Concern  . None   Social History Narrative  . None    Her Allergies Are:  Allergies  Allergen Reactions  . Lyrica [Pregabalin]     nightmares  . Neurontin [Gabapentin]     Very whoozy  :   Her Current Medications Are:  Outpatient Encounter Prescriptions as of 07/26/2016  Medication Sig  . amLODipine (NORVASC) 10 MG tablet Take 1 tablet (10 mg total) by mouth daily.  Marland Kitchen aspirin 81 MG tablet Take 81 mg by mouth daily.  . benazepril (LOTENSIN) 40 MG tablet TAKE 1 TABLET BY MOUTH EVERY DAY  . chlorpheniramine-HYDROcodone (TUSSIONEX PENNKINETIC ER) 10-8 MG/5ML  SUER Take 5 mLs by mouth every 12 (twelve) hours as needed for cough.  . ciclopirox (PENLAC) 8 % solution Apply topically at bedtime. Apply over nail and surrounding skin. Apply daily over previous coat. After seven (7) days, may remove with alcohol and continue cycle.  Marland Kitchen CINNAMON PO Take 2,000 mg by mouth daily.  . cyanocobalamin (,VITAMIN B-12,) 1000 MCG/ML injection Inject 1 mL (1,000 mcg total) into the muscle every 30 (thirty) days.  . diazepam (VALIUM) 10 MG tablet Take 1 tablet (10 mg total) by mouth every 8 (eight) hours as needed (muscle spasms).  . Efinaconazole 10 % SOLN As directed for  toenail fungus  . Flaxseed, Linseed, (FLAX SEEDS PO) Take 1 tablet by mouth daily.  . Grape Seed 100 MG CAPS Take by mouth.  Marland Kitchen LORazepam (ATIVAN) 1 MG tablet Take 1 tablet (1 mg total) by mouth at bedtime as needed.  . Magnesium 500 MG TABS Take 500 mg by mouth.  . Misc Natural Products (BLACK CHERRY CONCENTRATE PO) Take by mouth.  . oxyCODONE-acetaminophen (PERCOCET) 10-325 MG tablet Take 1 tablet by mouth every 6 (six) hours as needed for pain.  Vladimir Faster Glycol-Propyl Glycol (SYSTANE OP) Apply to eye.  . predniSONE (DELTASONE) 20 MG tablet 3 tabs poqday 1-2, 2 tabs poqday 3-4, 1 tab poqday 5-6  . tamsulosin (FLOMAX) 0.4 MG CAPS capsule Take 1 capsule (0.4 mg total) by mouth daily.  Marland Kitchen tiZANidine (ZANAFLEX) 4 MG tablet Take 1 tablet (4 mg total) by mouth every 8 (eight) hours as needed for muscle spasms.  . TURMERIC PO Take 1,000 mg by mouth.  Marland Kitchen UNABLE TO FIND Med Name: TENS UNIT DX: M54.2; M54.9  . [DISCONTINUED] amitriptyline (ELAVIL) 25 MG tablet TAKE 4 TABLETS BY MOUTH AT BEDTIME   No facility-administered encounter medications on file as of 07/26/2016.   :  Review of Systems:  Out of a complete 14 point review of systems, all are reviewed and negative with the exception of these symptoms as listed below: Review of Systems  Neurological: Positive for weakness, numbness and headaches.       Pt presents today to discuss the possibility of getting a sleep study. Pt says that her TMJ doctor needs a new sleep study so he can make an orthotic. Pt has been using CPAP since 2002 but her machine broke.  Epworth Sleepiness Scale 0= would never doze 1= slight chance of dozing 2= moderate chance of dozing 3= high chance of dozing  Sitting and reading: 1 Watching TV: 1 Sitting inactive in a public place (ex. Theater or meeting): 0 As a passenger in a car for an hour without a break: 3 Lying down to rest in the afternoon: 3 Sitting and talking to someone: 0 Sitting quietly after lunch (no  alcohol): 1 In a car, while stopped in traffic: 0 Total: 9  Psychiatric/Behavioral: Positive for sleep disturbance.    Objective:  Neurologic Exam  Physical Exam Physical Examination:   Vitals:   07/26/16 1126  BP: 137/70  Pulse: 70  Resp: 20   General Examination: The patient is a very pleasant 65 y.o. female in no acute distress. She appears well-developed and well-nourished and well groomed.   HEENT: Normocephalic, atraumatic, pupils are equal, round and reactive to light and accommodation. Funduscopic exam is normal with sharp disc margins noted. Extraocular tracking is good without limitation to gaze excursion or nystagmus noted. Normal smooth pursuit is noted. Hearing is grossly intact. Face is symmetric with normal facial  animation and normal facial sensation. Speech is clear with no dysarthria noted. There is no hypophonia. There is no lip, neck/head, jaw or voice tremor. Neck she has decreased active range of motion. She reports pain in her right jaw and right TMJ area. No carotid bruits on auscultation. Oropharynx exam reveals: moderate mouth dryness, adequate dental hygiene and moderate airway crowding. Mallampati is class III. Tongue protrudes centrally and palate elevates symmetrically. Neck size is 20.5 inches. She has a Mild overbite.   Chest: Clear to auscultation without wheezing, rhonchi or crackles noted.  Heart: S1+S2+0, regular and normal without murmurs, rubs or gallops noted.   Abdomen: Soft, non-tender and non-distended with normal bowel sounds appreciated on auscultation.  Extremities: There is 1+ edema in the distal lower extremities bilaterally.  Skin: Warm and dry without trophic changes noted.  Musculoskeletal: exam reveals no obvious joint deformities, tenderness or joint swelling or erythema.   Neurologically:  Mental status: The patient is awake, alert and oriented in all 4 spheres. Her immediate and remote memory, attention, language skills and fund  of knowledge are appropriate. There is no evidence of aphasia, agnosia, apraxia or anomia. Speech is clear with normal prosody and enunciation. Thought process is linear. Mood is normal and affect is normal.  Cranial nerves II - XII are as described above under HEENT exam. In addition: shoulder shrug is normal with equal shoulder height noted. Motor exam: Normal bulk, strength and tone is noted. There is no drift, tremor or rebound. Romberg is negative. Reflexes are 1+ in the UEs hroughout. Fine motor skills and coordination: intact with normal finger taps, normal hand movements, normal rapid alternating patting, normal foot taps and normal foot agility.  Cerebellar testing: No dysmetria or intention tremor on finger to nose testing. Heel to shin is unremarkable bilaterally. There is no truncal or gait ataxia.  Sensory exam: decrease to PP in the distal LEs.   Gait, station and balance: She stands with difficulty. No veering to one side is noted. No leaning to one side is noted. Posture is Moss-appropriate and stance is narrow based. Gait shows slow and cautious gait.   Assessment and Plan:  In summary, IBUKUNOLUWA LEVENTIS is a very pleasant 65 y.o.-year old female with an underlying medical history of type 2 diabetes, hyperlipidemia, hypertension, carpal tunnel syndrome, neck pain, peripheral neuropathy, and morbid obesity, was previously diagnosed with obstructive sleep apnea. She needs reevaluation and also a new CPAP machine.  I had a long chat with the patient and her husband about my findings and the diagnosis of OSA, its prognosis and treatment options. We talked about medical treatments, surgical interventions and non-pharmacological approaches. I explained in particular the risks and ramifications of untreated moderate to severe OSA, especially with respect to developing cardiovascular disease down the Road, including congestive heart failure, difficult to treat hypertension, cardiac arrhythmias, or  stroke. Even type 2 diabetes has, in part, been linked to untreated OSA. Symptoms of untreated OSA include daytime sleepiness, memory problems, mood irritability and mood disorder such as depression and anxiety, lack of energy, as well as recurrent headaches, especially morning headaches. We talked about trying to maintain a healthy lifestyle in general, as well as the importance of weight control. I encouraged the patient to eat healthy, exercise daily and keep well hydrated, to keep a scheduled bedtime and wake time routine, to not skip any meals and eat healthy snacks in between meals. I advised the patient not to drive when feeling sleepy. I recommended  the following at this time: sleep study with potential positive airway pressure titration. (We will score hypopneas at 4% and split the sleep study into diagnostic and treatment portion, if the estimated. 2 hour AHI is >15/h).   I explained the sleep test procedure to the patient and also outlined possible surgical and non-surgical treatment options of OSA, including the use of a custom-made dental device (which would require a referral to a specialist dentist or oral surgeon), upper airway surgical options, such as pillar implants, radiofrequency surgery, tongue base surgery, and UPPP (which would involve a referral to an ENT surgeon). Rarely, jaw surgery such as mandibular advancement may be considered.  I also explained the CPAP treatment option to the patient, who indicated that she would be willing to use CPAP if the need arises. I explained the importance of being compliant with PAP treatment, not only for insurance purposes but primarily to improve Her symptoms, and for the patient's long term health benefit, including to reduce Her cardiovascular risks. I answered all their questions today and the patient and her husband were in agreement. I would like to see her back after the sleep study is completed and encouraged her to call with any interim  questions, concerns, problems or updates.   Thank you very much for allowing me to participate in the care of this nice patient. If I can be of any further assistance to you please do not hesitate to call me at [phone].  Sincerely,   Kelli Age, MD, PhD

## 2016-07-26 NOTE — Patient Instructions (Signed)
Based on your symptoms and your exam I believe you still are at risk for obstructive sleep apnea or OSA, and I think we should proceed with a sleep study to determine whether you do or do not have OSA and how severe it is. If you have more than mild OSA, I want you to consider treatment with CPAP. Please remember, the risks and ramifications of moderate to severe obstructive sleep apnea or OSA are: Cardiovascular disease, including congestive heart failure, stroke, difficult to control hypertension, arrhythmias, and even type 2 diabetes has been linked to untreated OSA. Sleep apnea causes disruption of sleep and sleep deprivation in most cases, which, in turn, can cause recurrent headaches, problems with memory, mood, concentration, focus, and vigilance. Most people with untreated sleep apnea report excessive daytime sleepiness, which can affect their ability to drive. Please do not drive if you feel sleepy.   I will likely see you back after your sleep study to go over the test results and where to go from there. We will call you after your sleep study to advise about the results (most likely, you will hear from Beverlee Nims, my nurse) and to set up an appointment at the time, as necessary.    Our sleep lab administrative assistant, Arrie Aran will meet with you or call you to schedule your sleep study. If you don't hear back from her by next week please feel free to call her at [phone]. This is her direct line and please leave a message with your phone number to call back if you get the voicemail box. She will call back as soon as possible.

## 2016-08-02 ENCOUNTER — Telehealth: Payer: Self-pay | Admitting: Neurology

## 2016-08-02 NOTE — Telephone Encounter (Signed)
Patient is having a sleep study tomorrow and would like to pick up the results next Monday 08-06-16 so she can take to her dentist Dr. Lottie Dawson a TMJ specialist on 08-07-16. Please call and discuss.

## 2016-08-02 NOTE — Telephone Encounter (Signed)
I called pt. She says that she has her sleep study scheduled for Friday night 08/03/16 but needs the sleep study results by Monday afternoon because she has an appt with her dentist, Dr. Suella Grove, a TMJ specialist on Tuesday.  I explained that sleep study results are not processed on the weekends, and can take 7-10 days to get the results. Pt asked if she asked Dr. Suella Grove to call Dr. Rexene Alberts to get the sleep study processed ASAP, would the results be ready on Tuesday? I advised her that this is not necessary, that I would inform Dr. Rexene Alberts of her situation.   I explained several times that pt may not have her sleep study results by Tuesday but that I would speak with Dr. Rexene Alberts regarding her situation, and I would call her with results as soon as they become available. Pt verbalized understanding.

## 2016-08-03 ENCOUNTER — Ambulatory Visit (INDEPENDENT_AMBULATORY_CARE_PROVIDER_SITE_OTHER): Payer: Medicare Other | Admitting: Neurology

## 2016-08-03 DIAGNOSIS — G4733 Obstructive sleep apnea (adult) (pediatric): Secondary | ICD-10-CM | POA: Diagnosis not present

## 2016-08-03 DIAGNOSIS — G4720 Circadian rhythm sleep disorder, unspecified type: Secondary | ICD-10-CM

## 2016-08-03 DIAGNOSIS — G4761 Periodic limb movement disorder: Secondary | ICD-10-CM

## 2016-08-03 DIAGNOSIS — E119 Type 2 diabetes mellitus without complications: Secondary | ICD-10-CM

## 2016-08-06 ENCOUNTER — Telehealth: Payer: Self-pay | Admitting: *Deleted

## 2016-08-06 ENCOUNTER — Telehealth: Payer: Self-pay

## 2016-08-06 NOTE — Progress Notes (Addendum)
Patient referred by Dena Billet, seen by me on 07/26/16, diagnostic PSG on 08/03/16.    Please call and notify the patient that the recent sleep study did confirm the diagnosis of moderate to severe obstructive sleep apnea and that I recommend treatment for this in the form of CPAP. This will require a repeat sleep study for proper titration and mask fitting. Please explain to patient and arrange for a CPAP titration study. I have placed an order in the chart. Thanks, and please route to [facility] for scheduling next sleep study.  Star Age, MD, PhD [facility]  09/17/2016: Wt was 258 lb, BMI 40.4, neck size 20.5 in, based on my exam from 07/26/16. Hx of pre-diabetes, not diabetes, per patient, chart review indicates, DM with comment of pre-diabetes.

## 2016-08-06 NOTE — Addendum Note (Signed)
Addended by: Star Age on: 08/06/2016 09:08 AM   Modules accepted: Orders

## 2016-08-06 NOTE — Telephone Encounter (Signed)
-----   Message from Star Age, MD sent at 08/06/2016  9:08 AM EST ----- Patient referred by Dena Billet, seen by me on 07/26/16, diagnostic PSG on 08/03/16.    Please call and notify the patient that the recent sleep study did confirm the diagnosis of moderate to severe obstructive sleep apnea and that I recommend treatment for this in the form of CPAP. This will require a repeat sleep study for proper titration and mask fitting. Please explain to patient and arrange for a CPAP titration study. I have placed an order in the chart. Thanks, and please route to [facility] for scheduling next sleep study.  Star Age, MD, PhD [facility])

## 2016-08-06 NOTE — Procedures (Signed)
PATIENT'S NAME:  Kelli, Moss DOB:      September 03, 1950      MR#:    [id number]     DATE OF RECORDING: 08/03/2016 REFERRING M.D.:  Dena Billet PA-C Study Performed:   Baseline Polysomnogram HISTORY:  65 year old woman with a history of type 2 diabetes, hyperlipidemia, hypertension, carpal tunnel syndrome, neck pain, peripheral neuropathy, and morbid obesity, who was previously diagnosed with obstructive sleep apnea. She had a sleep study some 15 years ago and was placed on CPAP therapy. The patient endorsed the Epworth Sleepiness Scale at 9 points. The patient's weight 284 pounds with a height of 67 (inches), resulting in a BMI of 44.6 kg/m2. The patient's neck circumference measured 17.5 inches.  CURRENT MEDICATIONS: Norvasc, Aspirin, Lotensin, Hydrocodone, Penlac, Valium, Ativan, Percocet, Deltasone, Flomax, Zanaflex   PROCEDURE:  This is a multichannel digital polysomnogram utilizing the Somnostar 11.2 system.  Electrodes and sensors were applied and monitored per AASM Specifications.   EEG, EOG, Chin and Limb EMG, were sampled at 200 Hz.  ECG, Snore and Nasal Pressure, Thermal Airflow, Respiratory Effort, CPAP Flow and Pressure, Oximetry was sampled at 50 Hz. Digital video and audio were recorded.      BASELINE STUDY Lights Out was at 21:14 and Lights On at 05:03.  Total recording time (TRT) was 469.5 minutes, with a total sleep time (TST) of  357.5 minutes.   The patient's sleep latency was 17 minutes.  REM latency was 325.5 minutes, which is markedly prolonged.  The sleep efficiency was 76.1 %.     SLEEP ARCHITECTURE: WASO ([place] after sleep onset) was 95 minutes, with three longer periods of wakefulness.  There were 24 minutes in Stage N1, 276.5 minutes Stage N2, 52.5 minutes Stage N3 and 4.5 minutes in Stage REM.  The percentage of Stage N1 was 6.7%, Stage N2 was 77.3%, which is increased, Stage N3 was 14.7%, which is normal and Stage R (REM sleep) was 1.3%, which is markedly reduced.   The  arousals were noted as: 9 were spontaneous, 3 were associated with PLMs, 17 were associated with respiratory events. Audio and video analysis did not show any abnormal or unusual movements, behaviors, phonations or vocalizations. Patient slept in the supine position only.  The patient took 3 bathroom breaks. Mild to moderate snoring was noted. The EKG was in keeping with normal sinus rhythm (NSR).  RESPIRATORY ANALYSIS:  There were a total of 101 respiratory events:  8 obstructive apneas, 0 central apneas and 0 mixed apneas with a total of 8 apneas and an apnea index (AI) of 1.3 /hour. There were 93 hypopneas with a hypopnea index of 15.6 /hour. The patient also had 8 respiratory event related arousals (RERAs).      The total APNEA/HYPOPNEA INDEX (AHI) was 17./hour and the total RESPIRATORY DISTURBANCE INDEX was 18.3 /hour.  3 events occurred in REM sleep and 180 events in NREM. The REM AHI was 40 /hour, versus a non-REM AHI of 16.7. The patient spent 357.5 minutes of total sleep time in the supine position and 0 minutes in non-supine.. The supine AHI was 16.9 versus a non-supine AHI of 0.0.  OXYGEN SATURATION & C02:  The [place] baseline 02 saturation was 96%, with the lowest being 82%. Time spent below 89% saturation equaled 166 minutes.  PERIODIC LIMB MOVEMENTS:   The patient had a total of 142 Periodic Limb Movements.  The Periodic Limb Movement (PLM) index was 23.8 and the PLM Arousal index was .5/hour.  IMPRESSION: 1. Obstructive  Sleep Apnea (OSA) 2. Periodic Limb Movement Disorder (PLMD) 3. Dysfunctions associated with sleep stages or arousal from sleep  RECOMMENDATIONS: 1. This overnight polysomnogram demonstrates moderate to severe obstructive sleep apnea. Please note, the near-absence of REM sleep during this study may underestimate her AHI and O2 nadir.  2. Treatment options may include: Positive airway pressure (in the form of CPAP or autoPAP) and ideally is achieved during a full night  CPAP titration study for proper treatment settings and mask fitting, avoidance of the supine sleep position, weight loss, an oral appliance (aka dental device, custom made by a specialized dentist usually), or upper airway or jaw surgery (not usually first line treatments). Given her medical history and moderate to severe nature of her OSA, CPAP therapy is recommended and the patient will be advised to return for a full night titration study.  3. The patient should be cautioned not to drive, work at heights, or operate dangerous or heavy equipment when tired or sleepy. Review and reiteration of good sleep hygiene measures should be pursued with any patient. 4. Please note that untreated obstructive sleep apnea carries additional perioperative morbidity. Patients with significant obstructive sleep apnea should receive perioperative PAP therapy and the surgeons and particularly the anesthesiologist should be informed of the diagnosis and the severity of the sleep disordered breathing. 5. Mild PLMs (periodic limb movements of sleep) were noted during the study without significant arousals; clinical correlation is recommended.  6. This study shows sleep fragmentation and abnormal sleep stage percentages; these are nonspecific findings and per se do not signify an intrinsic sleep disorder or a cause for the patient's sleep-related symptoms. Causes include (but are not limited to) the first night effect of the sleep study, circadian rhythm disturbances, medication effect or an underlying mood disorder or medical problem.  7. The patient will be seen in follow-up by Dr. Rexene Alberts at [facility] for discussion of the test results and further management strategies. The referring provider will be notified of the test results.  I certify that I have reviewed the entire raw data recording prior to the issuance of this report in accordance with the Standards of Accreditation of the American Academy of Sleep Medicine  (AASM)    Star Age, MD, PhD Diplomat, American Board of Psychiatry and Neurology (Neurology and Sleep Medicine)

## 2016-08-06 NOTE — Telephone Encounter (Signed)
Pt medical records ,bills mailed on 08/06/16.

## 2016-08-06 NOTE — Telephone Encounter (Signed)
I spoke to pt. I advised her that Dr. Rexene Alberts reviewed her sleep study results and wants to her to know that the diagnosis of moderate to severe sleep apnea has been confirmed and Dr. Rexene Alberts does recommend treatment in the form of cpap. Pt is agreeable to the cpap titration study, and wants to know when it can be scheduled. I advised her that [place], our sleep lab scheduler will call her to discuss. Pt asked that a copy of her sleep study results be faxed to Dr. Salvadore Dom at [phone], ATTN: Maudie Mercury, because she has an appt tomorrow with Dr. Salvadore Dom.  Pt is also asking if the office visit notes from Dr. Rexene Alberts and the billing records be mailed to her home address. I will send this request to MR.  Pt verbalized understanding of results. Pt had no questions at this time but was encouraged to call back if questions arise.

## 2016-08-08 ENCOUNTER — Institutional Professional Consult (permissible substitution): Payer: Medicare Other | Admitting: Neurology

## 2016-08-15 ENCOUNTER — Ambulatory Visit (INDEPENDENT_AMBULATORY_CARE_PROVIDER_SITE_OTHER): Payer: Medicare Other | Admitting: Neurology

## 2016-08-15 DIAGNOSIS — G4733 Obstructive sleep apnea (adult) (pediatric): Secondary | ICD-10-CM | POA: Diagnosis not present

## 2016-08-16 ENCOUNTER — Institutional Professional Consult (permissible substitution): Payer: Medicare Other | Admitting: Neurology

## 2016-08-22 NOTE — Addendum Note (Signed)
Addended by: Star Age on: 08/22/2016 09:07 AM   Modules accepted: Orders

## 2016-08-22 NOTE — Procedures (Signed)
PATIENT'S NAME:  Kelli Moss, Kelli Moss DOB:      [birth year]      MR#:    [id number]     DATE OF RECORDING: 08/15/2016 REFERRING M.D.:  Dena Billet PA-C Study Performed:   CPAP  Titration HISTORY: 66 year old woman with a history of type 2 diabetes, hyperlipidemia, hypertension, carpal tunnel syndrome, neck pain, peripheral neuropathy, and morbid obesity, who presents for a full night CPAP titration. She had a baseline sleep study on 08/03/16 with an AHI of 17 per hour and a lowest desaturation to 82%. The patient endorsed the Epworth Sleepiness Scale at 9 points. The patient's weight 258 pounds with a height of 67 (inches), resulting in a BMI of 40.5 kg/m2. The patient's neck circumference measured 20.5 inches.  CURRENT MEDICATIONS: Norvasc, Aspirin, Lotensin, Hydrocodone, Penlac, Valium, Ativan, Percocet, Deltasone, Flomax, Zanaflex    PROCEDURE:  This is a multichannel digital polysomnogram utilizing the SomnoStar 11.2 system.  Electrodes and sensors were applied and monitored per AASM Specifications.   EEG, EOG, Chin and Limb EMG, were sampled at 200 Hz.  ECG, Snore and Nasal Pressure, Thermal Airflow, Respiratory Effort, CPAP Flow and Pressure, Oximetry was sampled at 50 Hz. Digital video and audio were recorded.       Patient was fitted with a Rachel, size large. CPAP was initiated at 7 cmH20 with heated humidity per AASM split night standards and pressure was advanced to 15 cmH20 because of hypopneas, apneas and desaturations. At a PAP pressure of 15 cmH20, there was a reduction of the AHI to 0 with supine REM sleep achieved.    Lights Out was at 22:45 and Lights On at 05:43. Total recording time (TRT) was 418 minutes, with a total sleep time (TST) of 353 minutes. The patient's sleep latency was 4 minutes with 4.5 minutes of wake time after sleep onset. REM latency was 312 minutes.  The sleep efficiency was 84.4 %.    SLEEP ARCHITECTURE: WASO (Wake after sleep onset)  was 56.5 minutes  with 2 longer periods of wakefulness, but otherwise no significant sleep fragmentation.  There were 3.5 minutes in Stage N1, 229.5 minutes Stage N2, 84 minutes Stage N3 and 36 minutes in Stage REM.  The percentage of Stage N1 was 1.%, Stage N2 was 65.%, which is mildly increased, Stage N3 was 23.8%, which is increased, and Stage R (REM sleep) was 10.2%, which is reduced.   The arousals were noted as: 12 were spontaneous, 0 were associated with PLMs, 1 were associated with respiratory events.  Audio and video analysis did not show any abnormal or unusual movements, behaviors, phonations or vocalizations.  The patient took 2 bathroom breaks.  Snoring was noted, and was at times loud. The EKG was in keeping with normal sinus rhythm (NSR).   RESPIRATORY ANALYSIS:  There was a total of 1 respiratory events: 0 obstructive apneas, 0 central apneas and 0 mixed apneas with a total of 0 apneas and an apnea index (AI) of 0 /hour. There were 1 hypopneas with a hypopnea index of .2/hour. The patient also had 0 respiratory event related arousals (RERAs).      The total APNEA/HYPOPNEA INDEX  (AHI) was .2 /hour and the total RESPIRATORY DISTURBANCE INDEX was .2 .hour  0 events occurred in REM sleep and 1 events in NREM. The REM AHI was 0 /hour versus a non-REM AHI of .2 /hour.  The patient spent 157.5 minutes of total sleep time in the supine position and 196 minutes  in non-supine. The supine AHI was 0.0, versus a non-supine AHI of 0.3.  OXYGEN SATURATION & C02:  The baseline 02 saturation was 95%, with the lowest being 84%. Time spent below 89% saturation equaled 6 minutes.  PERIODIC LIMB MOVEMENTS:    The patient had a total of 0 Periodic Limb Movements. The Periodic Limb Movement (PLM) index was 0 and the PLM Arousal index was 0 /hour.  DIAGNOSIS  1.  Obstructive Sleep Apnea   PLANS/RECOMMENDATIONS:  1. This study demonstrates resolution of the patient's obstructive sleep apnea with CPAP therapy. I will,  therefore, start the patient on home CPAP treatment at a pressure of 15 cm via large FFM with heated humidity. The patient should be reminded to be fully compliant with PAP therapy to improve sleep related symptoms and decrease long term cardiovascular risks. The patient should be reminded, that it may take up to 3 months to get fully used to using PAP with all planned sleep. The earlier full compliance is achieved, the better long term compliance tends to be. Please note that untreated obstructive sleep apnea carries additional perioperative morbidity. Patients with significant obstructive sleep apnea should receive perioperative PAP therapy and the surgeons and particularly the anesthesiologist should be informed of the diagnosis and the severity of the sleep disordered breathing. 2. The patient should be cautioned not to drive, work at heights, or operate dangerous or heavy equipment when tired or sleepy. Review and reiteration of good sleep hygiene measures should be pursued with any patient. 3. The patient will be seen in follow-up by Dr. Rexene Alberts at [facility] for discussion of the test results and further management strategies. The referring provider will be notified of the test results.  I certify that I have reviewed the entire raw data recording prior to the issuance of this report in accordance with the Standards of Accreditation of the American Academy of Sleep Medicine (AASM)     Star Age, MD, PhD Diplomat, American Board of Psychiatry and Neurology (Neurology and Sleep Medicine)

## 2016-08-22 NOTE — Progress Notes (Signed)
Patient referred by Dena Billet, seen by me on 07/26/16, diagnostic PSG on 08/03/16, CPAP study on 08/15/16.  Please call and inform patient that I have entered an order for treatment with positive airway pressure (PAP) treatment of obstructive sleep apnea (OSA). She did well during the latest sleep study with CPAP. We will, therefore, arrange for a machine for home use through a DME (durable medical equipment) company of Her choice; she may have existing DME.  I will see the patient back in follow-up in about 8-10 weeks. Please also explain to the patient that I will be looking out for compliance data, which can be downloaded from the machine (stored on an SD card, that is inserted in the machine) or via remote access through a modem, that is built into the machine. At the time of the followup appointment we will discuss sleep study results and how it is going with PAP treatment at home. Please advise patient to bring Her machine at the time of the first FU visit, even though this is cumbersome. Bringing the machine for every visit after that will likely not be needed, but often helps for the first visit to troubleshoot if needed. Please re-enforce the importance of compliance with treatment and the need for [place] to monitor compliance data - often an insurance requirement and actually good feedback for the patient as far as how they are doing.  Also remind patient, that any interim PAP machine or mask issues should be first addressed with the DME company, as they can often help better with technical and mask fit issues. Please ask if patient has a preference regarding DME company.  Please also make sure, the patient has a follow-up appointment with me in about 8-10 weeks from the setup date, thanks.  Once you have spoken to the patient - and faxed/routed report to PCP and referring MD (if other than PCP), you can close this encounter, thanks,   Star Age, MD, PhD [facility] ([place])

## 2016-08-23 ENCOUNTER — Telehealth: Payer: Self-pay

## 2016-08-23 NOTE — Telephone Encounter (Signed)
I spoke to patient and she is willing to start CPAP again, she asked that I send orders to [place] Patient. Patient requested that a copy of report to be mailed to her and her TMJ specialist. I will also send to PCP. Patient will get a letter reminding her to make f/u appt and stress the importance of compliance.

## 2016-08-23 NOTE — Telephone Encounter (Signed)
-----   Message from Star Age, MD sent at 08/22/2016  9:07 AM EST ----- Patient referred by Dena Billet, seen by me on 07/26/16, diagnostic PSG on 08/03/16, CPAP study on 08/15/16.  Please call and inform patient that I have entered an order for treatment with positive airway pressure (PAP) treatment of obstructive sleep apnea (OSA). She did well during the latest sleep study with CPAP. We will, therefore, arrange for a machine for home use through a DME (durable medical equipment) company of Her choice; she may have existing DME.  I will see the patient back in follow-up in about 8-10 weeks. Please also explain to the patient that I will be looking out for compliance data, which can be downloaded from the machine (stored on an SD card, that is inserted in the machine) or via remote access through a modem, that is built into the machine. At the time of the followup appointment we will discuss sleep study results and how it is going with PAP treatment at home. Please advise patient to bring Her machine at the time of the first FU visit, even though this is cumbersome. Bringing the machine for every visit after that will likely not be needed, but often helps for the first visit to troubleshoot if needed. Please re-enforce the importance of compliance with treatment and the need for [place] to monitor compliance data - often an insurance requirement and actually good feedback for the patient as far as how they are doing.  Also remind patient, that any interim PAP machine or mask issues should be first addressed with the DME company, as they can often help better with technical and mask fit issues. Please ask if patient has a preference regarding DME company.  Please also make sure, the patient has a follow-up appointment with me in about 8-10 weeks from the setup date, thanks.  Once you have spoken to the patient - and faxed/routed report to PCP and referring MD (if other than PCP), you can close this encounter, thanks,    Star Age, MD, PhD [facility] ([place])

## 2016-08-31 ENCOUNTER — Other Ambulatory Visit: Payer: Self-pay | Admitting: Family Medicine

## 2016-09-17 ENCOUNTER — Telehealth: Payer: Self-pay | Admitting: Neurology

## 2016-09-17 DIAGNOSIS — E119 Type 2 diabetes mellitus without complications: Secondary | ICD-10-CM | POA: Insufficient documentation

## 2016-09-17 NOTE — Telephone Encounter (Signed)
Patient called to ask a question about what is documented in her chart.  She says it is documented about diabetes Type II and she doesn't have this diagnosis.  She wants to speak with the nurse concerning this.

## 2016-09-17 NOTE — Telephone Encounter (Signed)
I spoke to patient. She states that there is a discrepancy between her 2 sleep studies, showing 2 different weights and 2 different neck sizes. I advised her that I will check on this for her.  Patient reported that in Dr. Guadelupe Sabin office notes, a history of depression and Diabetes/ pre-diabetes are included in the chart, she states that she does not and never has had this. And asked why we included this. I advised her that this is in her medical history, this is not something that we have added. These diagnosis were placed by another office within the [facility] system. Patient voiced understanding.

## 2016-09-18 NOTE — Telephone Encounter (Signed)
I called patient and advised her that Dr. Rexene Alberts has amended the 1st sleep study, neck size and weight was corrected. Also gave more detailed information about prior hx of Diabetes/pre-diabetes.

## 2016-10-29 ENCOUNTER — Other Ambulatory Visit: Payer: Self-pay | Admitting: Family Medicine

## 2016-11-13 ENCOUNTER — Telehealth: Payer: Self-pay

## 2016-11-13 NOTE — Telephone Encounter (Signed)
I called pt, left a detailed message on her cell phone number (per DPR) asking her to bring her cpap to the appt tomorrow and to call back with any questions.

## 2016-11-14 ENCOUNTER — Encounter: Payer: Self-pay | Admitting: Neurology

## 2016-11-14 ENCOUNTER — Ambulatory Visit (INDEPENDENT_AMBULATORY_CARE_PROVIDER_SITE_OTHER): Payer: Medicare Other | Admitting: Neurology

## 2016-11-14 ENCOUNTER — Encounter (INDEPENDENT_AMBULATORY_CARE_PROVIDER_SITE_OTHER): Payer: Self-pay

## 2016-11-14 VITALS — BP 156/68 | HR 100 | Resp 16 | Ht 67.0 in | Wt 264.0 lb

## 2016-11-14 DIAGNOSIS — Z9989 Dependence on other enabling machines and devices: Secondary | ICD-10-CM

## 2016-11-14 DIAGNOSIS — G4733 Obstructive sleep apnea (adult) (pediatric): Secondary | ICD-10-CM

## 2016-11-14 NOTE — Patient Instructions (Addendum)
Please continue using your CPAP regularly. While your insurance requires that you use CPAP at least 4 hours each night on 70% of the nights, I recommend, that you not skip any nights and use it throughout the night if you can. Getting used to CPAP and staying with the treatment long term does take time and patience and discipline. Untreated obstructive sleep apnea when it is moderate to severe can have an adverse impact on cardiovascular health and raise her risk for heart disease, arrhythmias, hypertension, congestive heart failure, stroke and diabetes. Untreated obstructive sleep apnea causes sleep disruption, nonrestorative sleep, and sleep deprivation. This can have an impact on your day to day functioning and cause daytime sleepiness and impairment of cognitive function, memory loss, mood disturbance, and problems focussing. Using CPAP regularly can improve these symptoms.  We will ask your DME provider to see if they can get a better seal on your mask.   Keep up the good work with your CPAP. I can see you back in one year.

## 2016-11-14 NOTE — Progress Notes (Signed)
Subjective:    Patient ID: Kelli Moss is a 66 y.o. female.  HPI     Interim history:  Kelli Moss is a 66 year old right-handed woman with an underlying medical history of type 2 diabetes, hyperlipidemia, hypertension, carpal tunnel syndrome, neck pain, peripheral neuropathy, and morbid obesity, who presents for follow-up consultation of her obstructive sleep apnea, after her recent sleep studies. The patient is unaccompanied today. I first met her on 07/26/2016, at the request of her primary care provider, at which time she reported a prior diagnosis of obstructive sleep apnea many years ago. She was placed on CPAP therapy but her CPAP machine broke. I invited her for a sleep study for reevaluation. She had a baseline sleep study, followed by a CPAP titration study. I went over her test results with her in detail today. Baseline sleep study from 08/03/2016 showed a sleep latency of 17 minutes, sleep efficiency 76.1%, wake after sleep onset was elevated at 95 minutes with 3 longer periods of wakefulness. She had an increased percentage of stage II sleep, near normal slow-wave sleep and REM sleep was minimal at 1.3%, REM latency markedly delayed at 325.5 minutes. She had mild PLMS with an index of 23.8 per hour, minimal arousals. Total AHI was 17 per hour, REM related AHI was severe at 40 per hour, supine AHI was 16.9. Average oxygen saturation was 96%, nadir was 82%. Based on her medical history and her test results I invited her for a full night CPAP titration study. She had this on 08/15/2016. Sleep efficiency was 84.4%, sleep latency 4 minutes, REM latency prolonged at 312 minutes, wake after sleep onset 56.5 minutes with 2 longer periods of wakefulness. She had an increased percentage of stage II sleep, slow-wave sleep was 23.8%, increased, REM sleep was reduced at 10.2%. She had no significant PLMS, average oxygen saturation was 95%, nadir was 84%. She was fitted with a full facemask. CPAP was  titrated from 7 cm to 15 cm, and a pressure of 15 cm her AHI was 0 with supine REM sleep achieved. Based on her test results are prescribed CPAP therapy for home use.  Today, 11/14/2016 (all dictated new, as well as above notes, some dictation done in note pad or Word, outside of chart, may appear as copied):  I reviewed her CPAP compliance data from 10/15/2016 through 11/13/2016 which is a total of 30 days, during which time she used her CPAP 29 days with percent used days greater than 4 hours at 97%, indicating excellent compliance with an average usage of 7 hours and 38 minutes, residual AHI at goal at 2.5 per hour, leak on the higher side at times with the 95th percentile at 29.7 L/m on a pressure of 15 cm with EPR of 2. She has adjusted well to treatment with her new CPAP machine. She reports no problem using her CPAP. She was fitted with a full facemask. She does note that it tends to leak at times. She has a daytime bite guard for TMJ and the nighttime bite guard for her TMJ. She has had no recent changes to her medical history or medications. She would like to see about following with her primary care physician for her sleep apnea from now on. I advised her to check with Dr. Dennard Schaumann on this. She would like for my office note to be faxed to her TMJ specialist in [place].   Previously (copied from previous notes for reference):   07/26/2016: She was previously diagnosed with  obstructive sleep apnea. She had a sleep study some 15 years ago placed on CPAP therapy. Prior sleep study results were reviewed today. She had a baseline sleep study on 10/14/2000. Her total AHI was 13.9 per hour. Average oxygen saturation was 92%, nadir was 71% during REM sleep. Her PLM index was 56.3 per hour, with very little arousals. She then return for a full night CPAP titration study as I understand. Those results are not available for my review. She has been using CPAP. CPAP compliance data is not available for my review.   She has had 2 CPAP machines. Her last machine broke. She has not used the CPAP in 1 month.Her mother passed away recently. She had obstructive sleep apnea. Her brother also has obstructive sleep apnea and uses a CPAP machine. Patient's Epworth sleepiness score is 9 out of 24 today, her fatigue score is 52/63. Bedtime and waketime varies: 9 PM, up by 3-4 AM.  Nocturia is about 1 time per night.  Of note, she has seen a TMJ specialist in [place], [place]. He recommended a repeat sleep study. She is in the process of getting a TMJ appliance from what I understand.   Her Past Medical History Is Significant For: Past Medical History:  Diagnosis Date  . Colon polyp   . CTS (carpal tunnel syndrome)   . Depression   . Diabetes mellitus without complication (HCC)    prediabetes  . Dyslipidemia   . Gastritis   . Hypertension   . Neuromuscular disorder ([place])    peripheral neuropathy    Her Past Surgical History Is Significant For: No past surgical history on file.  Her Family History Is Significant For: No family history on file.  Her Social History Is Significant For: Social History   Social History  . Marital status: Married    Spouse name: N/A  . Number of children: N/A  . Years of education: N/A   Social History Main Topics  . Smoking status: Former Smoker    Quit date: 01/23/1988  . Smokeless tobacco: Never Used  . Alcohol use No  . Drug use: No  . Sexual activity: Not Asked   Other Topics Concern  . None   Social History Narrative  . None    Her Allergies Are:  Allergies  Allergen Reactions  . Lyrica [Pregabalin]     nightmares  . Neurontin [Gabapentin]     Very whoozy  :   Her Current Medications Are:  Outpatient Encounter Prescriptions as of 11/14/2016  Medication Sig  . amLODipine (NORVASC) 10 MG tablet Take 1 tablet (10 mg total) by mouth daily.  Marland Kitchen aspirin 81 MG tablet Take 81 mg by mouth daily.  . benazepril (LOTENSIN) 40 MG tablet TAKE ONE TABLET BY MOUTH ONCE  DAILY  . chlorpheniramine-HYDROcodone (TUSSIONEX PENNKINETIC ER) 10-8 MG/5ML SUER Take 5 mLs by mouth every 12 (twelve) hours as needed for cough.  . ciclopirox (PENLAC) 8 % solution Apply topically at bedtime. Apply over nail and surrounding skin. Apply daily over previous coat. After seven (7) days, may remove with alcohol and continue cycle.  Marland Kitchen CINNAMON PO Take 2,000 mg by mouth daily.  . cyanocobalamin (,VITAMIN B-12,) 1000 MCG/ML injection INJECT 1 ML IN THE MUSCLE ONCE MONTHLY  . diazepam (VALIUM) 10 MG tablet Take 1 tablet (10 mg total) by mouth every 8 (eight) hours as needed (muscle spasms).  . Efinaconazole 10 % SOLN As directed for toenail fungus  . Flaxseed, Linseed, (FLAX SEEDS PO) Take  1 tablet by mouth daily.  . Grape Seed 100 MG CAPS Take by mouth.  Marland Kitchen LORazepam (ATIVAN) 1 MG tablet Take 1 tablet (1 mg total) by mouth at bedtime as needed.  . Magnesium 500 MG TABS Take 500 mg by mouth.  . Misc Natural Products (BLACK CHERRY CONCENTRATE PO) Take by mouth.  . oxyCODONE-acetaminophen (PERCOCET) 10-325 MG tablet Take 1 tablet by mouth every 6 (six) hours as needed for pain.  Vladimir Faster Glycol-Propyl Glycol (SYSTANE OP) Apply to eye.  . predniSONE (DELTASONE) 20 MG tablet 3 tabs poqday 1-2, 2 tabs poqday 3-4, 1 tab poqday 5-6  . tamsulosin (FLOMAX) 0.4 MG CAPS capsule Take 1 capsule (0.4 mg total) by mouth daily.  Marland Kitchen tiZANidine (ZANAFLEX) 4 MG tablet Take 1 tablet (4 mg total) by mouth every 8 (eight) hours as needed for muscle spasms.  . TURMERIC PO Take 1,000 mg by mouth.  Marland Kitchen UNABLE TO FIND Med Name: TENS UNIT DX: M54.2; M54.9   No facility-administered encounter medications on file as of 11/14/2016.   :  Review of Systems:  Out of a complete 14 point review of systems, all are reviewed and negative with the exception of these symptoms as listed below:  Review of Systems  Neurological:       Patient states she is doing ok with CPAP.     Objective:  Neurologic  Exam  Physical Exam Physical Examination:   Vitals:   11/14/16 1326  BP: (!) 156/68  Pulse: 100  Resp: 16   General Examination: The patient is a very pleasant 66 y.o. female in no acute distress. She appears well-developed and well-nourished and well groomed.   HEENT: Normocephalic, atraumatic, pupils are equal, round and reactive to light and accommodation. She has corrective eyeglasses. Extraocular tracking is good, no nystagmus is noted. Hearing is grossly intact. Speech is clear, she is wearing a bite guard, she is able to remove it for airway examination. She holds her neck very still. Tongue and palate are symmetrical. She has moderate airway crowding, findings are unchanged.  Chest: Clear to auscultation without wheezing, rhonchi or crackles noted.  Heart: S1+S2+0, regular and normal without murmurs, rubs or gallops noted.   Abdomen: Soft, non-tender and non-distended with normal bowel sounds appreciated on auscultation.  Skin: Warm and dry without trophic changes noted in the hands or face.   Musculoskeletal: exam reveals no obvious changes.   Neurologically:   Mental status: The patient is awake, alert and oriented in all 4 spheres. Her immediate and remote memory, attention, language skills and fund of knowledge are appropriate. There is no evidence of aphasia, agnosia, apraxia or anomia. Speech is clear with normal prosody and enunciation. Thought process is linear. Mood is normal and affect is normal.  Cranial nerves II - XII are as described above under HEENT exam. Motor exam: Normal bulk, strength and tone is noted. Fine motor skills and coordination: grossly intact.  Cerebellar testing: No gait ataxia.  Sensory exam: intact to light touch.  Gait, station and balance: She stands without obvious difficulty, she walks without difficulty today.  Assessment and plan:  In summary, Kelli Moss is a very pleasant 66 y.o.-year old female with an underlying medical history  of type 2 diabetes, hyperlipidemia, hypertension, carpal tunnel syndrome, neck pain, TMJ problems peripheral neuropathy, and morbid obesity, who presents for follow-up consultation of her obstructive sleep apnea, after her recent sleep studies for reevaluation. She carries a prior diagnosis of OSA and has been  on CPAP in the past, her machine broke and she needed reevaluation. She had a baseline sleep study and a CPAP titration study recently in December 2017 and we talked about the test results. She has adapted well to her new CPAP pressure and the mask and uses the machine well. She has no questions about the new CPAP machine. She is compliant with treatment and indicates no problems with sleep apnea at this time. She has been using a new bite guard for daytime and is separate one for nighttime through her TMJ specialist in [place]. She is advised to continue using her CPAP regularly and she is commended for her CPAP treatment adherence. I can see her back on a yearly basis, she would like to see about seeing her primary care physician for sleep apnea management. I encouraged her to check with Dr. Dennard Schaumann on this. I would be happy to see her yearly if needed. I answered all her questions today and she was in agreement.  I spent 17 minutes in total face-to-face time with the patient, more than 50% of which was spent in counseling and coordination of care, reviewing test results, reviewing medication and discussing or reviewing the diagnosis of OSA, its prognosis and treatment options. Pertinent laboratory and imaging test results that were available during this visit with the patient were reviewed by me and considered in my medical decision making (see chart for details).

## 2016-11-20 ENCOUNTER — Ambulatory Visit: Payer: Medicare Other | Admitting: Neurology

## 2016-12-25 ENCOUNTER — Other Ambulatory Visit: Payer: Self-pay | Admitting: Family Medicine

## 2016-12-25 DIAGNOSIS — G709 Myoneural disorder, unspecified: Secondary | ICD-10-CM

## 2016-12-25 DIAGNOSIS — Z79899 Other long term (current) drug therapy: Secondary | ICD-10-CM

## 2016-12-25 DIAGNOSIS — E119 Type 2 diabetes mellitus without complications: Secondary | ICD-10-CM

## 2016-12-25 DIAGNOSIS — E785 Hyperlipidemia, unspecified: Secondary | ICD-10-CM

## 2016-12-27 ENCOUNTER — Other Ambulatory Visit: Payer: Medicare Other

## 2016-12-27 DIAGNOSIS — G709 Myoneural disorder, unspecified: Secondary | ICD-10-CM

## 2016-12-27 DIAGNOSIS — E119 Type 2 diabetes mellitus without complications: Secondary | ICD-10-CM | POA: Diagnosis not present

## 2016-12-27 DIAGNOSIS — E785 Hyperlipidemia, unspecified: Secondary | ICD-10-CM | POA: Diagnosis not present

## 2016-12-27 DIAGNOSIS — Z79899 Other long term (current) drug therapy: Secondary | ICD-10-CM

## 2016-12-27 LAB — CBC WITH DIFFERENTIAL/PLATELET
Basophils Absolute: 0 cells/uL (ref 0–200)
Basophils Relative: 0 %
Eosinophils Absolute: 288 cells/uL (ref 15–500)
Eosinophils Relative: 3 %
HCT: 39.4 % (ref 35.0–45.0)
Hemoglobin: 13.1 g/dL (ref 12.0–15.0)
Lymphocytes Relative: 20 %
Lymphs Abs: 1920 cells/uL (ref 850–3900)
MCH: 28.4 pg (ref 27.0–33.0)
MCHC: 33.2 g/dL (ref 32.0–36.0)
MCV: 85.5 fL (ref 80.0–100.0)
MPV: 9 fL (ref 7.5–12.5)
Monocytes Absolute: 672 cells/uL (ref 200–950)
Monocytes Relative: 7 %
Neutro Abs: 6720 cells/uL (ref 1500–7800)
Neutrophils Relative %: 70 %
Platelets: 215 10*3/uL (ref 140–400)
RBC: 4.61 MIL/uL (ref 3.80–5.10)
RDW: 14.4 % (ref 11.0–15.0)
WBC: 9.6 10*3/uL (ref 3.8–10.8)

## 2016-12-27 LAB — COMPLETE METABOLIC PANEL WITH GFR
ALT: 11 U/L (ref 6–29)
AST: 13 U/L (ref 10–35)
Albumin: 4.1 g/dL (ref 3.6–5.1)
Alkaline Phosphatase: 66 U/L (ref 33–130)
BUN: 12 mg/dL (ref 7–25)
CO2: 21 mmol/L (ref 20–31)
Calcium: 9.1 mg/dL (ref 8.6–10.4)
Chloride: 104 mmol/L (ref 98–110)
Creat: 0.65 mg/dL (ref 0.50–0.99)
GFR, Est African American: >89 mL/min (ref >=60)
GFR, Est Non African American: >89 mL/min (ref >=60)
Glucose, Bld: 111 mg/dL — ABNORMAL HIGH (ref 70–99)
Potassium: 3.9 mmol/L (ref 3.5–5.3)
Sodium: 140 mmol/L (ref 135–146)
Total Bilirubin: 0.5 mg/dL (ref 0.2–1.2)
Total Protein: 7.3 g/dL (ref 6.1–8.1)

## 2016-12-27 LAB — LIPID PANEL
Cholesterol: 161 mg/dL (ref <200)
HDL: 29 mg/dL — ABNORMAL LOW (ref >50)
LDL Cholesterol: 84 mg/dL (ref <100)
Total CHOL/HDL Ratio: 5.6 Ratio — ABNORMAL HIGH (ref <5.0)
Triglycerides: 242 mg/dL — ABNORMAL HIGH (ref <150)
VLDL: 48 mg/dL — ABNORMAL HIGH (ref <30)

## 2016-12-27 LAB — TSH: TSH: 3.2 mIU/L

## 2016-12-28 ENCOUNTER — Other Ambulatory Visit: Payer: Self-pay | Admitting: Family Medicine

## 2016-12-28 DIAGNOSIS — Z1231 Encounter for screening mammogram for malignant neoplasm of breast: Secondary | ICD-10-CM

## 2016-12-28 LAB — VITAMIN D 25 HYDROXY (VIT D DEFICIENCY, FRACTURES): Vit D, 25-Hydroxy: 20 ng/mL — ABNORMAL LOW (ref 30–100)

## 2016-12-28 LAB — HEMOGLOBIN A1C
Hgb A1c MFr Bld: 5.2 % (ref <5.7)
Mean Plasma Glucose: 103 mg/dL

## 2016-12-31 ENCOUNTER — Encounter: Payer: Self-pay | Admitting: Family Medicine

## 2016-12-31 ENCOUNTER — Ambulatory Visit (INDEPENDENT_AMBULATORY_CARE_PROVIDER_SITE_OTHER): Payer: Medicare Other | Admitting: Family Medicine

## 2016-12-31 VITALS — BP 136/70 | HR 82 | Temp 98.3°F | Resp 18 | Ht 67.0 in | Wt 253.0 lb

## 2016-12-31 DIAGNOSIS — Z0000 Encounter for general adult medical examination without abnormal findings: Secondary | ICD-10-CM | POA: Diagnosis not present

## 2016-12-31 DIAGNOSIS — E669 Obesity, unspecified: Secondary | ICD-10-CM | POA: Diagnosis not present

## 2016-12-31 DIAGNOSIS — Z23 Encounter for immunization: Secondary | ICD-10-CM

## 2016-12-31 DIAGNOSIS — I10 Essential (primary) hypertension: Secondary | ICD-10-CM | POA: Diagnosis not present

## 2016-12-31 DIAGNOSIS — G4733 Obstructive sleep apnea (adult) (pediatric): Secondary | ICD-10-CM | POA: Diagnosis not present

## 2016-12-31 DIAGNOSIS — Z6830 Body mass index (BMI) 30.0-30.9, adult: Secondary | ICD-10-CM

## 2016-12-31 DIAGNOSIS — E8881 Metabolic syndrome: Secondary | ICD-10-CM | POA: Diagnosis not present

## 2016-12-31 DIAGNOSIS — Z9989 Dependence on other enabling machines and devices: Secondary | ICD-10-CM

## 2016-12-31 NOTE — Progress Notes (Signed)
Subjective:    Patient ID: Kelli Moss, female    DOB: [birth year], 66 y.o.   MRN: [id number]  HPI Patient is a 66 year old white female who is here today for complete physical exam.  Pap smear was performed in 2017.  She has a history of abdominal hysterectomy. She does still have her right ovary. Her colonoscopy is up to date (2015). Her mammogram is scheduled later this month. She declines a bone density test. She absolutely does not want me to schedule this. She is due for Pneumovax 23. The remainder of her immunizations are up-to-date. Her most recent lab work as listed below: Appointment on 12/27/2016  Component Date Value Ref Range Status  . WBC 12/27/2016 9.6  3.8 - 10.8 K/uL Final  . RBC 12/27/2016 4.61  3.80 - 5.10 MIL/uL Final  . Hemoglobin 12/27/2016 13.1  12.0 - 15.0 g/dL Final  . HCT 12/27/2016 39.4  35.0 - 45.0 % Final  . MCV 12/27/2016 85.5  80.0 - 100.0 fL Final  . MCH 12/27/2016 28.4  27.0 - 33.0 pg Final  . MCHC 12/27/2016 33.2  32.0 - 36.0 g/dL Final  . RDW 12/27/2016 14.4  11.0 - 15.0 % Final  . Platelets 12/27/2016 215  140 - 400 K/uL Final  . MPV 12/27/2016 9.0  7.5 - 12.5 fL Final  . Neutro Abs 12/27/2016 6720  1,500 - 7,800 cells/uL Final  . Lymphs Abs 12/27/2016 1920  850 - 3,900 cells/uL Final  . Monocytes Absolute 12/27/2016 672  200 - 950 cells/uL Final  . Eosinophils Absolute 12/27/2016 288  15 - 500 cells/uL Final  . Basophils Absolute 12/27/2016 0  0 - 200 cells/uL Final  . Neutrophils Relative % 12/27/2016 70  % Final  . Lymphocytes Relative 12/27/2016 20  % Final  . Monocytes Relative 12/27/2016 7  % Final  . Eosinophils Relative 12/27/2016 3  % Final  . Basophils Relative 12/27/2016 0  % Final  . Smear Review 12/27/2016 Criteria for review not met   Final  . Cholesterol 12/27/2016 161  <200 mg/dL Final  . Triglycerides 12/27/2016 242* <150 mg/dL Final  . HDL 12/27/2016 29* >50 mg/dL Final  . Total CHOL/HDL Ratio 12/27/2016 5.6* <5.0 Ratio Final    . VLDL 12/27/2016 48* <30 mg/dL Final  . LDL Cholesterol 12/27/2016 84  <100 mg/dL Final  . TSH 12/27/2016 3.20  mIU/L Final   Comment:   Reference Range   > or = 20 Years  0.40-4.50   Pregnancy Range First trimester  0.26-2.66 Second trimester 0.55-2.73 Third trimester  0.43-2.91     . Sodium 12/27/2016 140  135 - 146 mmol/L Final  . Potassium 12/27/2016 3.9  3.5 - 5.3 mmol/L Final  . Chloride 12/27/2016 104  98 - 110 mmol/L Final  . CO2 12/27/2016 21  20 - 31 mmol/L Final  . Glucose, Bld 12/27/2016 111* 70 - 99 mg/dL Final  . BUN 12/27/2016 12  7 - 25 mg/dL Final  . Creat 12/27/2016 0.65  0.50 - 0.99 mg/dL Final   Comment:   For patients > or = 66 years of age: The upper reference limit for Creatinine is approximately 13% higher for people identified as African-American.     . Total Bilirubin 12/27/2016 0.5  0.2 - 1.2 mg/dL Final  . Alkaline Phosphatase 12/27/2016 66  33 - 130 U/L Final  . AST 12/27/2016 13  10 - 35 U/L Final  . ALT 12/27/2016 11  6 - 29 U/L  Final  . Total Protein 12/27/2016 7.3  6.1 - 8.1 g/dL Final  . Albumin 12/27/2016 4.1  3.6 - 5.1 g/dL Final  . Calcium 12/27/2016 9.1  8.6 - 10.4 mg/dL Final  . GFR, Est African American 12/27/2016 >89  >=60 mL/min Final  . GFR, Est Non African American 12/27/2016 >89  >=60 mL/min Final  . Hgb A1c MFr Bld 12/27/2016 5.2  <5.7 % Final   Comment:   For the purpose of screening for the presence of diabetes:   <5.7%       Consistent with the absence of diabetes 5.7-6.4 %   Consistent with increased risk for diabetes (prediabetes) >=6.5 %     Consistent with diabetes   This assay result is consistent with a decreased risk of diabetes.   Currently, no consensus exists regarding use of hemoglobin A1c for diagnosis of diabetes in children.   According to American Diabetes Association (ADA) guidelines, hemoglobin A1c <7.0% represents optimal control in non-pregnant diabetic patients. Different metrics may apply to  specific patient populations. Standards of Medical Care in Diabetes (ADA).     . Mean Plasma Glucose 12/27/2016 103  mg/dL Final  . Vit D, 25-Hydroxy 12/27/2016 20* 30 - 100 ng/mL Final   Comment: Vitamin D Status           25-OH Vitamin D        Deficiency                <20 ng/mL        Insufficiency         20 - 29 ng/mL        Optimal             > or = 30 ng/mL   For 25-OH Vitamin D testing on patients on D2-supplementation and patients for whom quantitation of D2 and D3 fractions is required, the QuestAssureD 25-OH VIT D, (D2,D3), LC/MS/MS is recommended: order code [id number] (patients > 2 yrs).      Past Medical History:  Diagnosis Date  . Colon polyp   . CTS (carpal tunnel syndrome)   . Depression   . Diabetes mellitus without complication (HCC)    prediabetes  . Dyslipidemia   . Gastritis   . Hypertension   . Neuromuscular disorder ([place])    peripheral neuropathy   No past surgical history on file. Current Outpatient Prescriptions on File Prior to Visit  Medication Sig Dispense Refill  . amLODipine (NORVASC) 10 MG tablet Take 1 tablet (10 mg total) by mouth daily. 90 tablet 3  . aspirin 81 MG tablet Take 81 mg by mouth daily.    . benazepril (LOTENSIN) 40 MG tablet TAKE ONE TABLET BY MOUTH ONCE DAILY 90 tablet 2  . ciclopirox (PENLAC) 8 % solution Apply topically at bedtime. Apply over nail and surrounding skin. Apply daily over previous coat. After seven (7) days, may remove with alcohol and continue cycle. 6.6 mL 5  . CINNAMON PO Take 2,000 mg by mouth daily.    . cyanocobalamin (,VITAMIN B-12,) 1000 MCG/ML injection INJECT 1 ML IN THE MUSCLE ONCE MONTHLY 1 mL 0  . diazepam (VALIUM) 10 MG tablet Take 1 tablet (10 mg total) by mouth every 8 (eight) hours as needed (muscle spasms). 60 tablet 2  . Efinaconazole 10 % SOLN As directed for toenail fungus 8 mL 3  . Flaxseed, Linseed, (FLAX SEEDS PO) Take 1 tablet by mouth daily.    . Grape Seed 100 MG CAPS  Take by mouth.      Marland Kitchen LORazepam (ATIVAN) 1 MG tablet Take 1 tablet (1 mg total) by mouth at bedtime as needed. 90 tablet 0  . Magnesium 500 MG TABS Take 500 mg by mouth.    . Misc Natural Products (BLACK CHERRY CONCENTRATE PO) Take by mouth.    Vladimir Faster Glycol-Propyl Glycol (SYSTANE OP) Apply to eye.    Marland Kitchen tiZANidine (ZANAFLEX) 4 MG tablet Take 1 tablet (4 mg total) by mouth every 8 (eight) hours as needed for muscle spasms. 30 tablet 1  . TURMERIC PO Take 1,000 mg by mouth.    Marland Kitchen UNABLE TO FIND Med Name: TENS UNIT DX: M54.2; M54.9 1 Units 0   No current facility-administered medications on file prior to visit.    Allergies  Allergen Reactions  . Lyrica [Pregabalin]     nightmares  . Neurontin [Gabapentin]     Very whoozy   Social History   Social History  . Marital status: Married    Spouse name: N/A  . Number of children: N/A  . Years of education: N/A   Occupational History  . Not on file.   Social History Main Topics  . Smoking status: Former Smoker    Quit date: 01/23/1988  . Smokeless tobacco: Never Used  . Alcohol use No  . Drug use: No  . Sexual activity: Not on file   Other Topics Concern  . Not on file   Social History Narrative  . No narrative on file   No family history on file.    Review of Systems  All other systems reviewed and are negative.      Objective:   Physical Exam  Constitutional: She is oriented to person, place, and time. She appears well-developed and well-nourished. No distress.  HENT:  Head: Normocephalic and atraumatic.  Right Ear: External ear normal.  Left Ear: External ear normal.  Nose: Nose normal.  Mouth/Throat: Oropharynx is clear and moist. No oropharyngeal exudate.  Eyes: Conjunctivae and EOM are normal. Pupils are equal, round, and reactive to light. No scleral icterus.  Neck: Normal range of motion. Neck supple. No JVD present. No tracheal deviation present. No thyromegaly present.  Cardiovascular: Normal rate, regular rhythm and  normal heart sounds.  Exam reveals no gallop and no friction rub.   No murmur heard. Pulmonary/Chest: Effort normal and breath sounds normal. No stridor. No respiratory distress. She has no wheezes. She has no rales. She exhibits no tenderness.  Abdominal: Soft. Bowel sounds are normal. She exhibits no distension and no mass. There is no tenderness. There is no rebound and no guarding.  Musculoskeletal: Normal range of motion. She exhibits no edema or tenderness.  Lymphadenopathy:    She has no cervical adenopathy.  Neurological: She is alert and oriented to person, place, and time. She has normal reflexes. No cranial nerve deficit. She exhibits normal muscle tone. Coordination normal.  Skin: Skin is warm. No rash noted. She is not diaphoretic. No erythema. No pallor.  Psychiatric: Her behavior is normal. Judgment and thought content normal. Her mood appears anxious. Her affect is labile.  Vitals reviewed.         Assessment & Plan:  Need for prophylactic vaccination against Streptococcus pneumoniae (pneumococcus) - Plan: Pneumococcal polysaccharide vaccine 23-valent greater than or equal to 2yo subcutaneous/IM  Routine general medical examination at a health care facility  Metabolic syndrome  Benign essential HTN  OSA on CPAP  Class 1 obesity with serious comorbidity and  body mass index (BMI) of 30.0 to 30.9 in adult, unspecified obesity type  Mammogram is scheduled. Colonoscopy and Pap smear up-to-date. Patient refuses a bone density test. Immunizations are up-to-date. Patient received Pneumovax 23. She is compliant with her CPAP. Prediabetes is outstanding with a hemoglobin A1c of 5.2. Triglycerides remain elevated and HDL remains low but blood pressure remains well controlled. Continue stridor encourage aerobic exercise and weight loss. Patient will continue vitamin D 1000 units a day indefinitely.

## 2017-01-01 DIAGNOSIS — L718 Other rosacea: Secondary | ICD-10-CM | POA: Diagnosis not present

## 2017-01-01 DIAGNOSIS — D229 Melanocytic nevi, unspecified: Secondary | ICD-10-CM | POA: Diagnosis not present

## 2017-01-01 DIAGNOSIS — D1800 Hemangioma unspecified site: Secondary | ICD-10-CM | POA: Diagnosis not present

## 2017-01-01 DIAGNOSIS — L578 Other skin changes due to chronic exposure to nonionizing radiation: Secondary | ICD-10-CM | POA: Diagnosis not present

## 2017-01-01 DIAGNOSIS — Z1283 Encounter for screening for malignant neoplasm of skin: Secondary | ICD-10-CM | POA: Diagnosis not present

## 2017-01-01 DIAGNOSIS — L814 Other melanin hyperpigmentation: Secondary | ICD-10-CM | POA: Diagnosis not present

## 2017-01-01 DIAGNOSIS — L821 Other seborrheic keratosis: Secondary | ICD-10-CM | POA: Diagnosis not present

## 2017-01-01 DIAGNOSIS — B351 Tinea unguium: Secondary | ICD-10-CM | POA: Diagnosis not present

## 2017-01-07 ENCOUNTER — Ambulatory Visit (HOSPITAL_COMMUNITY)
Admission: RE | Admit: 2017-01-07 | Discharge: 2017-01-07 | Disposition: A | Discharge status: Home / Self Care | Payer: Medicare Other | Source: Ambulatory Visit | Attending: Family Medicine | Admitting: Family Medicine

## 2017-01-07 DIAGNOSIS — Z1231 Encounter for screening mammogram for malignant neoplasm of breast: Secondary | ICD-10-CM | POA: Diagnosis not present

## 2017-02-21 ENCOUNTER — Other Ambulatory Visit: Payer: Self-pay | Admitting: Family Medicine

## 2017-02-21 MED ORDER — AMLODIPINE BESYLATE 10 MG PO TABS: 10.0000 mg | ORAL_TABLET | Freq: Every day | ORAL | 3 refills | Status: DC

## 2017-02-25 ENCOUNTER — Other Ambulatory Visit: Payer: Self-pay | Admitting: Family Medicine

## 2017-02-25 MED ORDER — AMLODIPINE BESYLATE 10 MG PO TABS: 10.0000 mg | ORAL_TABLET | Freq: Every day | ORAL | 3 refills | Status: DC

## 2017-02-28 DIAGNOSIS — H2513 Age-related nuclear cataract, bilateral: Secondary | ICD-10-CM | POA: Diagnosis not present

## 2017-02-28 DIAGNOSIS — H35371 Puckering of macula, right eye: Secondary | ICD-10-CM | POA: Diagnosis not present

## 2017-06-04 ENCOUNTER — Ambulatory Visit
Admission: RE | Admit: 2017-06-04 | Discharge: 2017-06-04 | Disposition: A | Discharge status: Home / Self Care | Payer: Medicare Other | Source: Ambulatory Visit | Attending: Family Medicine | Admitting: Family Medicine

## 2017-06-04 ENCOUNTER — Encounter: Payer: Self-pay | Admitting: Family Medicine

## 2017-06-04 ENCOUNTER — Ambulatory Visit (INDEPENDENT_AMBULATORY_CARE_PROVIDER_SITE_OTHER): Payer: Medicare Other | Admitting: Family Medicine

## 2017-06-04 VITALS — BP 140/70 | HR 105 | Temp 98.5°F | Resp 16 | Ht 67.0 in | Wt 262.0 lb

## 2017-06-04 DIAGNOSIS — R091 Pleurisy: Secondary | ICD-10-CM

## 2017-06-04 DIAGNOSIS — H6591 Unspecified nonsuppurative otitis media, right ear: Secondary | ICD-10-CM

## 2017-06-04 DIAGNOSIS — J810 Acute pulmonary edema: Secondary | ICD-10-CM | POA: Diagnosis not present

## 2017-06-04 MED ORDER — LEVOFLOXACIN 500 MG PO TABS: 500.0000 mg | ORAL_TABLET | Freq: Every day | ORAL | 0 refills | Status: DC

## 2017-06-04 NOTE — Progress Notes (Signed)
Subjective:    Patient ID: Kelli Moss, female    DOB: 01-09-1951, 66 y.o.   MRN: [id number]  HPI  Patient is a 66 year old white female who presents with 3 weeks of cough. Cough is productive of yellow sputum. She reports left-sided pleurisy in the axillary line worse with coughing and with deep inspiration. She denies any shortness of breath although she is slightly tachycardic. Past medical history is significant for repeated plane flights between [place] in [place] to visit her family. She denies any shortness of breath. She denies any unilateral leg swelling or concern for DVT She does wear support hose while flying The cough has been present for 3 weeks and she denies any shortness of breath over last 3 weeks however she seems slightly winded today on exam She also reports head congestion and pain in her right ear.amination, the right tympanic membrane is erythematous dull with a middle ear effusion. Pulmonary exam is remarkable for diminished breath sounds in the left base but no crackles or rales.  Heart is in normal sinus rhythm. She is also taking benazepril for hypertension Past Medical History:  Diagnosis Date  . Colon polyp   . CTS (carpal tunnel syndrome)   . Depression   . Diabetes mellitus without complication (HCC)    prediabetes  . Dyslipidemia   . Gastritis   . Hypertension   . Neuromuscular disorder ([place])    peripheral neuropathy   No past surgical history on file. Current Outpatient Prescriptions on File Prior to Visit  Medication Sig Dispense Refill  . amLODipine (NORVASC) 10 MG tablet Take 1 tablet (10 mg total) by mouth daily. 90 tablet 3  . aspirin 81 MG tablet Take 81 mg by mouth daily.    . benazepril (LOTENSIN) 40 MG tablet TAKE ONE TABLET BY MOUTH ONCE DAILY 90 tablet 2  . ciclopirox (PENLAC) 8 % solution Apply topically at bedtime. Apply over nail and surrounding skin. Apply daily over previous coat. After seven (7) days, may remove with alcohol  and continue cycle. 6.6 mL 5  . CINNAMON PO Take 2,000 mg by mouth daily.    . cyanocobalamin (,VITAMIN B-12,) 1000 MCG/ML injection INJECT 1 ML IN THE MUSCLE ONCE MONTHLY 1 mL 0  . diazepam (VALIUM) 10 MG tablet Take 1 tablet (10 mg total) by mouth every 8 (eight) hours as needed (muscle spasms). 60 tablet 2  . Efinaconazole 10 % SOLN As directed for toenail fungus 8 mL 3  . Flaxseed, Linseed, (FLAX SEEDS PO) Take 1 tablet by mouth daily.    . Grape Seed 100 MG CAPS Take by mouth.    Marland Kitchen LORazepam (ATIVAN) 1 MG tablet Take 1 tablet (1 mg total) by mouth at bedtime as needed. 90 tablet 0  . Magnesium 500 MG TABS Take 500 mg by mouth.    . Misc Natural Products (BLACK CHERRY CONCENTRATE PO) Take by mouth.    Vladimir Faster Glycol-Propyl Glycol (SYSTANE OP) Apply to eye.    Marland Kitchen tiZANidine (ZANAFLEX) 4 MG tablet Take 1 tablet (4 mg total) by mouth every 8 (eight) hours as needed for muscle spasms. 30 tablet 1  . TURMERIC PO Take 1,000 mg by mouth.    Marland Kitchen UNABLE TO FIND Med Name: TENS UNIT DX: M54.2; M54.9 1 Units 0   No current facility-administered medications on file prior to visit.    Allergies  Allergen Reactions  . Lyrica [Pregabalin]     nightmares  . Neurontin [Gabapentin]  Very whoozy   Social History   Social History  . Marital status: Married    Spouse name: N/A  . Number of children: N/A  . Years of education: N/A   Occupational History  . Not on file.   Social History Main Topics  . Smoking status: Former Smoker    Quit date: 01/23/1988  . Smokeless tobacco: Never Used  . Alcohol use No  . Drug use: No  . Sexual activity: Not on file   Other Topics Concern  . Not on file   Social History Narrative  . No narrative on file     Review of Systems  All other systems reviewed and are negative.      Objective:   Physical Exam  Constitutional: She appears well-developed and well-nourished. No distress.  HENT:  Right Ear: Tympanic membrane is injected, erythematous  and bulging.  Left Ear: Tympanic membrane normal.  Cardiovascular: Normal rate, regular rhythm and normal heart sounds.   No murmur heard. Pulmonary/Chest: Effort normal. No respiratory distress. She has no wheezes. She has no rales. She exhibits no tenderness.  Abdominal: Soft. Bowel sounds are normal. She exhibits no distension. There is no tenderness. There is no rebound and no guarding.  Musculoskeletal: She exhibits no edema.  Skin: She is not diaphoretic.  Vitals reviewed.         Assessment & Plan:  Pleurisy - Plan: DG Chest 2 View, levofloxacin (LEVAQUIN) 500 MG tablet  Right otitis media with effusion  Patient has a right-sided ear infection as well as what appears to be bacterial bronchitis. Pneumonia, ACE induced cough, pulmonary embolism or less likely. I will start the patient on Levaquin 500 milligrams a day by mouth for 7 days to cover for right-sided otitis media as well as bronchitis and possibly community-acquired pneumonia. I will send the patient for a chest x-ray. If chest x-ray shows no pneumonia, I will also hathe patient discontinue benazepril and case this is contributing to her cough. I believe PE is unlikely despite her risk factors given her relatively normal exam and the fact the cough is been present for 3 weeks

## 2017-06-07 ENCOUNTER — Other Ambulatory Visit: Payer: Self-pay | Admitting: Family Medicine

## 2017-06-07 NOTE — Telephone Encounter (Signed)
Ok to refill??      Tizanidine

## 2017-06-07 NOTE — Telephone Encounter (Signed)
ok 

## 2017-07-05 ENCOUNTER — Telehealth: Payer: Self-pay | Admitting: Family Medicine

## 2017-07-05 NOTE — Telephone Encounter (Signed)
Pt called [facility] stating that you took her off her Benazepril at LOV d/t cough. She would like to know if she needs to restart or do you want to change her medication or just stay off of it?

## 2017-07-08 ENCOUNTER — Other Ambulatory Visit: Payer: Self-pay | Admitting: Family Medicine

## 2017-07-08 MED ORDER — LOSARTAN POTASSIUM 100 MG PO TABS: 100.0000 mg | ORAL_TABLET | Freq: Every day | ORAL | 1 refills | Status: DC

## 2017-07-08 NOTE — Telephone Encounter (Signed)
Cough improved and new med called in

## 2017-07-08 NOTE — Telephone Encounter (Signed)
Switch to losartan 100 mg poqday, is cough better

## 2017-12-25 ENCOUNTER — Other Ambulatory Visit: Payer: Self-pay | Admitting: Family Medicine

## 2018-04-02 ENCOUNTER — Other Ambulatory Visit: Payer: Self-pay | Admitting: Family Medicine

## 2018-06-05 ENCOUNTER — Other Ambulatory Visit: Payer: Medicare Other

## 2018-06-05 DIAGNOSIS — E785 Hyperlipidemia, unspecified: Secondary | ICD-10-CM | POA: Diagnosis not present

## 2018-06-05 DIAGNOSIS — I10 Essential (primary) hypertension: Secondary | ICD-10-CM | POA: Diagnosis not present

## 2018-06-05 DIAGNOSIS — Z0000 Encounter for general adult medical examination without abnormal findings: Secondary | ICD-10-CM | POA: Diagnosis not present

## 2018-06-05 LAB — COMPREHENSIVE METABOLIC PANEL
AG Ratio: 1.5 (calc) (ref 1.0–2.5)
ALT: 19 U/L (ref 6–29)
AST: 17 U/L (ref 10–35)
Albumin: 4.1 g/dL (ref 3.6–5.1)
Alkaline phosphatase (APISO): 66 U/L (ref 33–130)
BUN: 10 mg/dL (ref 7–25)
CO2: 22 mmol/L (ref 20–32)
Calcium: 8.8 mg/dL (ref 8.6–10.4)
Chloride: 106 mmol/L (ref 98–110)
Creat: 0.55 mg/dL (ref 0.50–0.99)
Globulin: 2.8 g/dL (calc) (ref 1.9–3.7)
Glucose, Bld: 117 mg/dL — ABNORMAL HIGH (ref 65–99)
Potassium: 4.1 mmol/L (ref 3.5–5.3)
Sodium: 139 mmol/L (ref 135–146)
Total Bilirubin: 0.4 mg/dL (ref 0.2–1.2)
Total Protein: 6.9 g/dL (ref 6.1–8.1)

## 2018-06-05 LAB — LIPID PANEL
Cholesterol: 142 mg/dL (ref <200)
HDL: 31 mg/dL — ABNORMAL LOW (ref >50)
LDL Cholesterol (Calc): 69 mg/dL (calc)
Non-HDL Cholesterol (Calc): 111 mg/dL (calc) (ref <130)
Total CHOL/HDL Ratio: 4.6 (calc) (ref <5.0)
Triglycerides: 350 mg/dL — ABNORMAL HIGH (ref <150)

## 2018-06-05 LAB — CBC WITH DIFFERENTIAL/PLATELET
Basophils Absolute: 64 cells/uL (ref 0–200)
Basophils Relative: 0.8 %
Eosinophils Absolute: 264 cells/uL (ref 15–500)
Eosinophils Relative: 3.3 %
HCT: 42.3 % (ref 35.0–45.0)
Hemoglobin: 14 g/dL (ref 11.7–15.5)
Lymphs Abs: 2168 cells/uL (ref 850–3900)
MCH: 27.6 pg (ref 27.0–33.0)
MCHC: 33.1 g/dL (ref 32.0–36.0)
MCV: 83.3 fL (ref 80.0–100.0)
MPV: 9.8 fL (ref 7.5–12.5)
Monocytes Relative: 8.4 %
Neutro Abs: 4832 cells/uL (ref 1500–7800)
Neutrophils Relative %: 60.4 %
Platelets: 242 10*3/uL (ref 140–400)
RBC: 5.08 10*6/uL (ref 3.80–5.10)
RDW: 13.7 % (ref 11.0–15.0)
Total Lymphocyte: 27.1 %
WBC mixed population: 672 cells/uL (ref 200–950)
WBC: 8 10*3/uL (ref 3.8–10.8)

## 2018-06-12 ENCOUNTER — Encounter: Payer: Self-pay | Admitting: Family Medicine

## 2018-06-12 ENCOUNTER — Ambulatory Visit (INDEPENDENT_AMBULATORY_CARE_PROVIDER_SITE_OTHER): Payer: Medicare Other | Admitting: Family Medicine

## 2018-06-12 VITALS — BP 144/60 | HR 96 | Temp 98.1°F | Resp 18 | Ht 67.0 in | Wt 277.0 lb

## 2018-06-12 DIAGNOSIS — E785 Hyperlipidemia, unspecified: Secondary | ICD-10-CM | POA: Diagnosis not present

## 2018-06-12 DIAGNOSIS — E8881 Metabolic syndrome: Secondary | ICD-10-CM | POA: Diagnosis not present

## 2018-06-12 DIAGNOSIS — N3000 Acute cystitis without hematuria: Secondary | ICD-10-CM | POA: Diagnosis not present

## 2018-06-12 DIAGNOSIS — Z0000 Encounter for general adult medical examination without abnormal findings: Secondary | ICD-10-CM

## 2018-06-12 DIAGNOSIS — Z23 Encounter for immunization: Secondary | ICD-10-CM

## 2018-06-12 DIAGNOSIS — G6289 Other specified polyneuropathies: Secondary | ICD-10-CM | POA: Diagnosis not present

## 2018-06-12 DIAGNOSIS — I10 Essential (primary) hypertension: Secondary | ICD-10-CM

## 2018-06-12 DIAGNOSIS — R079 Chest pain, unspecified: Secondary | ICD-10-CM

## 2018-06-12 DIAGNOSIS — R300 Dysuria: Secondary | ICD-10-CM

## 2018-06-12 LAB — URINALYSIS, ROUTINE W REFLEX MICROSCOPIC
Bilirubin Urine: NEGATIVE
Glucose, UA: NEGATIVE
Ketones, ur: NEGATIVE
Nitrite: NEGATIVE
Protein, ur: NEGATIVE
Specific Gravity, Urine: 1.015 (ref 1.001–1.03)
pH: 6 (ref 5.0–8.0)

## 2018-06-12 LAB — MICROSCOPIC MESSAGE

## 2018-06-12 MED ORDER — GABAPENTIN 100 MG PO CAPS: 100.0000 mg | ORAL_CAPSULE | Freq: Three times a day (TID) | ORAL | 3 refills | Status: DC

## 2018-06-12 MED ORDER — SULFAMETHOXAZOLE-TRIMETHOPRIM 800-160 MG PO TABS: 1.0000 | ORAL_TABLET | Freq: Two times a day (BID) | ORAL | 0 refills | Status: DC

## 2018-06-12 MED ORDER — CYANOCOBALAMIN 1000 MCG/ML IJ SOLN: INTRAMUSCULAR | 3 refills | Status: DC

## 2018-06-12 NOTE — Progress Notes (Signed)
Subjective:    Patient ID: Kelli Moss, female    DOB: June 09, 1951, 67 y.o.   MRN: [id number]  HPI Patient is a 67 year old white female who is here today for complete physical exam.  Pap smear was performed in 2017.  She has a history of anhysterectomy. She does still have her right ovary. Her colonoscopy is up to date (2015).  Patient absolutely refuses a bone density test.  She declines a mammogram at the present time.  She has 2 concerns today.  #1 she is requesting a handicap sticker.  She has a great deal of pain in her feet with ambulation.  She has peripheral neuropathy.  She states that her feet constantly feel like they are on fire and is difficult for her to walk long distances particularly at places like the airport, malls, etc.  She feels like she would benefit from a handicap sticker so that she can park closer and be able to walk inside.  Second issue is dysuria.  Urinalysis is listed below but does show +2 leukocyte esterase.  She reports increasing urinary frequency and urgency over the last week.  Past medical history significant for metabolic syndrome.  Her most recent lab work as listed below: Continues to show metabolic syndrome with a low HDL cholesterol, prediabetic range blood sugar, and an elevated blood pressure.  She also qualifies due to abdominal circumference.  She is not currently monitoring her diet or performing any regular aerobic exercise.  However while having our discussion, the patient admits that she is been getting chest pain.  It substernal in location.  It "grabs her".  It can occur at rest or with activity.  It can last minutes.  She also reports some shortness of breath with it.  Occasionally the pain radiates down her left arm and into her left neck.  She states that this has gotten worse over the last 6 months and is happening more frequently.  She is extremely sedentary and therefore she denies any angina.  However she has risk factors including a brother  with a history of coronary artery disease, hypertension, and metabolic syndrome.  She also has a remote history of smoking. Office Visit on 06/12/2018  Component Date Value Ref Range Status  . Color, Urine 06/12/2018 YELLOW  YELLOW Final  . APPearance 06/12/2018 CLOUDY* CLEAR Final  . Specific Gravity, Urine 06/12/2018 1.015  1.001 - 1.03 Final  . pH 06/12/2018 6.0  5.0 - 8.0 Final  . Glucose, UA 06/12/2018 NEGATIVE  NEGATIVE Final  . Bilirubin Urine 06/12/2018 NEGATIVE  NEGATIVE Final  . Ketones, ur 06/12/2018 NEGATIVE  NEGATIVE Final  . Hgb urine dipstick 06/12/2018 TRACE* NEGATIVE Final  . Protein, ur 06/12/2018 NEGATIVE  NEGATIVE Final  . Nitrite 06/12/2018 NEGATIVE  NEGATIVE Final  . Leukocytes, UA 06/12/2018 2+* NEGATIVE Final  . WBC, UA 06/12/2018 20-40* 0 - 5 /HPF Final  . RBC / HPF 06/12/2018 0-2  0 - 2 /HPF Final  . Squamous Epithelial / LPF 06/12/2018 0-5  < OR = 5 /HPF Final  . Bacteria, UA 06/12/2018 MODERATE* NONE SEEN /HPF Final  . Note 06/12/2018    Final   Comment: This urine was analyzed for the presence of WBC,  RBC, bacteria, casts, and other formed elements.  Only those elements seen were reported. . .   Lab on 06/05/2018  Component Date Value Ref Range Status  . WBC 06/05/2018 8.0  3.8 - 10.8 Thousand/uL Final  . RBC 06/05/2018 5.08  3.80 - 5.10 Million/uL Final  . Hemoglobin 06/05/2018 14.0  11.7 - 15.5 g/dL Final  . HCT 06/05/2018 42.3  35.0 - 45.0 % Final  . MCV 06/05/2018 83.3  80.0 - 100.0 fL Final  . MCH 06/05/2018 27.6  27.0 - 33.0 pg Final  . MCHC 06/05/2018 33.1  32.0 - 36.0 g/dL Final  . RDW 06/05/2018 13.7  11.0 - 15.0 % Final  . Platelets 06/05/2018 242  140 - 400 Thousand/uL Final  . MPV 06/05/2018 9.8  7.5 - 12.5 fL Final  . Neutro Abs 06/05/2018 4,832  1,500 - 7,800 cells/uL Final  . Lymphs Abs 06/05/2018 2,168  850 - 3,900 cells/uL Final  . WBC mixed population 06/05/2018 672  200 - 950 cells/uL Final  . Eosinophils Absolute 06/05/2018 264   15 - 500 cells/uL Final  . Basophils Absolute 06/05/2018 64  0 - 200 cells/uL Final  . Neutrophils Relative % 06/05/2018 60.4  % Final  . Total Lymphocyte 06/05/2018 27.1  % Final  . Monocytes Relative 06/05/2018 8.4  % Final  . Eosinophils Relative 06/05/2018 3.3  % Final  . Basophils Relative 06/05/2018 0.8  % Final  . Cholesterol 06/05/2018 142  <200 mg/dL Final  . HDL 06/05/2018 31* >50 mg/dL Final  . Triglycerides 06/05/2018 350* <150 mg/dL Final   Comment: . If a non-fasting specimen was collected, consider repeat triglyceride testing on a fasting specimen if clinically indicated.  Yates Decamp et al. J. of Clin. Lipidol. 1448;1:856-314. .   . LDL Cholesterol (Calc) 06/05/2018 69  mg/dL (calc) Final   Comment: Reference range: <100 . Desirable range <100 mg/dL for primary prevention;   <70 mg/dL for patients with CHD or diabetic patients  with > or = 2 CHD risk factors. Marland Kitchen LDL-C is now calculated using the Martin-Hopkins  calculation, which is a validated novel method providing  better accuracy than the Friedewald equation in the  estimation of LDL-C.  Cresenciano Genre et al. Annamaria Helling. 9702;637(85): 2061-2068  (http://education.[facility].com/faq/FAQ164)   . Total CHOL/HDL Ratio 06/05/2018 4.6  <5.0 (calc) Final  . Non-HDL Cholesterol (Calc) 06/05/2018 111  <130 mg/dL (calc) Final   Comment: For patients with diabetes plus 1 major ASCVD risk  factor, treating to a non-HDL-C goal of <100 mg/dL  (LDL-C of <70 mg/dL) is considered a therapeutic  option.   . Glucose, Bld 06/05/2018 117* 65 - 99 mg/dL Final   Comment: .            Fasting reference interval . For someone without known diabetes, a glucose value between 100 and 125 mg/dL is consistent with prediabetes and should be confirmed with a follow-up test. .   . BUN 06/05/2018 10  7 - 25 mg/dL Final  . Creat 06/05/2018 0.55  0.50 - 0.99 mg/dL Final   Comment: For patients >57 years of age, the reference limit for  Creatinine is approximately 13% higher for people identified as African-American. .   Havery Moros Ratio 88/50/2774 NOT APPLICABLE  6 - 22 (calc) Final  . Sodium 06/05/2018 139  135 - 146 mmol/L Final  . Potassium 06/05/2018 4.1  3.5 - 5.3 mmol/L Final  . Chloride 06/05/2018 106  98 - 110 mmol/L Final  . CO2 06/05/2018 22  20 - 32 mmol/L Final  . Calcium 06/05/2018 8.8  8.6 - 10.4 mg/dL Final  . Total Protein 06/05/2018 6.9  6.1 - 8.1 g/dL Final  . Albumin 06/05/2018 4.1  3.6 - 5.1 g/dL Final  . Globulin  06/05/2018 2.8  1.9 - 3.7 g/dL (calc) Final  . AG Ratio 06/05/2018 1.5  1.0 - 2.5 (calc) Final  . Total Bilirubin 06/05/2018 0.4  0.2 - 1.2 mg/dL Final  . Alkaline phosphatase (APISO) 06/05/2018 66  33 - 130 U/L Final  . AST 06/05/2018 17  10 - 35 U/L Final  . ALT 06/05/2018 19  6 - 29 U/L Final     Past Medical History:  Diagnosis Date  . Colon polyp   . CTS (carpal tunnel syndrome)   . Depression   . Diabetes mellitus without complication (HCC)    prediabetes  . Dyslipidemia   . Gastritis   . Hypertension   . Neuromuscular disorder ([place])    peripheral neuropathy   No past surgical history on file. Current Outpatient Medications on File Prior to Visit  Medication Sig Dispense Refill  . amLODipine (NORVASC) 10 MG tablet TAKE 1 TABLET BY MOUTH ONCE DAILY 90 tablet 3  . aspirin 81 MG tablet Take 81 mg by mouth daily.    . ciclopirox (PENLAC) 8 % solution Apply topically at bedtime. Apply over nail and surrounding skin. Apply daily over previous coat. After seven (7) days, may remove with alcohol and continue cycle. 6.6 mL 5  . CINNAMON PO Take 2,000 mg by mouth daily.    . diazepam (VALIUM) 10 MG tablet Take 1 tablet (10 mg total) by mouth every 8 (eight) hours as needed (muscle spasms). 60 tablet 2  . Efinaconazole 10 % SOLN As directed for toenail fungus 8 mL 3  . Flaxseed, Linseed, (FLAX SEEDS PO) Take 1 tablet by mouth daily.    . Grape Seed 100 MG CAPS Take by  mouth.    . levofloxacin (LEVAQUIN) 500 MG tablet Take 1 tablet (500 mg total) by mouth daily. 7 tablet 0  . LORazepam (ATIVAN) 1 MG tablet Take 1 tablet (1 mg total) by mouth at bedtime as needed. 90 tablet 0  . losartan (COZAAR) 100 MG tablet TAKE 1 TABLET BY MOUTH ONCE DAILY 90 tablet 1  . Magnesium 500 MG TABS Take 500 mg by mouth.    . Misc Natural Products (BLACK CHERRY CONCENTRATE PO) Take by mouth.    Vladimir Faster Glycol-Propyl Glycol (SYSTANE OP) Apply to eye.    Marland Kitchen tiZANidine (ZANAFLEX) 4 MG tablet TAKE ONE TABLET BY MOUTH EVERY 8 HOURS AS NEEDED FOR  MUSCLE  SPASMS 30 tablet 2  . TURMERIC PO Take 1,000 mg by mouth.    Marland Kitchen UNABLE TO FIND Med Name: TENS UNIT DX: M54.2; M54.9 1 Units 0   No current facility-administered medications on file prior to visit.    Allergies  Allergen Reactions  . Lyrica [Pregabalin]     nightmares  . Neurontin [Gabapentin]     Very whoozy   Social History   Socioeconomic History  . Marital status: Married    Spouse name: Not on file  . Number of children: Not on file  . Years of education: Not on file  . Highest education level: Not on file  Occupational History  . Not on file  Social Needs  . Financial resource strain: Not on file  . Food insecurity:    Worry: Not on file    Inability: Not on file  . Transportation needs:    Medical: Not on file    Non-medical: Not on file  Tobacco Use  . Smoking status: Former Smoker    Last attempt to quit: 01/23/1988  Years since quitting: 30.4  . Smokeless tobacco: Never Used  Substance and Sexual Activity  . Alcohol use: No  . Drug use: No  . Sexual activity: Not on file  Lifestyle  . Physical activity:    Days per week: Not on file    Minutes per session: Not on file  . Stress: Not on file  Relationships  . Social connections:    Talks on phone: Not on file    Gets together: Not on file    Attends religious service: Not on file    Active member of club or organization: Not on file     Attends meetings of clubs or organizations: Not on file    Relationship status: Not on file  . Intimate partner violence:    Fear of current or ex partner: Not on file    Emotionally abused: Not on file    Physically abused: Not on file    Forced sexual activity: Not on file  Other Topics Concern  . Not on file  Social History Narrative  . Not on file   No family history on file.    Review of Systems  All other systems reviewed and are negative.      Objective:   Physical Exam  Constitutional: She is oriented to person, place, and time. She appears well-developed and well-nourished. No distress.  HENT:  Head: Normocephalic and atraumatic.  Right Ear: External ear normal.  Left Ear: External ear normal.  Nose: Nose normal.  Mouth/Throat: Oropharynx is clear and moist. No oropharyngeal exudate.  Eyes: Pupils are equal, round, and reactive to light. Conjunctivae and EOM are normal. No scleral icterus.  Neck: Normal range of motion. Neck supple. No JVD present. No tracheal deviation present. No thyromegaly present.  Cardiovascular: Normal rate, regular rhythm and normal heart sounds. Exam reveals no gallop and no friction rub.  No murmur heard. Pulmonary/Chest: Effort normal and breath sounds normal. No stridor. No respiratory distress. She has no wheezes. She has no rales. She exhibits no tenderness.  Abdominal: Soft. Bowel sounds are normal. She exhibits no distension and no mass. There is no tenderness. There is no rebound and no guarding.  Musculoskeletal: Normal range of motion. She exhibits no edema or tenderness.  Lymphadenopathy:    She has no cervical adenopathy.  Neurological: She is alert and oriented to person, place, and time. She has normal reflexes. No cranial nerve deficit. She exhibits normal muscle tone. Coordination normal.  Skin: Skin is warm. No rash noted. She is not diaphoretic. No erythema. No pallor.  Psychiatric: Her behavior is normal. Judgment and  thought content normal. Her mood appears anxious. Her affect is labile.  Vitals reviewed.         Assessment & Plan:  Dysuria - Plan: Urinalysis, Routine w reflex microscopic  Need for prophylactic vaccination against Streptococcus pneumoniae (pneumococcus) and influenza - Plan: Flu vaccine HIGH DOSE PF, Pneumococcal conjugate vaccine 13-valent IM  Routine general medical examination at a health care facility  Metabolic syndrome  Benign essential HTN  Dyslipidemia  Other polyneuropathy  Chest pain, unspecified type - Plan: Ambulatory referral to Cardiology  Acute cystitis without hematuria  I recommended a mammogram and a bone density test however the patient declined these at the present time.  She does not require Pap smear  I will treat her urinary tract infection with Bactrim 1 tablet p.o. twice daily for 3 days.  I recommended trying gabapentin 100 mg p.o. 3 times daily for  peripheral neuropathy.  I explained to the patient the importance of avoiding a sedentary lifestyle and exercise and treating metabolic syndrome.  Therefore I want to try to better manage her pain so that she can become more active.  However I will allow a handicap placard.  I am concerned about the chest pain.  She is extremely sedentary.  I recommended given her metabolic syndrome, the worsening frequency of the chest pain, the radiation into her left arm, the occasional shortness of breath, her hypertension, her history of smoking, a cardiology consultation for a stress test to rule out underlying ischemic morning artery disease.  She would like to see Dr. Tamala Julian whom she has seen previously in the past.  Patient received her flu shot today along with her second pneumonia vaccine.  Await the results of the cardiology consultation

## 2018-06-19 ENCOUNTER — Other Ambulatory Visit: Payer: Self-pay | Admitting: Family Medicine

## 2018-06-19 DIAGNOSIS — Z1231 Encounter for screening mammogram for malignant neoplasm of breast: Secondary | ICD-10-CM

## 2018-06-25 ENCOUNTER — Encounter (HOSPITAL_COMMUNITY): Payer: Self-pay

## 2018-06-25 ENCOUNTER — Ambulatory Visit (HOSPITAL_COMMUNITY)
Admission: RE | Admit: 2018-06-25 | Discharge: 2018-06-25 | Disposition: A | Discharge status: Home / Self Care | Payer: Medicare Other | Source: Ambulatory Visit | Attending: Family Medicine | Admitting: Family Medicine

## 2018-06-25 DIAGNOSIS — Z1231 Encounter for screening mammogram for malignant neoplasm of breast: Secondary | ICD-10-CM | POA: Insufficient documentation

## 2018-06-25 DIAGNOSIS — L820 Inflamed seborrheic keratosis: Secondary | ICD-10-CM | POA: Diagnosis not present

## 2018-06-25 DIAGNOSIS — D1800 Hemangioma unspecified site: Secondary | ICD-10-CM | POA: Diagnosis not present

## 2018-06-25 DIAGNOSIS — D225 Melanocytic nevi of trunk: Secondary | ICD-10-CM | POA: Diagnosis not present

## 2018-06-25 DIAGNOSIS — L821 Other seborrheic keratosis: Secondary | ICD-10-CM | POA: Diagnosis not present

## 2018-06-25 DIAGNOSIS — L719 Rosacea, unspecified: Secondary | ICD-10-CM | POA: Diagnosis not present

## 2018-06-25 DIAGNOSIS — Z1283 Encounter for screening for malignant neoplasm of skin: Secondary | ICD-10-CM | POA: Diagnosis not present

## 2018-06-25 DIAGNOSIS — B351 Tinea unguium: Secondary | ICD-10-CM | POA: Diagnosis not present

## 2018-06-25 DIAGNOSIS — L578 Other skin changes due to chronic exposure to nonionizing radiation: Secondary | ICD-10-CM | POA: Diagnosis not present

## 2018-07-01 ENCOUNTER — Telehealth: Payer: Self-pay | Admitting: Family Medicine

## 2018-07-01 NOTE — Telephone Encounter (Signed)
Up gabapentin to 300 mg p.o. 3 times daily

## 2018-07-01 NOTE — Telephone Encounter (Signed)
Pt called and states that you had talked about increasing her Gabapentin and she would like to do that. Please advise increase dosing.

## 2018-07-02 ENCOUNTER — Other Ambulatory Visit: Payer: Self-pay | Admitting: Family Medicine

## 2018-07-02 MED ORDER — CYANOCOBALAMIN 1000 MCG/ML IJ SOLN: INTRAMUSCULAR | 3 refills | Status: DC

## 2018-07-02 MED ORDER — GABAPENTIN 300 MG PO CAPS: 300.0000 mg | ORAL_CAPSULE | Freq: Three times a day (TID) | ORAL | 3 refills | Status: DC

## 2018-07-02 NOTE — Telephone Encounter (Signed)
Pt aware and med sent to pharm 

## 2018-07-04 ENCOUNTER — Other Ambulatory Visit: Payer: Self-pay | Admitting: Family Medicine

## 2018-07-04 ENCOUNTER — Telehealth: Payer: Self-pay | Admitting: Family Medicine

## 2018-07-04 MED ORDER — GABAPENTIN 300 MG PO CAPS: 300.0000 mg | ORAL_CAPSULE | Freq: Three times a day (TID) | ORAL | 3 refills | Status: DC

## 2018-07-04 NOTE — Telephone Encounter (Signed)
Yes I went ahead and send a 90-day supply to [facility]

## 2018-07-04 NOTE — Telephone Encounter (Signed)
Patient called requesting a 90 day supply on her Gabapentin. Please advise?

## 2018-07-04 NOTE — Telephone Encounter (Signed)
Spoke with patient and informed her that medication was sent into pharmacy. Patient verbalized understanding.

## 2018-07-31 ENCOUNTER — Ambulatory Visit (INDEPENDENT_AMBULATORY_CARE_PROVIDER_SITE_OTHER): Payer: Medicare Other | Admitting: Cardiovascular Disease

## 2018-07-31 ENCOUNTER — Encounter (INDEPENDENT_AMBULATORY_CARE_PROVIDER_SITE_OTHER): Payer: Self-pay

## 2018-07-31 ENCOUNTER — Encounter

## 2018-07-31 ENCOUNTER — Encounter: Payer: Self-pay | Admitting: Cardiovascular Disease

## 2018-07-31 VITALS — BP 152/74 | HR 95 | Ht 65.0 in | Wt 279.4 lb

## 2018-07-31 DIAGNOSIS — E782 Mixed hyperlipidemia: Secondary | ICD-10-CM | POA: Diagnosis not present

## 2018-07-31 DIAGNOSIS — R609 Edema, unspecified: Secondary | ICD-10-CM | POA: Diagnosis not present

## 2018-07-31 DIAGNOSIS — R600 Localized edema: Secondary | ICD-10-CM

## 2018-07-31 DIAGNOSIS — E8881 Metabolic syndrome: Secondary | ICD-10-CM

## 2018-07-31 DIAGNOSIS — Z01812 Encounter for preprocedural laboratory examination: Secondary | ICD-10-CM | POA: Diagnosis not present

## 2018-07-31 DIAGNOSIS — I10 Essential (primary) hypertension: Secondary | ICD-10-CM

## 2018-07-31 DIAGNOSIS — G4733 Obstructive sleep apnea (adult) (pediatric): Secondary | ICD-10-CM

## 2018-07-31 DIAGNOSIS — R079 Chest pain, unspecified: Secondary | ICD-10-CM

## 2018-07-31 MED ORDER — ICOSAPENT ETHYL 1 G PO CAPS: 2.0000 g | ORAL_CAPSULE | Freq: Two times a day (BID) | ORAL | 3 refills | Status: DC

## 2018-07-31 MED ORDER — METOPROLOL SUCCINATE ER 50 MG PO TB24: 50.0000 mg | ORAL_TABLET | Freq: Every day | ORAL | 3 refills | Status: DC

## 2018-07-31 MED ORDER — HYDROCHLOROTHIAZIDE 12.5 MG PO CAPS: 12.5000 mg | ORAL_CAPSULE | Freq: Every day | ORAL | 3 refills | Status: DC

## 2018-07-31 MED ORDER — AMLODIPINE BESYLATE 5 MG PO TABS: 5.0000 mg | ORAL_TABLET | Freq: Every day | ORAL | 3 refills | Status: DC

## 2018-07-31 NOTE — Patient Instructions (Addendum)
Medication Instructions:  DECREASE amlodipine to 5 mg daily  START metoprolol succinate (Toprol XL) 50 mg daily ---take 1/2 tablet (25 mg) daily x 2 weeks  START HCTZ 12.5 mg daily  START Vascepa 2 capsules two times daily  If you need a refill on your cardiac medications before your next appointment, please call your pharmacy.   Lab work: 1 week prior to [place] [employer]) If you have labs (blood work) drawn today and your tests are completely normal, you will receive your results only by: Marland Kitchen MyChart Message (if you have MyChart) OR . A paper copy in the mail If you have any lab test that is abnormal or we need to change your treatment, we will call you to review the results.  Testing/Procedures: Your physician has requested that you have an echocardiogram. Echocardiography is a painless test that uses sound waves to create images of your heart. It provides your doctor with information about the size and shape of your heart and how well your heart's chambers and valves are working. This procedure takes approximately one hour. There are no restrictions for this procedure. This will be done at our [facility] location:  [place] has requested that you have cardiac CT. Cardiac computed tomography (CT) is a painless test that uses an x-ray machine to take clear, detailed pictures of your heart. For further information please visit [facility]. Please follow instruction sheet as given.  Follow-Up: At [facility], you and your health needs are our priority.  As part of our continuing mission to provide you with exceptional heart care, we have created designated Provider Care Teams.  These Care Teams include your primary Cardiologist (physician) and Advanced Practice Providers (APPs -  Physician Assistants and Nurse Practitioners) who all work together to provide you with the care you need, when you need it. You will need a follow up appointment in 2  months.  Please call our office 2 months in advance to schedule this appointment.  You may see Dr. Claiborne Billings or one of the following Advanced Practice Providers on your designated Care Team: [place], [place] . Fabian Sharp, PA-C

## 2018-07-31 NOTE — Progress Notes (Signed)
Cardiology Office Note    Date:  08/07/2018   ID:  Kelli Moss, DOB 1950-11-07, MRN [id number]  PCP:  Kelli Frizzle, MD  Cardiologist:  Kelli Majestic, MD    New Cardiology patient referred through the courtesy of Dr. Jenna Moss for evaluation of chest pain.  History of Present Illness:  Kelli Moss is a 67 y.o. female who is referred through the courtesy of Kelli Moss for evaluation of grabbing chest discomfort.  She is the sister of my patient Mr. Kelli Moss.  Ms. Car has a longstanding history of hypertension for at least 20 years and is also felt to have metabolic syndrome.  Has a history of hiatal hernia and celiac disease followed by Dr. Collene Moss as well as a history of pernicious anemia and peripheral neuropathy.  She also has a longstanding history of obstructive sleep apnea and has been on CPAP therapy for over 20 years.  She had recently been evaluated by Kelli Moss and during her evaluation she admitted to experiencing episodes of a grabbing-like chest discomfort which is nonexertional but may radiate to her jaws.  She admits to having some anxiety.  She also has had issues with lower extremity edema.  She is extremely sedentary.  She has additional risk factors including a brother with a history of CAD.  She has a remote history of tobacco.  She is referred by Kelli Moss for cardiology evaluation.  Past Medical History:  Diagnosis Date  . Colon polyp   . CTS (carpal tunnel syndrome)   . Depression   . Diabetes mellitus without complication (HCC)    prediabetes  . Dyslipidemia   . Gastritis   . Hypertension   . Neuromuscular disorder ([place])    peripheral neuropathy    No past surgical history on file.  Current Medications: Outpatient Medications Prior to Visit  Medication Sig Dispense Refill  . aspirin 81 MG tablet Take 81 mg by mouth daily.    . ciclopirox (PENLAC) 8 % solution Apply topically at bedtime. Apply over nail and surrounding skin.  Apply daily over previous coat. After seven (7) days, may remove with alcohol and continue cycle. 6.6 mL 5  . CINNAMON PO Take 2,000 mg by mouth daily.    . cyanocobalamin (,VITAMIN B-12,) 1000 MCG/ML injection INJECT PRN 30 mL 3  . diazepam (VALIUM) 10 MG tablet Take 1 tablet (10 mg total) by mouth every 8 (eight) hours as needed (muscle spasms). 60 tablet 2  . Efinaconazole 10 % SOLN As directed for toenail fungus 8 mL 3  . Flaxseed, Linseed, (FLAX SEEDS PO) Take 1 tablet by mouth daily.    Marland Kitchen gabapentin (NEURONTIN) 300 MG capsule Take 1 capsule (300 mg total) by mouth 3 (three) times daily. 270 capsule 3  . Grape Seed 100 MG CAPS Take by mouth.    . levofloxacin (LEVAQUIN) 500 MG tablet Take 1 tablet (500 mg total) by mouth daily. 7 tablet 0  . LORazepam (ATIVAN) 1 MG tablet Take 1 tablet (1 mg total) by mouth at bedtime as needed. 90 tablet 0  . losartan (COZAAR) 100 MG tablet TAKE 1 TABLET BY MOUTH ONCE DAILY 90 tablet 1  . Magnesium 500 MG TABS Take 500 mg by mouth.    . Misc Natural Products (BLACK CHERRY CONCENTRATE PO) Take by mouth.    Vladimir Faster Glycol-Propyl Glycol (SYSTANE OP) Apply to eye.    . sulfamethoxazole-trimethoprim (BACTRIM DS,SEPTRA DS) 800-160 MG tablet Take 1 tablet by  mouth 2 (two) times daily. 6 tablet 0  . tiZANidine (ZANAFLEX) 4 MG tablet TAKE ONE TABLET BY MOUTH EVERY 8 HOURS AS NEEDED FOR  MUSCLE  SPASMS 30 tablet 2  . TURMERIC PO Take 1,000 mg by mouth.    Marland Kitchen UNABLE TO FIND Med Name: TENS UNIT DX: M54.2; M54.9 1 Units 0  . amLODipine (NORVASC) 10 MG tablet TAKE 1 TABLET BY MOUTH ONCE DAILY 90 tablet 3  . losartan (COZAAR) 100 MG tablet TAKE 1 TABLET BY MOUTH ONCE DAILY 90 tablet 1   No facility-administered medications prior to visit.      Allergies:   Lyrica [pregabalin] and Neurontin [gabapentin]   Social History   Socioeconomic History  . Marital status: Married    Spouse name: Not on file  . Number of children: Not on file  . Years of education: Not  on file  . Highest education level: Not on file  Occupational History  . Not on file  Social Needs  . Financial resource strain: Not on file  . Food insecurity:    Worry: Not on file    Inability: Not on file  . Transportation needs:    Medical: Not on file    Non-medical: Not on file  Tobacco Use  . Smoking status: Former Smoker    Last attempt to quit: 01/23/1988    Years since quitting: 30.5  . Smokeless tobacco: Never Used  Substance and Sexual Activity  . Alcohol use: No  . Drug use: No  . Sexual activity: Not on file  Lifestyle  . Physical activity:    Days per week: Not on file    Minutes per session: Not on file  . Stress: Not on file  Relationships  . Social connections:    Talks on phone: Not on file    Gets together: Not on file    Attends religious service: Not on file    Active member of club or organization: Not on file    Attends meetings of clubs or organizations: Not on file    Relationship status: Not on file  Other Topics Concern  . Not on file  Social History Narrative  . Not on file     Family History:  The patient's family history is not on file.  Her mother died at age 22 with pancreatic cancer.  Father died at age 97 with bladder cancer.  She has a 52 year old brother who has hypertension and CAD who is my patient Kelli Moss.)  ROS General: Negative; No fevers, chills, or night sweats; obesity HEENT: Negative; No changes in vision or hearing, sinus congestion, difficulty swallowing Pulmonary: Negative; No cough, wheezing, shortness of breath, hemoptysis Cardiovascular: Negative; No chest pain, presyncope, syncope, palpitations GI: Negative; No nausea, vomiting, diarrhea, or abdominal pain GU: Negative; No dysuria, hematuria, or difficulty voiding Musculoskeletal: Negative; no myalgias, joint pain, or weakness Hematologic/Oncology: Negative; no easy bruising, bleeding Endocrine: metabolic syndrome Neuro: Negative; no changes in balance,  headaches Skin: Negative; No rashes or skin lesions Psychiatric: Negative; No behavioral problems, depression Sleep: Ructive sleep apnea, on CPAP for over 20 years; No redness of breakthrough snoring, daytime sleepiness, hypersomnolence, bruxism, restless legs, hypnogognic hallucinations, no cataplexy Other comprehensive 14 point system review is negative.   PHYSICAL EXAM:   VS:  BP (!) 152/74   Pulse 95   Ht 5' 5"  (1.651 m)   Wt 279 lb 6.4 oz (126.7 kg)   BMI 46.49 kg/m     Repeat blood  pressure by me148/76  Wt Readings from Last 3 Encounters:  07/31/18 279 lb 6.4 oz (126.7 kg)  06/12/18 277 lb (125.6 kg)  06/04/17 262 lb (118.8 kg)    General: Alert, oriented, no distress.  Skin: normal turgor, no rashes, warm and dry HEENT: Normocephalic, atraumatic. Pupils equal round and reactive to light; sclera anicteric; extraocular muscles intact; fundi without hemorrhages or exudates.  Mild arteriolar narrowing. Nose without nasal septal hypertrophy Mouth/Parynx benign; Mallinpatti scale 3 Neck: No JVD, no carotid bruits; normal carotid upstroke Lungs: clear to ausculatation and percussion; no wheezing or rales Chest wall: without tenderness to palpitation Heart: PMI not displaced, RRR, s1 s2 normal, 1/6 systolic murmur, no diastolic murmur, no rubs, gallops, thrills, or heaves Abdomen: soft, nontender; no hepatosplenomehaly, BS+; abdominal aorta nontender and not dilated by palpation. Back: no CVA tenderness Pulses 2+ Musculoskeletal: full range of motion, normal strength, no joint deformities Extremities: Tense 1+ lower extremity bilateral edema up to the thighs no clubbing cyanosis, Homan's sign negative  Neurologic: grossly nonfocal; Cranial nerves grossly wnl Psychologic: Normal mood and affect   Studies/Labs Reviewed:   EKG:  EKG is ordered today.  ECG (independently read by me): Normal sinus rhythm at 95 bpm.  Right bundle branch block with repolarization changes.  No  ectopy.  Recent Labs: BMP Latest Ref Rng & Units 06/05/2018 12/27/2016 10/03/2015  Glucose 65 - 99 mg/dL 117(H) 111(H) 99  BUN 7 - 25 mg/dL 10 12 12   Creatinine 0.50 - 0.99 mg/dL 0.55 0.65 0.58  BUN/Creat Ratio 6 - 22 (calc) NOT APPLICABLE - -  Sodium 092 - 146 mmol/L 139 140 140  Potassium 3.5 - 5.3 mmol/L 4.1 3.9 4.0  Chloride 98 - 110 mmol/L 106 104 103  CO2 20 - 32 mmol/L 22 21 25   Calcium 8.6 - 10.4 mg/dL 8.8 9.1 9.3     Hepatic Function Latest Ref Rng & Units 06/05/2018 12/27/2016 10/03/2015  Total Protein 6.1 - 8.1 g/dL 6.9 7.3 7.1  Albumin 3.6 - 5.1 g/dL - 4.1 4.3  AST 10 - 35 U/L 17 13 19   ALT 6 - 29 U/L 19 11 18   Alk Phosphatase 33 - 130 U/L - 66 62  Total Bilirubin 0.2 - 1.2 mg/dL 0.4 0.5 0.5    CBC Latest Ref Rng & Units 06/05/2018 12/27/2016 10/03/2015  WBC 3.8 - 10.8 Thousand/uL 8.0 9.6 6.7  Hemoglobin 11.7 - 15.5 g/dL 14.0 13.1 13.5  Hematocrit 35.0 - 45.0 % 42.3 39.4 40.1  Platelets 140 - 400 Thousand/uL 242 215 208   Lab Results  Component Value Date   MCV 83.3 06/05/2018   MCV 85.5 12/27/2016   MCV 83.9 10/03/2015   Lab Results  Component Value Date   TSH 3.20 12/27/2016   Lab Results  Component Value Date   HGBA1C 5.2 12/27/2016     BNP No results found for: BNP  ProBNP No results found for: PROBNP   Lipid Panel     Component Value Date/Time   CHOL 142 06/05/2018 0822   TRIG 350 (H) 06/05/2018 0822   HDL 31 (L) 06/05/2018 0822   CHOLHDL 4.6 06/05/2018 0822   VLDL 48 (H) 12/27/2016 0809   LDLCALC 69 06/05/2018 0822     RADIOLOGY: No results found.   Additional studies/ records that were reviewed today include:  I reviewed the records of Kelli Moss as well as her recent laboratory.   ASSESSMENT:    1. Chest pain, moderate coronary artery risk   2.  Essential hypertension   3. Peripheral edema   4. OSA (obstructive sleep apnea)   5. Mixed hyperlipidemia   6. Metabolic syndrome   7. Pre-procedure lab exam   8. Morbid obesity  [facility])     PLAN:  Ms. Afnan Cadiente is a 66 year old female patient who is followed by Kelli Moss and has a history of hypertension for over 20 years in addition to obstructive sleep apnea on CPAP therapy.  She has a longstanding history of morbid obesity and has metabolic syndrome.  She has asked parents epigastric discomfort and has been diagnosed with hiatal hernia and celiac disease for which she is followed by Dr. Collene Moss.  She has a history of pernicious anemia as well as peripheral neuropathy.  She has experienced episodes of gripping chest discomfort which radiated to her jaw.  Her chest pain is atypical and that it is not associated with activity.  However, she is very sedentary and is morbidly obese and does admit to experiencing exertional dyspnea.  She also admits to having periods of significant anxiety.  On exam she has significant tense lower extremity edema to her thighs.  She has been on amlodipine 10 mg and losartan 100 mg for hypertension.  I am recommending she decrease amlodipine to 5 mg.  Her resting pulse is 95.  I am adding beta-blocker therapy and will initiate Toprol-XL 25 mg for 2 weeks and she will then titrate this to 50 mg.  I am also adding HCTZ 12.5 mg to help with her edema in addition to the reduction in amlodipine dosing.  I am scheduling her for 2D echo Doppler study to assess systolic and diastolic function particularly with her exertional dyspnea.  With her significant family history for CAD I am also recommending that she undergo a CT coronary angios to assess for subclinical atherosclerosis.  She has significant mixed hyperlipidemia and laboratory in October 2019 showed triglycerides of 350, a very low HDL of 31.  If advanced particle testing were to be performed most likely she would have significant Lee increased small dense LDL sub-particles.  I am adding Vascepa 2 capsules twice a day to her medical regimen.  We discussed the importance of weight loss.  She continues to  use CPAP with 100% compliance.  We will see her back in the office in 2 months for follow-up evaluation.   Medication Adjustments/Labs and Tests Ordered: Current medicines are reviewed at length with the patient today.  Concerns regarding medicines are outlined above.  Medication changes, Labs and Tests ordered today are listed in the Patient Instructions below. Patient Instructions  Medication Instructions:  DECREASE amlodipine to 5 mg daily  START metoprolol succinate (Toprol XL) 50 mg daily ---take 1/2 tablet (25 mg) daily x 2 weeks  START HCTZ 12.5 mg daily  START Vascepa 2 capsules two times daily  If you need a refill on your cardiac medications before your next appointment, please call your pharmacy.   Lab work: 1 week prior to CT [employer]) If you have labs (blood work) drawn today and your tests are completely normal, you will receive your results only by: Marland Kitchen MyChart Message (if you have MyChart) OR . A paper copy in the mail If you have any lab test that is abnormal or we need to change your treatment, we will call you to review the results.  Testing/Procedures: Your physician has requested that you have an echocardiogram. Echocardiography is a painless test that uses sound waves to create images of your  heart. It provides your doctor with information about the size and shape of your heart and how well your heart's chambers and valves are working. This procedure takes approximately one hour. There are no restrictions for this procedure. This will be done at our [facility] location:  Arabi has requested that you have cardiac CT. Cardiac computed tomography (CT) is a painless test that uses an x-ray machine to take clear, detailed pictures of your heart. For further information please visit [email or web address]. Please follow instruction sheet as given.  Follow-Up: At [facility], you and your health needs are our priority.  As part of  our continuing mission to provide you with exceptional heart care, we have created designated Provider Care Teams.  These Care Teams include your primary Cardiologist (physician) and Advanced Practice Providers (APPs -  Physician Assistants and Nurse Practitioners) who all work together to provide you with the care you need, when you need it. You will need a follow up appointment in 2 months.  Please call our office 2 months in advance to schedule this appointment.  You may see Dr. Claiborne Billings or one of the following Advanced Practice Providers on your designated Care Team: [place], [place] . Fabian Sharp, PA-C       Signed, Kelli Majestic, MD  08/07/2018 8:02 AM    [facility] [address] [place], [place], [place]  [address] Phone: [phone]

## 2018-08-07 ENCOUNTER — Other Ambulatory Visit: Payer: Self-pay

## 2018-08-07 ENCOUNTER — Ambulatory Visit (HOSPITAL_COMMUNITY): Payer: Medicare Other | Attending: Cardiology

## 2018-08-07 ENCOUNTER — Encounter: Payer: Self-pay | Admitting: Cardiovascular Disease

## 2018-08-07 DIAGNOSIS — I10 Essential (primary) hypertension: Secondary | ICD-10-CM | POA: Insufficient documentation

## 2018-08-07 DIAGNOSIS — R609 Edema, unspecified: Secondary | ICD-10-CM | POA: Diagnosis not present

## 2018-08-07 DIAGNOSIS — R079 Chest pain, unspecified: Secondary | ICD-10-CM | POA: Diagnosis not present

## 2018-08-07 MED ORDER — PERFLUTREN LIPID MICROSPHERE
1.0000 mL | INTRAVENOUS | Status: AC | PRN
  Administered 2018-08-07: 2 mL via INTRAVENOUS

## 2018-08-25 ENCOUNTER — Ambulatory Visit: Payer: Medicare Other | Admitting: Interventional Cardiology

## 2018-08-25 DIAGNOSIS — Z01812 Encounter for preprocedural laboratory examination: Secondary | ICD-10-CM | POA: Diagnosis not present

## 2018-08-25 DIAGNOSIS — R079 Chest pain, unspecified: Secondary | ICD-10-CM | POA: Diagnosis not present

## 2018-08-26 LAB — BASIC METABOLIC PANEL
BUN/Creatinine Ratio: 21 (ref 12–28)
BUN: 13 mg/dL (ref 8–27)
CO2: 22 mmol/L (ref 20–29)
Calcium: 8.9 mg/dL (ref 8.7–10.3)
Chloride: 103 mmol/L (ref 96–106)
Creatinine, Ser: 0.61 mg/dL (ref 0.57–1.00)
GFR calc Af Amer: 108 mL/min/{1.73_m2} (ref >59)
GFR calc non Af Amer: 94 mL/min/{1.73_m2} (ref >59)
Glucose: 110 mg/dL — ABNORMAL HIGH (ref 65–99)
Potassium: 3.9 mmol/L (ref 3.5–5.2)
Sodium: 141 mmol/L (ref 134–144)

## 2018-09-04 ENCOUNTER — Telehealth (HOSPITAL_COMMUNITY): Payer: Self-pay | Admitting: Emergency Medicine

## 2018-09-04 NOTE — Telephone Encounter (Signed)
Reaching out to patient to offer assistance regarding upcoming cardiac imaging study; pt verbalizes understanding of appt date/time, parking situation and where to check in, pre-test NPO status and medications ordered, and verified current allergies; name and call back number provided for further questions should they arise Shakelia Scrivner RN Navigator Cardiac Imaging [phone] 

## 2018-09-05 ENCOUNTER — Ambulatory Visit (HOSPITAL_COMMUNITY)
Admission: RE | Admit: 2018-09-05 | Discharge: 2018-09-05 | Disposition: A | Discharge status: Home / Self Care | Payer: Medicare Other | Source: Ambulatory Visit | Attending: Cardiovascular Disease | Admitting: Cardiovascular Disease

## 2018-09-05 ENCOUNTER — Ambulatory Visit (HOSPITAL_COMMUNITY): Admission: RE | Admit: 2018-09-05 | Payer: Medicare Other | Source: Ambulatory Visit

## 2018-09-05 DIAGNOSIS — I2510 Atherosclerotic heart disease of native coronary artery without angina pectoris: Secondary | ICD-10-CM | POA: Diagnosis not present

## 2018-09-05 DIAGNOSIS — R079 Chest pain, unspecified: Secondary | ICD-10-CM

## 2018-09-05 DIAGNOSIS — R911 Solitary pulmonary nodule: Secondary | ICD-10-CM | POA: Insufficient documentation

## 2018-09-05 DIAGNOSIS — I313 Pericardial effusion (noninflammatory): Secondary | ICD-10-CM | POA: Diagnosis not present

## 2018-09-05 MED ORDER — METOPROLOL TARTRATE 5 MG/5ML IV SOLN
5.0000 mg | INTRAVENOUS | Status: DC | PRN
  Administered 2018-09-05: 5 mg via INTRAVENOUS
  Filled 2018-09-05: qty 5

## 2018-09-05 MED ORDER — IOPAMIDOL (ISOVUE-370) INJECTION 76%
80.0000 mL | Freq: Once | INTRAVENOUS | Status: AC | PRN
  Administered 2018-09-05: 80 mL via INTRAVENOUS

## 2018-09-05 MED ORDER — NITROGLYCERIN 0.4 MG SL SUBL
SUBLINGUAL_TABLET | SUBLINGUAL | Status: AC
  Filled 2018-09-05: qty 2

## 2018-09-05 MED ORDER — NITROGLYCERIN 0.4 MG SL SUBL
0.8000 mg | SUBLINGUAL_TABLET | SUBLINGUAL | Status: DC | PRN
  Administered 2018-09-05: 0.8 mg via SUBLINGUAL
  Filled 2018-09-05: qty 25

## 2018-09-05 MED ORDER — METOPROLOL TARTRATE 5 MG/5ML IV SOLN
INTRAVENOUS | Status: AC
  Filled 2018-09-05: qty 20

## 2018-09-29 ENCOUNTER — Other Ambulatory Visit: Payer: Self-pay | Admitting: Family Medicine

## 2018-09-29 NOTE — Telephone Encounter (Signed)
Ok to refill??  Last office visit 06/12/2018.  Last refill 05/16/2016, #2 refills.

## 2018-09-30 ENCOUNTER — Other Ambulatory Visit: Payer: Self-pay | Admitting: Family Medicine

## 2018-09-30 DIAGNOSIS — R918 Other nonspecific abnormal finding of lung field: Secondary | ICD-10-CM

## 2018-09-30 MED ORDER — ROSUVASTATIN CALCIUM 20 MG PO TABS: 20.0000 mg | ORAL_TABLET | Freq: Every day | ORAL | 3 refills | Status: DC

## 2018-11-07 ENCOUNTER — Telehealth: Payer: Self-pay

## 2018-11-07 ENCOUNTER — Other Ambulatory Visit: Payer: Self-pay

## 2018-11-07 DIAGNOSIS — R911 Solitary pulmonary nodule: Secondary | ICD-10-CM

## 2018-11-07 NOTE — Telephone Encounter (Signed)
   Cardiac Questionnaire:    Since your last visit or hospitalization:    1. Have you been having new or worsening chest pain? NO   2. Have you been having new or worsening shortness of breath? NO 3. Have you been having new or worsening leg swelling, wt gain, or increase in abdominal girth (pants fitting more tightly)? NO   4. Have you had any passing out spells? NO      Recommend to follow up outside of 6 weeks. Original date of appointment: 11/11/2018   .

## 2018-11-10 ENCOUNTER — Ambulatory Visit: Payer: Medicare Other | Admitting: Cardiovascular Disease

## 2018-11-11 ENCOUNTER — Ambulatory Visit: Payer: Medicare Other | Admitting: Cardiovascular Disease

## 2018-12-10 ENCOUNTER — Telehealth: Payer: Self-pay | Admitting: Cardiovascular Disease

## 2018-12-10 NOTE — Telephone Encounter (Signed)
Spoke with pt, she is needing a repeat CT scan to follow up a nodule in July and then a follow up with dr Claiborne Billings after the scan. They had called her today regarding a follow up appointment but she had talked to dr Claiborne Billings at her husband and he told her to follow up after the scan. Will forward to julie to get things scheduled for her.

## 2018-12-10 NOTE — Telephone Encounter (Signed)
New Message:    Patient calling concerning a appt she needs to have a test done before she see the doctor. Please call patient concering this mater.

## 2018-12-10 NOTE — Telephone Encounter (Signed)
LVM for patient to schedule 2 month followup with Dr. Claiborne Billings.

## 2018-12-10 NOTE — Telephone Encounter (Signed)
Called and advised with patient that she is correct, she is to have Chest CT first, and then come and see Dr.Kelly after.  Patient Chest CT was ordered, not scheduled yet.  Patient aware to call once scheduled- to get follow up appointment.  No other questions at this time.

## 2018-12-30 ENCOUNTER — Other Ambulatory Visit: Payer: Self-pay | Admitting: Family Medicine

## 2018-12-31 IMAGING — MG DIGITAL SCREENING BILATERAL MAMMOGRAM WITH TOMO AND CAD
6 of 12 series · 6 of 36 positions shown · non-contrast
Comparison: Previous exam(s).

CLINICAL DATA: Screening.

EXAM:
DIGITAL SCREENING BILATERAL MAMMOGRAM WITH TOMO AND CAD

[L MLO synth-2D (1 of 2)]
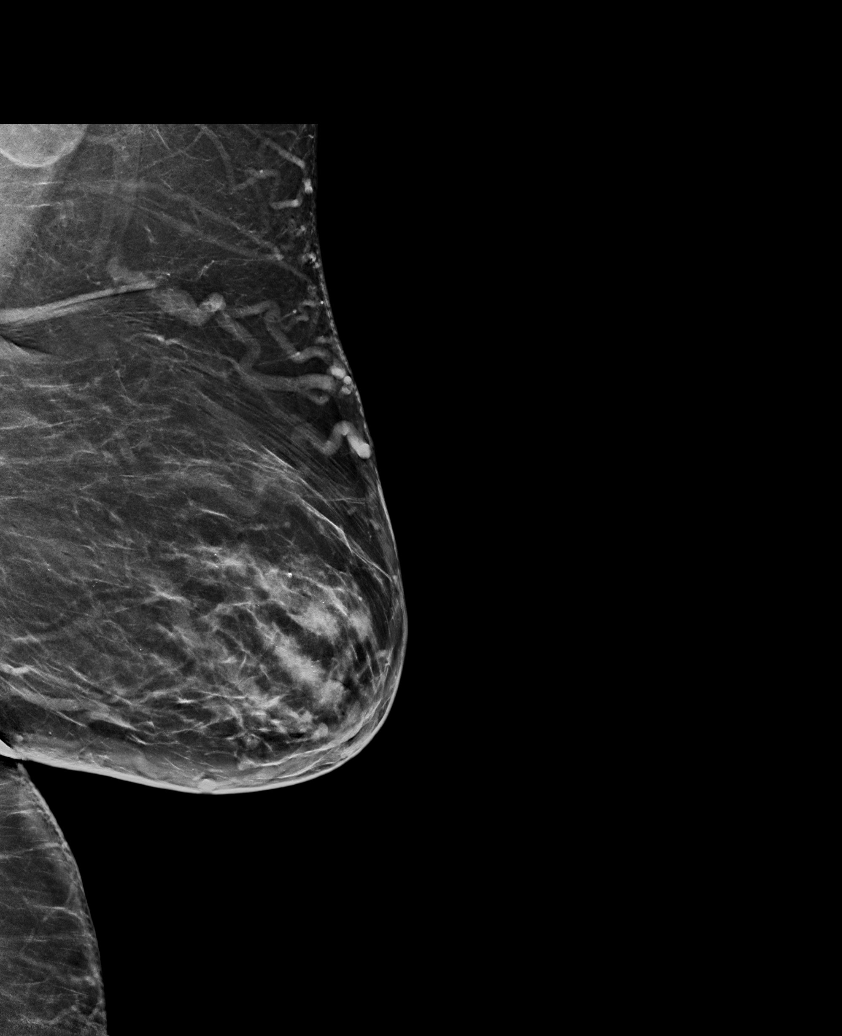

[R CC synth-2D]
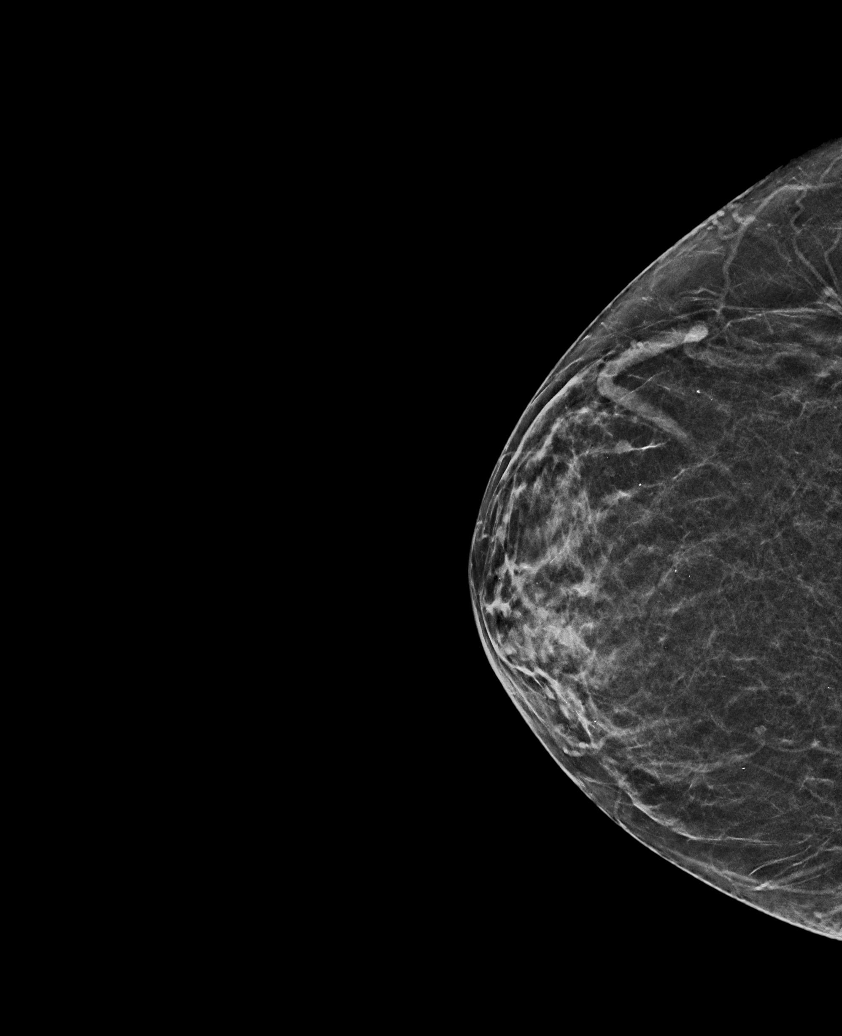

[L CC synth-2D]
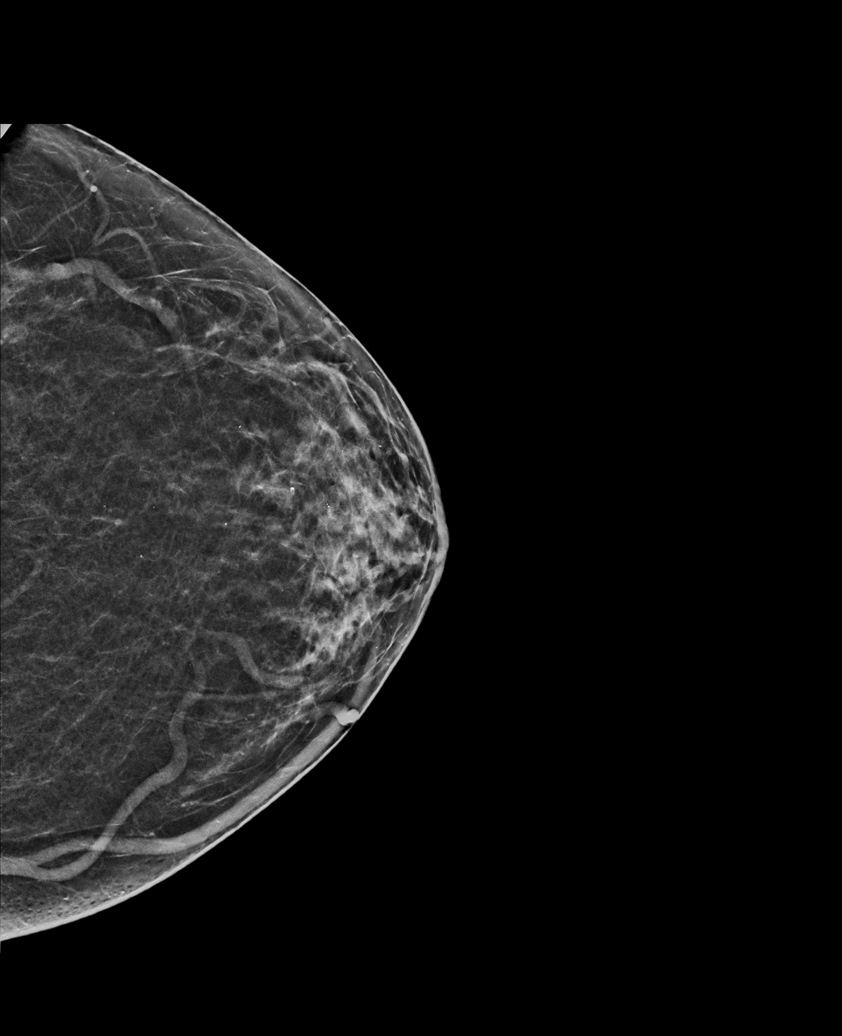

[R MLO synth-2D (1 of 2)]
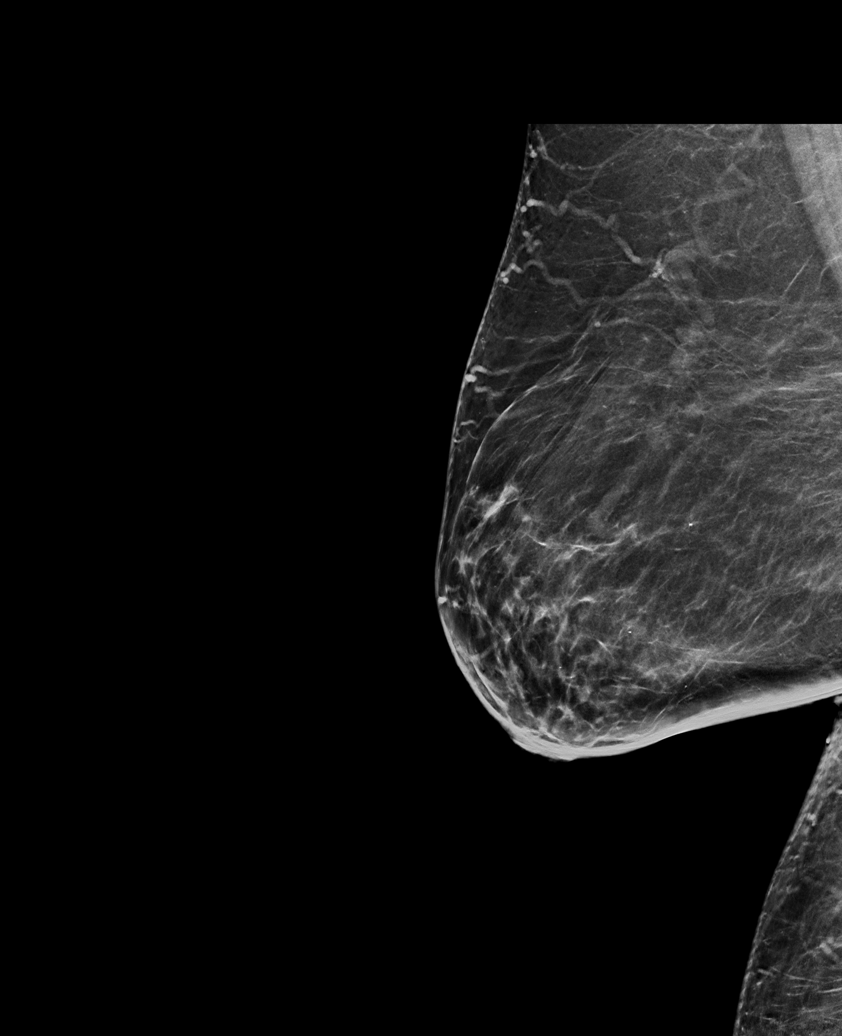

[L MLO synth-2D (2 of 2)]
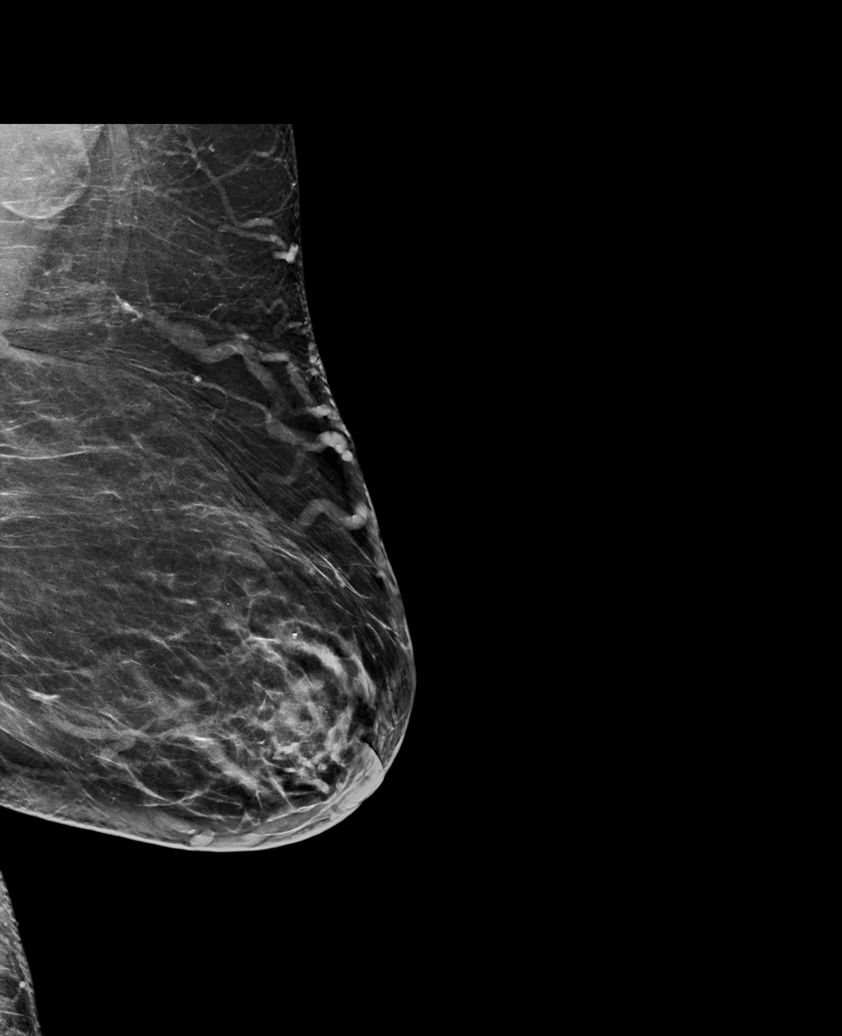

[R MLO synth-2D (2 of 2)]
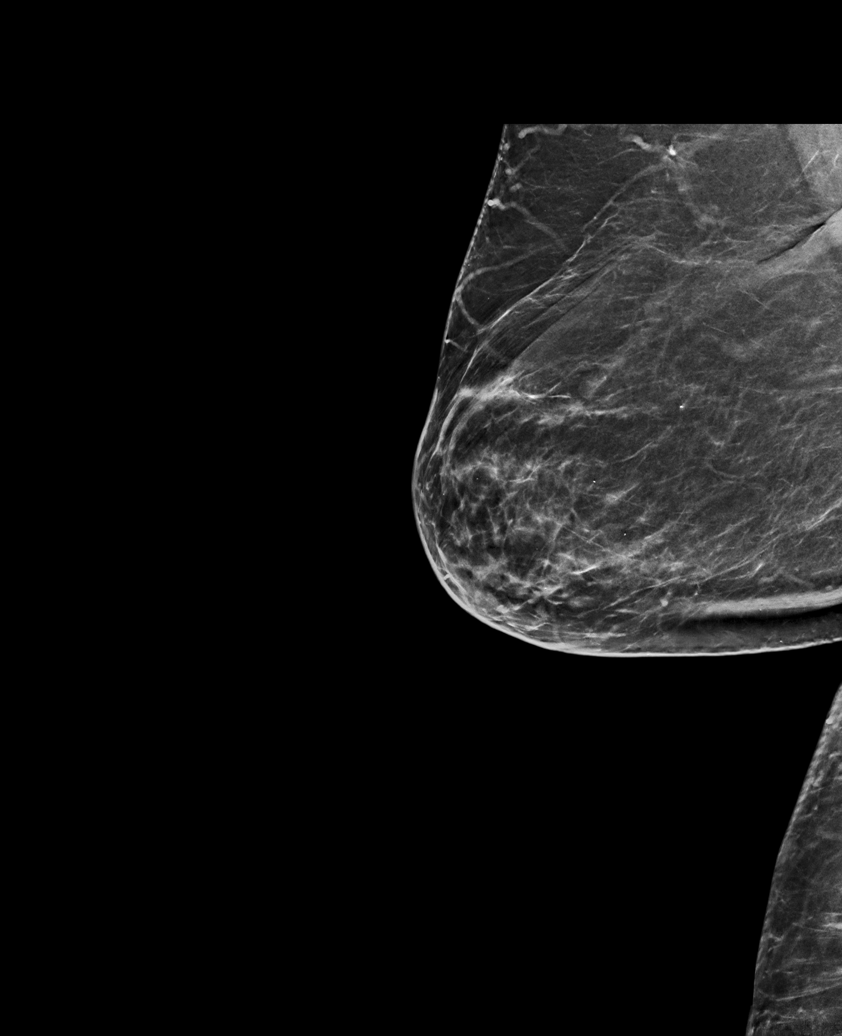

[6 of 36 positions shown; findings below may reference images not displayed]

ACR Breast Density Category b: There are scattered areas of
fibroglandular density.
FINDINGS: There are no findings suspicious for malignancy. Images were
processed with CAD.
IMPRESSION: No mammographic evidence of malignancy. A result letter of this
screening mammogram will be mailed directly to the patient.

RECOMMENDATION:
Screening mammogram in one year. (Code:[id number])

BI-RADS CATEGORY  1: Negative.

## 2019-02-19 ENCOUNTER — Other Ambulatory Visit: Payer: Self-pay | Admitting: Family Medicine

## 2019-02-19 DIAGNOSIS — I10 Essential (primary) hypertension: Secondary | ICD-10-CM

## 2019-02-26 ENCOUNTER — Ambulatory Visit (INDEPENDENT_AMBULATORY_CARE_PROVIDER_SITE_OTHER): Payer: Medicare Other | Admitting: Family Medicine

## 2019-02-26 ENCOUNTER — Encounter: Payer: Self-pay | Admitting: Family Medicine

## 2019-02-26 ENCOUNTER — Other Ambulatory Visit: Payer: Self-pay

## 2019-02-26 VITALS — BP 160/56 | HR 98 | Temp 98.5°F | Resp 18 | Ht 67.0 in | Wt 284.0 lb

## 2019-02-26 DIAGNOSIS — R911 Solitary pulmonary nodule: Secondary | ICD-10-CM

## 2019-02-26 DIAGNOSIS — R102 Pelvic and perineal pain: Secondary | ICD-10-CM | POA: Diagnosis not present

## 2019-02-26 DIAGNOSIS — I10 Essential (primary) hypertension: Secondary | ICD-10-CM | POA: Diagnosis not present

## 2019-02-26 DIAGNOSIS — M79644 Pain in right finger(s): Secondary | ICD-10-CM | POA: Diagnosis not present

## 2019-02-26 DIAGNOSIS — G8929 Other chronic pain: Secondary | ICD-10-CM

## 2019-02-26 LAB — MICROSCOPIC MESSAGE

## 2019-02-26 LAB — URINALYSIS, ROUTINE W REFLEX MICROSCOPIC
Bilirubin Urine: NEGATIVE
Glucose, UA: NEGATIVE
Hyaline Cast: NONE SEEN /LPF
Ketones, ur: NEGATIVE
Nitrite: NEGATIVE
Protein, ur: NEGATIVE
Specific Gravity, Urine: 1.02 (ref 1.001–1.03)
pH: 6 (ref 5.0–8.0)

## 2019-02-26 NOTE — Progress Notes (Signed)
Subjective:    Patient ID: Kelli Moss, female    DOB: 12-Dec-1950, 68 y.o.   MRN: [id number]  HPI  Patient was seen by her cardiologist in January of this year and had a CT scan of the coronary arteries.  There was a coincidental finding of a 6 mm pulmonary nodule.  The report is dictated below.  IMPRESSION: 6 mm right middle lobe nodule. This was not definitively present on prior abdominal CT from 2017. Non-contrast chest CT at 6-12 months is recommended. If the nodule is stable at time of repeat CT, then future CT at 18-24 months (from today's scan) is considered optional for low-risk patients, but is recommended for high-risk patients.  Patient has a history of smoking in the past but quit more than 20 years ago.  She is scheduled for repeat CT scan 7/13 to monitor for progression of the right pulmonary nodule.  However she has several other concerns.  First she has been having pain in her right first MCP joint.  What she describes sounds like a trigger finger.  For several weeks whenever she would try to bend her PIP and MCP joint, her finger would lock at the MCP joint and she would have to forcibly extend it.  However she then accidentally jammed her thumb against a hard object and it was forcibly flexed at the PIP and MCP joint.  Ever since that time the patient is unable to bend the PIP joint.  She can slightly bend the MCP joint but she has pain located at the MCP joint.  Some of her symptoms sound like a trigger finger other sound like arthritis.  She has not had any imaging performed.  Second issue her headaches.  She has a history of severe cervical degenerative disc disease with muscle spasms along the cervical spine which triggers muscle tension headaches.  She has been using tizanidine for this as needed.  Third concern is pelvic discomfort.  She thinks she may have a urinary tract infection.  However she denies dysuria.  She denies frequency.  She denies urgency.  Instead she  reports count of a bloating abdominal discomfort.  She is uncertain if this is her urine causing the symptoms or if it could be her celiac disease and indigestion.  Her urinalysis does shows trace blood and trace leukocyte esterase however it is not significant for a urinary tract infection based on her symptomatology. Past Medical History:  Diagnosis Date  . Colon polyp   . CTS (carpal tunnel syndrome)   . Depression   . Diabetes mellitus without complication (HCC)    prediabetes  . Dyslipidemia   . Gastritis   . Hypertension   . Neuromuscular disorder ([place])    peripheral neuropathy   No past surgical history on file. Current Outpatient Medications on File Prior to Visit  Medication Sig Dispense Refill  . amLODipine (NORVASC) 5 MG tablet Take 1 tablet (5 mg total) by mouth daily. 90 tablet 3  . aspirin 81 MG tablet Take 81 mg by mouth daily.    . ciclopirox (PENLAC) 8 % solution Apply topically at bedtime. Apply over nail and surrounding skin. Apply daily over previous coat. After seven (7) days, may remove with alcohol and continue cycle. 6.6 mL 5  . CINNAMON PO Take 2,000 mg by mouth daily.    . cyanocobalamin (,VITAMIN B-12,) 1000 MCG/ML injection INJECT PRN 30 mL 3  . diazepam (VALIUM) 10 MG tablet TAKE 1 TABLET BY MOUTH EVERY  8 HOURS AS NEEDED FOR MUSCLE SPASM 60 tablet 0  . Efinaconazole 10 % SOLN As directed for toenail fungus 8 mL 3  . Flaxseed, Linseed, (FLAX SEEDS PO) Take 1 tablet by mouth daily.    Marland Kitchen gabapentin (NEURONTIN) 300 MG capsule Take 1 capsule (300 mg total) by mouth 3 (three) times daily. (Patient not taking: Reported on 09/05/2018) 270 capsule 3  . Grape Seed 100 MG CAPS Take by mouth.    . hydrochlorothiazide (MICROZIDE) 12.5 MG capsule Take 1 capsule (12.5 mg total) by mouth daily. (Patient not taking: Reported on 09/05/2018) 90 capsule 3  . Icosapent Ethyl (VASCEPA) 1 g CAPS Take 2 capsules (2 g total) by mouth 2 (two) times daily. 360 capsule 3  . levofloxacin  (LEVAQUIN) 500 MG tablet Take 1 tablet (500 mg total) by mouth daily. (Patient not taking: Reported on 09/05/2018) 7 tablet 0  . LORazepam (ATIVAN) 1 MG tablet Take 1 tablet (1 mg total) by mouth at bedtime as needed. 90 tablet 0  . losartan (COZAAR) 100 MG tablet Take 1 tablet by mouth once daily 90 tablet 3  . Magnesium 500 MG TABS Take 500 mg by mouth.    . metoprolol succinate (TOPROL-XL) 50 MG 24 hr tablet Take 1 tablet (50 mg total) by mouth daily. Take with or immediately following a meal. 90 tablet 3  . Misc Natural Products (BLACK CHERRY CONCENTRATE PO) Take by mouth.    Vladimir Faster Glycol-Propyl Glycol (SYSTANE OP) Apply to eye.    . rosuvastatin (CRESTOR) 20 MG tablet Take 1 tablet (20 mg total) by mouth daily. 90 tablet 3  . sulfamethoxazole-trimethoprim (BACTRIM DS,SEPTRA DS) 800-160 MG tablet Take 1 tablet by mouth 2 (two) times daily. (Patient not taking: Reported on 09/05/2018) 6 tablet 0  . tiZANidine (ZANAFLEX) 4 MG tablet TAKE ONE TABLET BY MOUTH EVERY 8 HOURS AS NEEDED FOR  MUSCLE  SPASMS 30 tablet 2  . TURMERIC PO Take 1,000 mg by mouth.    Marland Kitchen UNABLE TO FIND Med Name: TENS UNIT DX: M54.2; M54.9 1 Units 0   No current facility-administered medications on file prior to visit.    Allergies  Allergen Reactions  . Lyrica [Pregabalin]     nightmares  . Neurontin [Gabapentin]     Very whoozy   Social History   Socioeconomic History  . Marital status: Married    Spouse name: Not on file  . Number of children: Not on file  . Years of education: Not on file  . Highest education level: Not on file  Occupational History  . Not on file  Social Needs  . Financial resource strain: Not on file  . Food insecurity    Worry: Not on file    Inability: Not on file  . Transportation needs    Medical: Not on file    Non-medical: Not on file  Tobacco Use  . Smoking status: Former Smoker    Quit date: 01/23/1988    Years since quitting: 31.1  . Smokeless tobacco: Never Used   Substance and Sexual Activity  . Alcohol use: No  . Drug use: No  . Sexual activity: Not on file  Lifestyle  . Physical activity    Days per week: Not on file    Minutes per session: Not on file  . Stress: Not on file  Relationships  . Social Herbalist on phone: Not on file    Gets together: Not on file  Attends religious service: Not on file    Active member of club or organization: Not on file    Attends meetings of clubs or organizations: Not on file    Relationship status: Not on file  . Intimate partner violence    Fear of current or ex partner: Not on file    Emotionally abused: Not on file    Physically abused: Not on file    Forced sexual activity: Not on file  Other Topics Concern  . Not on file  Social History Narrative  . Not on file     Review of Systems  All other systems reviewed and are negative.      Objective:   Physical Exam  Constitutional: She appears well-developed and well-nourished. No distress.  Cardiovascular: Normal rate, regular rhythm and normal heart sounds.  No murmur heard. Pulmonary/Chest: Effort normal. No respiratory distress. She has no wheezes. She has no rales. She exhibits no tenderness.  Abdominal: Soft. Bowel sounds are normal. She exhibits no distension. There is no abdominal tenderness. There is no rebound and no guarding.  Musculoskeletal:     Cervical back: She exhibits decreased range of motion, tenderness and pain.     Right hand: She exhibits decreased range of motion, tenderness and bony tenderness. Decreased sensation noted. Decreased sensation is present in the medial distribution. Normal strength noted.       Hands:  Skin: She is not diaphoretic.  Nursing note and vitals reviewed.         Assessment & Plan:  1. Pulmonary nodule, right Await the results of the CT scan.  If the nodule is stable no further work-up is necessary - COMPLETE METABOLIC PANEL WITH GFR  2. Chronic pain of right thumb  Obtain x-ray of the right thumb.  If no fracture is seen, I will try cortisone injection at the base of the right first MCP joint on the volar surface to treat a possible trigger finger - DG Hand Complete Right; Future  3. Pelvic pain Urinalysis is unremarkable.  I believe this is more likely IBS/celiac - Urinalysis, Routine w reflex microscopic  4. Benign essential HTN Blood pressure is extremely high today.  I recommended increasing amlodipine to 10 mg a day and rechecking blood pressure in 2 weeks

## 2019-02-27 ENCOUNTER — Other Ambulatory Visit: Payer: Medicare Other

## 2019-02-27 ENCOUNTER — Encounter (HOSPITAL_COMMUNITY): Payer: Self-pay

## 2019-02-27 ENCOUNTER — Other Ambulatory Visit: Payer: Self-pay

## 2019-02-27 ENCOUNTER — Ambulatory Visit (HOSPITAL_COMMUNITY)
Admission: RE | Admit: 2019-02-27 | Discharge: 2019-02-27 | Disposition: A | Discharge status: Home / Self Care | Payer: Medicare Other | Source: Ambulatory Visit | Attending: Family Medicine | Admitting: Family Medicine

## 2019-02-27 DIAGNOSIS — G8929 Other chronic pain: Secondary | ICD-10-CM | POA: Diagnosis not present

## 2019-02-27 DIAGNOSIS — R6889 Other general symptoms and signs: Secondary | ICD-10-CM | POA: Diagnosis not present

## 2019-02-27 DIAGNOSIS — M79641 Pain in right hand: Secondary | ICD-10-CM | POA: Diagnosis not present

## 2019-02-27 DIAGNOSIS — M79644 Pain in right finger(s): Secondary | ICD-10-CM | POA: Diagnosis not present

## 2019-02-27 DIAGNOSIS — S6991XA Unspecified injury of right wrist, hand and finger(s), initial encounter: Secondary | ICD-10-CM | POA: Diagnosis not present

## 2019-02-27 DIAGNOSIS — Z20822 Contact with and (suspected) exposure to covid-19: Secondary | ICD-10-CM

## 2019-02-27 LAB — COMPLETE METABOLIC PANEL WITH GFR
AG Ratio: 1.6 (calc) (ref 1.0–2.5)
ALT: 20 U/L (ref 6–29)
AST: 17 U/L (ref 10–35)
Albumin: 4.2 g/dL (ref 3.6–5.1)
Alkaline phosphatase (APISO): 61 U/L (ref 37–153)
BUN: 15 mg/dL (ref 7–25)
CO2: 24 mmol/L (ref 20–32)
Calcium: 9 mg/dL (ref 8.6–10.4)
Chloride: 108 mmol/L (ref 98–110)
Creat: 0.59 mg/dL (ref 0.50–0.99)
GFR, Est African American: 109 mL/min/{1.73_m2} (ref >=60)
GFR, Est Non African American: 94 mL/min/{1.73_m2} (ref >=60)
Globulin: 2.7 g/dL (calc) (ref 1.9–3.7)
Glucose, Bld: 118 mg/dL — ABNORMAL HIGH (ref 65–99)
Potassium: 3.9 mmol/L (ref 3.5–5.3)
Sodium: 142 mmol/L (ref 135–146)
Total Bilirubin: 0.5 mg/dL (ref 0.2–1.2)
Total Protein: 6.9 g/dL (ref 6.1–8.1)

## 2019-03-02 ENCOUNTER — Ambulatory Visit
Admission: RE | Admit: 2019-03-02 | Discharge: 2019-03-02 | Disposition: A | Discharge status: Home / Self Care | Payer: Medicare Other | Source: Ambulatory Visit | Attending: Family Medicine | Admitting: Family Medicine

## 2019-03-02 ENCOUNTER — Other Ambulatory Visit: Payer: Self-pay

## 2019-03-02 ENCOUNTER — Other Ambulatory Visit: Payer: Self-pay | Admitting: Family Medicine

## 2019-03-02 DIAGNOSIS — R918 Other nonspecific abnormal finding of lung field: Secondary | ICD-10-CM | POA: Diagnosis not present

## 2019-03-05 ENCOUNTER — Encounter: Payer: Self-pay | Admitting: Family Medicine

## 2019-03-05 ENCOUNTER — Ambulatory Visit (INDEPENDENT_AMBULATORY_CARE_PROVIDER_SITE_OTHER): Payer: Medicare Other | Admitting: Family Medicine

## 2019-03-05 ENCOUNTER — Other Ambulatory Visit: Payer: Self-pay

## 2019-03-05 VITALS — BP 152/68 | HR 98 | Temp 98.9°F | Resp 16 | Ht 67.0 in | Wt 284.0 lb

## 2019-03-05 DIAGNOSIS — M65311 Trigger thumb, right thumb: Secondary | ICD-10-CM | POA: Diagnosis not present

## 2019-03-05 LAB — NOVEL CORONAVIRUS, NAA: SARS-CoV-2, NAA: NOT DETECTED

## 2019-03-05 MED ORDER — TIZANIDINE HCL 4 MG PO TABS: ORAL_TABLET | ORAL | 2 refills | Status: DC

## 2019-03-05 NOTE — Progress Notes (Signed)
Subjective:    Patient ID: Kelli Moss, female    DOB: [birth year], 68 y.o.   MRN: [id number]  HPI  Patient was seen by her cardiologist in January of this year and had a CT scan of the coronary arteries.  There was a coincidental finding of a 6 mm pulmonary nodule.  The report is dictated below.  IMPRESSION: 6 mm right middle lobe nodule. This was not definitively present on prior abdominal CT from 2017. Non-contrast chest CT at 6-12 months is recommended. If the nodule is stable at time of repeat CT, then future CT at 18-24 months (from today's scan) is considered optional for low-risk patients, but is recommended for high-risk patients.  Patient has a history of smoking in the past but quit more than 20 years ago.  She is scheduled for repeat CT scan 7/13 to monitor for progression of the right pulmonary nodule.  However she has several other concerns.  First she has been having pain in her right first MCP joint.  What she describes sounds like a trigger finger.  For several weeks whenever she would try to bend her PIP and MCP joint, her finger would lock at the MCP joint and she would have to forcibly extend it.  However she then accidentally jammed her thumb against a hard object and it was forcibly flexed at the PIP and MCP joint.  Ever since that time the patient is unable to bend the PIP joint.  She can slightly bend the MCP joint but she has pain located at the MCP joint.  Some of her symptoms sound like a trigger finger other sound like arthritis.  She has not had any imaging performed.  Second issue her headaches.  She has a history of severe cervical degenerative disc disease with muscle spasms along the cervical spine which triggers muscle tension headaches.  She has been using tizanidine for this as needed.  Third concern is pelvic discomfort.  She thinks she may have a urinary tract infection.  However she denies dysuria.  She denies frequency.  She denies urgency.  Instead she  reports count of a bloating abdominal discomfort.  She is uncertain if this is her urine causing the symptoms or if it could be her celiac disease and indigestion.  Her urinalysis does shows trace blood and trace leukocyte esterase however it is not significant for a urinary tract infection based on her symptomatology.  At that time, my plan was: 1. Pulmonary nodule, right Await the results of the CT scan.  If the nodule is stable no further work-up is necessary - COMPLETE METABOLIC PANEL WITH GFR  2. Chronic pain of right thumb Obtain x-ray of the right thumb.  If no fracture is seen, I will try cortisone injection at the base of the right first MCP joint on the volar surface to treat a possible trigger finger - DG Hand Complete Right; Future  3. Pelvic pain Urinalysis is unremarkable.  I believe this is more likely IBS/celiac - Urinalysis, Routine w reflex microscopic  4. Benign essential HTN Blood pressure is extremely high today.  I recommended increasing amlodipine to 10 mg a day and rechecking blood pressure in 2 weeks   03/05/19 Fortunately, the CT scan showed a stable pulmonary nodule at 6 mm.  They recommended no follow-up for a low risk patient and 1 year follow-up for high risk patient.  The patient would like to repeat the CT scan in 1 year.  Her blood pressure  still slightly elevated today however she is nervous about getting a cortisone shot in her hand and is only been 1 week since we increase the amlodipine.  She continues to complain of pain in her right MCP joint, #1.  She also reports locking in the PIP joint whenever she tries to make a fist.  I believe she has a trigger finger in the thumb. Past Medical History:  Diagnosis Date   Colon polyp    CTS (carpal tunnel syndrome)    Depression    Diabetes mellitus without complication ([place])    prediabetes   Dyslipidemia    Gastritis    Hypertension    Neuromuscular disorder ([place])    peripheral neuropathy   No past  surgical history on file. Current Outpatient Medications on File Prior to Visit  Medication Sig Dispense Refill   amLODipine (NORVASC) 5 MG tablet Take 1 tablet (5 mg total) by mouth daily. 90 tablet 3   aspirin 81 MG tablet Take 81 mg by mouth daily.     CINNAMON PO Take 2,000 mg by mouth daily.     cyanocobalamin (,VITAMIN B-12,) 1000 MCG/ML injection INJECT PRN 30 mL 3   diazepam (VALIUM) 10 MG tablet TAKE 1 TABLET BY MOUTH EVERY 8 HOURS AS NEEDED FOR MUSCLE SPASM 60 tablet 0   Efinaconazole 10 % SOLN As directed for toenail fungus 8 mL 3   Flaxseed, Linseed, (FLAX SEEDS PO) Take 1 tablet by mouth daily.     gabapentin (NEURONTIN) 300 MG capsule Take 1 capsule (300 mg total) by mouth 3 (three) times daily. 270 capsule 3   Grape Seed 100 MG CAPS Take by mouth.     LORazepam (ATIVAN) 1 MG tablet Take 1 tablet (1 mg total) by mouth at bedtime as needed. 90 tablet 0   losartan (COZAAR) 100 MG tablet Take 1 tablet by mouth once daily 90 tablet 3   Magnesium 500 MG TABS Take 500 mg by mouth.     Misc Natural Products (BLACK CHERRY CONCENTRATE PO) Take by mouth.     Polyethyl Glycol-Propyl Glycol (SYSTANE OP) Apply to eye.     rosuvastatin (CRESTOR) 20 MG tablet Take 1 tablet (20 mg total) by mouth daily. 90 tablet 3   sulfamethoxazole-trimethoprim (BACTRIM DS,SEPTRA DS) 800-160 MG tablet Take 1 tablet by mouth 2 (two) times daily. 6 tablet 0   tiZANidine (ZANAFLEX) 4 MG tablet TAKE ONE TABLET BY MOUTH EVERY 8 HOURS AS NEEDED FOR  MUSCLE  SPASMS 30 tablet 2   TURMERIC PO Take 1,000 mg by mouth.     UNABLE TO FIND Med Name: TENS UNIT DX: M54.2; M54.9 1 Units 0   hydrochlorothiazide (MICROZIDE) 12.5 MG capsule Take 1 capsule (12.5 mg total) by mouth daily. 90 capsule 3   metoprolol succinate (TOPROL-XL) 50 MG 24 hr tablet Take 1 tablet (50 mg total) by mouth daily. Take with or immediately following a meal. 90 tablet 3   No current facility-administered medications on file  prior to visit.    Allergies  Allergen Reactions   Lyrica [Pregabalin]     nightmares   Neurontin [Gabapentin]     Very whoozy   Social History   Socioeconomic History   Marital status: Married    Spouse name: Not on file   Number of children: Not on file   Years of education: Not on file   Highest education level: Not on file  Occupational History   Not on file  Social Needs  Financial resource strain: Not on file   Food insecurity    Worry: Not on file    Inability: Not on file   Transportation needs    Medical: Not on file    Non-medical: Not on file  Tobacco Use   Smoking status: Former Smoker    Quit date: 01/23/1988    Years since quitting: 31.1   Smokeless tobacco: Never Used  Substance and Sexual Activity   Alcohol use: No   Drug use: No   Sexual activity: Not on file  Lifestyle   Physical activity    Days per week: Not on file    Minutes per session: Not on file   Stress: Not on file  Relationships   Social connections    Talks on phone: Not on file    Gets together: Not on file    Attends religious service: Not on file    Active member of club or organization: Not on file    Attends meetings of clubs or organizations: Not on file    Relationship status: Not on file   Intimate partner violence    Fear of current or ex partner: Not on file    Emotionally abused: Not on file    Physically abused: Not on file    Forced sexual activity: Not on file  Other Topics Concern   Not on file  Social History Narrative   Not on file     Review of Systems  All other systems reviewed and are negative.      Objective:   Physical Exam  Constitutional: She appears well-developed and well-nourished. No distress.  Cardiovascular: Normal rate, regular rhythm and normal heart sounds.  No murmur heard. Pulmonary/Chest: Effort normal. No respiratory distress. She has no wheezes. She has no rales. She exhibits no tenderness.  Abdominal: Soft.  Bowel sounds are normal. She exhibits no distension. There is no abdominal tenderness. There is no rebound and no guarding.  Musculoskeletal:     Cervical back: She exhibits decreased range of motion, tenderness and pain.     Right hand: She exhibits decreased range of motion, tenderness and bony tenderness. Decreased sensation noted. Decreased sensation is present in the medial distribution. Normal strength noted.       Hands:  Skin: She is not diaphoretic.  Nursing note and vitals reviewed.         Assessment & Plan:  The encounter diagnosis was Trigger thumb, right thumb. Using sterile technique, a mixture of 1 cc of lidocaine and 1 cc of 40 mg/mL Kenalog was injected just adjacent to the flexor tendon at the volar surface of the right first MCP joint.  Patient tolerated the procedure well without complication.  If the pain does not improve, I would recommend a referral to a hand specialist.  I recommended the patient check her blood pressure every day and notify us of the blood pressure in 2 weeks.

## 2019-03-06 ENCOUNTER — Other Ambulatory Visit: Payer: Self-pay | Admitting: Family Medicine

## 2019-03-06 NOTE — Telephone Encounter (Signed)
Requesting refill    Valium  LOV: 03/05/19  LRF: 09/29/18

## 2019-03-09 ENCOUNTER — Telehealth: Payer: Self-pay | Admitting: Cardiovascular Disease

## 2019-03-09 ENCOUNTER — Telehealth: Payer: Self-pay | Admitting: General Practice

## 2019-03-09 NOTE — Telephone Encounter (Signed)
New Message  Patient calling the office for samples of medication:   1.  What medication and dosage are you requesting samples for? Vascepa 2 capsules two times daily  2.  Are you currently out of this medication? Yes    Patient would like to have husband Rayen Palen to pick up samples of medication due to him coming into the Lab to have blood work done on tomorrow (03/09/19 at 11 am). Please give patient a call to confirm.

## 2019-03-09 NOTE — Telephone Encounter (Signed)
Patient was informed of negative covid-19 result. Patient verbalized understanding.

## 2019-03-12 ENCOUNTER — Other Ambulatory Visit: Payer: Self-pay

## 2019-03-12 DIAGNOSIS — Z20822 Contact with and (suspected) exposure to covid-19: Secondary | ICD-10-CM

## 2019-03-12 DIAGNOSIS — R6889 Other general symptoms and signs: Secondary | ICD-10-CM | POA: Diagnosis not present

## 2019-03-12 NOTE — Telephone Encounter (Signed)
Spoke with pt and advised that we are currently out of samples. Pt voiced understanding.

## 2019-03-14 LAB — NOVEL CORONAVIRUS, NAA: SARS-CoV-2, NAA: NOT DETECTED

## 2019-06-08 ENCOUNTER — Ambulatory Visit: Payer: Medicare Other

## 2019-06-08 ENCOUNTER — Other Ambulatory Visit: Payer: Self-pay

## 2019-06-11 ENCOUNTER — Ambulatory Visit (INDEPENDENT_AMBULATORY_CARE_PROVIDER_SITE_OTHER): Payer: Medicare Other | Admitting: *Deleted

## 2019-06-11 ENCOUNTER — Other Ambulatory Visit: Payer: Self-pay

## 2019-06-11 DIAGNOSIS — Z23 Encounter for immunization: Secondary | ICD-10-CM | POA: Diagnosis not present

## 2019-06-11 NOTE — Progress Notes (Signed)
Patient seen in office for Influenza Vaccination.   Tolerated IM administration well.   Immunization history updated.  

## 2019-07-13 ENCOUNTER — Other Ambulatory Visit (HOSPITAL_COMMUNITY): Payer: Self-pay | Admitting: Family Medicine

## 2019-07-13 DIAGNOSIS — Z1231 Encounter for screening mammogram for malignant neoplasm of breast: Secondary | ICD-10-CM

## 2019-07-15 ENCOUNTER — Other Ambulatory Visit: Payer: Self-pay | Admitting: Cardiovascular Disease

## 2019-07-15 ENCOUNTER — Other Ambulatory Visit: Payer: Self-pay | Admitting: Family Medicine

## 2019-07-15 ENCOUNTER — Other Ambulatory Visit: Payer: Self-pay

## 2019-07-15 NOTE — Telephone Encounter (Signed)
Ok to refill Valium??  Last office visit 03/05/2019.  Last refill 03/06/2019.

## 2019-07-28 ENCOUNTER — Encounter: Payer: Medicare Other | Admitting: Family Medicine

## 2019-08-05 ENCOUNTER — Ambulatory Visit (HOSPITAL_COMMUNITY): Payer: Medicare Other

## 2019-09-22 ENCOUNTER — Other Ambulatory Visit: Payer: Medicare Other

## 2019-09-24 ENCOUNTER — Encounter: Payer: Medicare Other | Admitting: Family Medicine

## 2019-09-28 ENCOUNTER — Ambulatory Visit: Payer: Medicare Other

## 2019-10-05 ENCOUNTER — Other Ambulatory Visit: Payer: Self-pay | Admitting: Cardiovascular Disease

## 2019-10-19 ENCOUNTER — Ambulatory Visit (HOSPITAL_COMMUNITY)
Admission: RE | Admit: 2019-10-19 | Discharge: 2019-10-19 | Disposition: A | Discharge status: Home / Self Care | Payer: Medicare Other | Source: Ambulatory Visit | Attending: Family Medicine | Admitting: Family Medicine

## 2019-10-19 DIAGNOSIS — Z1231 Encounter for screening mammogram for malignant neoplasm of breast: Secondary | ICD-10-CM | POA: Diagnosis not present

## 2019-10-22 ENCOUNTER — Ambulatory Visit (HOSPITAL_COMMUNITY): Payer: Medicare Other

## 2019-10-26 ENCOUNTER — Other Ambulatory Visit: Payer: Self-pay | Admitting: Family Medicine

## 2019-10-26 DIAGNOSIS — Z0000 Encounter for general adult medical examination without abnormal findings: Secondary | ICD-10-CM

## 2019-10-27 ENCOUNTER — Other Ambulatory Visit: Payer: Self-pay

## 2019-10-27 ENCOUNTER — Other Ambulatory Visit: Payer: Medicare Other

## 2019-10-27 DIAGNOSIS — Z0000 Encounter for general adult medical examination without abnormal findings: Secondary | ICD-10-CM

## 2019-10-27 DIAGNOSIS — R7309 Other abnormal glucose: Secondary | ICD-10-CM | POA: Diagnosis not present

## 2019-10-27 DIAGNOSIS — Z136 Encounter for screening for cardiovascular disorders: Secondary | ICD-10-CM | POA: Diagnosis not present

## 2019-10-27 DIAGNOSIS — Z13220 Encounter for screening for lipoid disorders: Secondary | ICD-10-CM | POA: Diagnosis not present

## 2019-10-29 ENCOUNTER — Ambulatory Visit (INDEPENDENT_AMBULATORY_CARE_PROVIDER_SITE_OTHER): Payer: Medicare Other | Admitting: Family Medicine

## 2019-10-29 ENCOUNTER — Encounter: Payer: Self-pay | Admitting: Family Medicine

## 2019-10-29 ENCOUNTER — Other Ambulatory Visit: Payer: Self-pay

## 2019-10-29 VITALS — BP 144/82 | HR 100 | Temp 99.3°F | Resp 18 | Ht 67.0 in | Wt 286.0 lb

## 2019-10-29 DIAGNOSIS — R918 Other nonspecific abnormal finding of lung field: Secondary | ICD-10-CM | POA: Diagnosis not present

## 2019-10-29 DIAGNOSIS — E8881 Metabolic syndrome: Secondary | ICD-10-CM | POA: Diagnosis not present

## 2019-10-29 DIAGNOSIS — I10 Essential (primary) hypertension: Secondary | ICD-10-CM | POA: Diagnosis not present

## 2019-10-29 DIAGNOSIS — M79644 Pain in right finger(s): Secondary | ICD-10-CM | POA: Diagnosis not present

## 2019-10-29 DIAGNOSIS — E785 Hyperlipidemia, unspecified: Secondary | ICD-10-CM | POA: Diagnosis not present

## 2019-10-29 DIAGNOSIS — Z0000 Encounter for general adult medical examination without abnormal findings: Secondary | ICD-10-CM

## 2019-10-29 NOTE — Progress Notes (Signed)
Subjective:    Patient ID: Kelli Moss, female    DOB: 1951/01/05, 69 y.o.   MRN: [id number]  HPI  Patient is here today for complete physical exam however she has multiple questions and concerns.  First she would like a referral to a hand specialist.  I saw the patient in July of last year and injected her right thumb for trigger finger which helped temporarily.  However the trigger finger has returned and now she also has pain and stiffness that is constant in the right first MCP joint.  Therefore I have recommended an orthopedist for intra-articular cortisone injection.  She also has a history of a 6 mm nodule in her lung.  Most recent CT scan was in July of last year and they recommended a follow-up CT scan 1 year given her history of smoking making her high risk individual.  Patient would like to go ahead and schedule that.  She is overdue for a bone density test however she continues to refuse bone density test.  Due to her age she does not require a Pap smear.  She is scheduling her follow-up appointment with her gastroenterologist, Dr. Collene Mares or her partner Dr. Almyra Free for colon cancer screening.  She denies any active depression.  She denies any memory loss.  She denies any falls.  She does complain of severe pain in her neck.  She has severe degenerative disc disease in cervical spine with osteoarthritis.  She would like recommendations regarding epidural steroid injections as well as recommendations for surgeons.  We discussed potential options and she brought those options down and will consider this.  Her mammogram has been recently performed and was normal.  Past Medical History:  Diagnosis Date  . Colon polyp   . CTS (carpal tunnel syndrome)   . Depression   . Diabetes mellitus without complication (HCC)    prediabetes  . Dyslipidemia   . Gastritis   . Hypertension   . Neuromuscular disorder ([place])    peripheral neuropathy   No past surgical history on file. Current Outpatient  Medications on File Prior to Visit  Medication Sig Dispense Refill  . amLODipine (NORVASC) 10 MG tablet Take 1 tablet by mouth once daily 90 tablet 0  . aspirin 81 MG tablet Take 81 mg by mouth daily.    Marland Kitchen CINNAMON PO Take 2,000 mg by mouth daily.    . cyanocobalamin (,VITAMIN B-12,) 1000 MCG/ML injection INJECT PRN 30 mL 3  . diazepam (VALIUM) 10 MG tablet TAKE 1 TABLET BY MOUTH EVERY 8 HOURS AS NEEDED FOR MUSCLE SPASM 60 tablet 0  . Efinaconazole 10 % SOLN As directed for toenail fungus 8 mL 3  . Flaxseed, Linseed, (FLAX SEEDS PO) Take 1 tablet by mouth daily.    Marland Kitchen gabapentin (NEURONTIN) 300 MG capsule TAKE 1 CAPSULE BY MOUTH THREE TIMES DAILY 270 capsule 0  . Grape Seed 100 MG CAPS Take by mouth.    . hydrochlorothiazide (MICROZIDE) 12.5 MG capsule Take 1 capsule by mouth once daily 90 capsule 0  . LORazepam (ATIVAN) 1 MG tablet Take 1 tablet (1 mg total) by mouth at bedtime as needed. 90 tablet 0  . losartan (COZAAR) 100 MG tablet Take 1 tablet by mouth once daily 90 tablet 3  . Magnesium 500 MG TABS Take 500 mg by mouth.    . metoprolol succinate (TOPROL-XL) 50 MG 24 hr tablet Take 1 tablet (50 mg total) by mouth daily. NEED OV. 30 tablet 0  .  Misc Natural Products (BLACK CHERRY CONCENTRATE PO) Take by mouth.    Vladimir Faster Glycol-Propyl Glycol (SYSTANE OP) Apply to eye.    . rosuvastatin (CRESTOR) 20 MG tablet Take 1 tablet (20 mg total) by mouth daily. 90 tablet 3  . sulfamethoxazole-trimethoprim (BACTRIM DS,SEPTRA DS) 800-160 MG tablet Take 1 tablet by mouth 2 (two) times daily. 6 tablet 0  . tiZANidine (ZANAFLEX) 4 MG tablet TAKE ONE TABLET BY MOUTH EVERY 8 HOURS AS NEEDED FOR  MUSCLE  SPASMS 90 tablet 2  . TURMERIC PO Take 1,000 mg by mouth.    Marland Kitchen UNABLE TO FIND Med Name: TENS UNIT DX: M54.2; M54.9 1 Units 0   No current facility-administered medications on file prior to visit.   Allergies  Allergen Reactions  . Lyrica [Pregabalin]     nightmares  . Neurontin [Gabapentin]      Very whoozy   Social History   Socioeconomic History  . Marital status: Married    Spouse name: Not on file  . Number of children: Not on file  . Years of education: Not on file  . Highest education level: Not on file  Occupational History  . Not on file  Tobacco Use  . Smoking status: Former Smoker    Quit date: 01/23/1988    Years since quitting: 31.7  . Smokeless tobacco: Never Used  Substance and Sexual Activity  . Alcohol use: No  . Drug use: No  . Sexual activity: Not on file  Other Topics Concern  . Not on file  Social History Narrative  . Not on file   Social Determinants of Health   Financial Resource Strain:   . Difficulty of Paying Living Expenses:   Food Insecurity:   . Worried About Charity fundraiser in the Last Year:   . Arboriculturist in the Last Year:   Transportation Needs:   . Film/video editor (Medical):   Marland Kitchen Lack of Transportation (Non-Medical):   Physical Activity:   . Days of Exercise per Week:   . Minutes of Exercise per Session:   Stress:   . Feeling of Stress :   Social Connections:   . Frequency of Communication with Friends and Family:   . Frequency of Social Gatherings with Friends and Family:   . Attends Religious Services:   . Active Member of Clubs or Organizations:   . Attends Archivist Meetings:   Marland Kitchen Marital Status:   Intimate Partner Violence:   . Fear of Current or Ex-Partner:   . Emotionally Abused:   Marland Kitchen Physically Abused:   . Sexually Abused:    No family history on file.    Review of Systems  All other systems reviewed and are negative.      Objective:   Physical Exam  Constitutional: She is oriented to person, place, and time. She appears well-developed and well-nourished. No distress.  HENT:  Head: Normocephalic and atraumatic.  Right Ear: External ear normal.  Left Ear: External ear normal.  Nose: Nose normal.  Mouth/Throat: Oropharynx is clear and moist. No oropharyngeal exudate.  Eyes:  Pupils are equal, round, and reactive to light. Conjunctivae and EOM are normal. No scleral icterus.  Neck: No JVD present. No tracheal deviation present. No thyromegaly present.  Cardiovascular: Normal rate, regular rhythm and normal heart sounds. Exam reveals no gallop and no friction rub.  No murmur heard. Pulmonary/Chest: Effort normal and breath sounds normal. No stridor. No respiratory distress. She has no wheezes.  She has no rales. She exhibits no tenderness.  Abdominal: Soft. Bowel sounds are normal. She exhibits no distension and no mass. There is no abdominal tenderness. There is no rebound and no guarding.  Musculoskeletal:        General: No tenderness or edema. Normal range of motion.     Cervical back: Normal range of motion and neck supple.  Lymphadenopathy:    She has no cervical adenopathy.  Neurological: She is alert and oriented to person, place, and time. She has normal reflexes. No cranial nerve deficit. She exhibits normal muscle tone. Coordination normal.  Skin: Skin is warm. No rash noted. She is not diaphoretic. No erythema. No pallor.  Psychiatric: Her behavior is normal. Judgment and thought content normal. Her mood appears anxious. Her affect is labile.  Vitals reviewed.  Lab on 10/27/2019  Component Date Value Ref Range Status  . WBC 10/27/2019 7.3  3.8 - 10.8 Thousand/uL Final  . RBC 10/27/2019 4.91  3.80 - 5.10 Million/uL Final  . Hemoglobin 10/27/2019 14.1  11.7 - 15.5 g/dL Final  . HCT 10/27/2019 42.2  35.0 - 45.0 % Final  . MCV 10/27/2019 85.9  80.0 - 100.0 fL Final  . MCH 10/27/2019 28.7  27.0 - 33.0 pg Final  . MCHC 10/27/2019 33.4  32.0 - 36.0 g/dL Final  . RDW 10/27/2019 14.0  11.0 - 15.0 % Final  . Platelets 10/27/2019 221  140 - 400 Thousand/uL Final  . MPV 10/27/2019 10.1  7.5 - 12.5 fL Final  . Neutro Abs 10/27/2019 4,650  1,500 - 7,800 cells/uL Final  . Lymphs Abs 10/27/2019 1,745  850 - 3,900 cells/uL Final  . Absolute Monocytes 10/27/2019 664   200 - 950 cells/uL Final  . Eosinophils Absolute 10/27/2019 183  15 - 500 cells/uL Final  . Basophils Absolute 10/27/2019 58  0 - 200 cells/uL Final  . Neutrophils Relative % 10/27/2019 63.7  % Final  . Total Lymphocyte 10/27/2019 23.9  % Final  . Monocytes Relative 10/27/2019 9.1  % Final  . Eosinophils Relative 10/27/2019 2.5  % Final  . Basophils Relative 10/27/2019 0.8  % Final  . Glucose, Bld 10/27/2019 136* 65 - 99 mg/dL Final   Comment: .            Fasting reference interval . For someone without known diabetes, a glucose value >125 mg/dL indicates that they may have diabetes and this should be confirmed with a follow-up test. .   . BUN 10/27/2019 13  7 - 25 mg/dL Final  . Creat 10/27/2019 0.70  0.50 - 0.99 mg/dL Final   Comment: For patients >68 years of age, the reference limit for Creatinine is approximately 13% higher for people identified as African-American. .   Havery Moros Ratio [id number] NOT APPLICABLE  6 - 22 (calc) Final  . Sodium 10/27/2019 139  135 - 146 mmol/L Final  . Potassium 10/27/2019 3.7  3.5 - 5.3 mmol/L Final  . Chloride 10/27/2019 104  98 - 110 mmol/L Final  . CO2 10/27/2019 24  20 - 32 mmol/L Final  . Calcium 10/27/2019 8.9  8.6 - 10.4 mg/dL Final  . Total Protein 10/27/2019 6.6  6.1 - 8.1 g/dL Final  . Albumin 10/27/2019 3.9  3.6 - 5.1 g/dL Final  . Globulin 10/27/2019 2.7  1.9 - 3.7 g/dL (calc) Final  . AG Ratio 10/27/2019 1.4  1.0 - 2.5 (calc) Final  . Total Bilirubin 10/27/2019 0.4  0.2 - 1.2 mg/dL Final  .  Alkaline phosphatase (APISO) 10/27/2019 67  37 - 153 U/L Final  . AST 10/27/2019 17  10 - 35 U/L Final  . ALT 10/27/2019 20  6 - 29 U/L Final  . Cholesterol 10/27/2019 134  <200 mg/dL Final  . HDL 10/27/2019 28* > OR = 50 mg/dL Final  . Triglycerides 10/27/2019 354* <150 mg/dL Final   Comment: . If a non-fasting specimen was collected, consider repeat triglyceride testing on a fasting specimen if clinically indicated.   Yates Decamp et al. J. of Clin. Lipidol. [id number]. .   . LDL Cholesterol (Calc) 10/27/2019 63  mg/dL (calc) Final   Comment: Reference range: <100 . Desirable range <100 mg/dL for primary prevention;   <70 mg/dL for patients with CHD or diabetic patients  with > or = 2 CHD risk factors. Marland Kitchen LDL-C is now calculated using the Martin-Hopkins  calculation, which is a validated novel method providing  better accuracy than the Friedewald equation in the  estimation of LDL-C.  Cresenciano Genre et al. Annamaria Helling. [id number]): 2061-2068  (http://education.[facility].com/faq/FAQ164)   . Total CHOL/HDL Ratio 10/27/2019 4.8  <5.0 (calc) Final  . Non-HDL Cholesterol (Calc) 10/27/2019 106  <130 mg/dL (calc) Final   Comment: For patients with diabetes plus 1 major ASCVD risk  factor, treating to a non-HDL-C goal of <100 mg/dL  (LDL-C of <70 mg/dL) is considered a therapeutic  option.   . TEST NAME: 10/27/2019 HEMOGLOBIN A1c   Final  . TEST CODE: 10/27/2019 [id number]   Final  . CLIENT CONTACT: 10/27/2019 Learta Codding   Final  . REPORT ALWAYS MESSAGE SIGNATURE 10/27/2019    Final   Comment: . The laboratory testing on this patient was verbally requested or confirmed by the ordering physician or his or her authorized representative after contact with an employee of [employer]. Federal regulations require that we maintain on file written authorization for all laboratory testing.  Accordingly we are asking that the ordering physician or his or her authorized representative sign a copy of this report and promptly return it to the client service representative. . . Signature:____________________________________________________ . Please fax this signed page to [phone] or return it via your [employer] courier.            Assessment & Plan:  General medical exam  Benign essential HTN  Dyslipidemia  Metabolic syndrome  Thumb pain, right - Plan: Ambulatory referral to Hand  Surgery  Lung nodules - Plan: CT Chest W Contrast  I have placed a referral for a CT scan of the lung to follow-up a 6 mm lung nodule.  Is stable on this exam no further imaging is required.  I have also placed referral to orthopedic hand specialist for cortisone injection in the right first MCP joint as well as management of trigger finger.  Mammogram is up-to-date.  Colonoscopy is up-to-date.  Patient does not require Pap smear.  Patient refuses bone density test.  Lab work is most concerning today for a fasting blood sugar 136.  At the time of this dictation a hemoglobin A1c is pending however we did spend time discussing options for diabetes including Metformin.  After weighing her options, the patient would like to try Trulicity for weight loss and given the fact that she has terrible diarrhea.  Patient has known dyslipidemia with triglycerides over 300 and HDL of 28.  This is been recalcitrant to any treatment thus far.  I continue to encourage exercise diet weight loss.  Trulicity I do think will be beneficial in  this regard.

## 2019-10-30 LAB — LIPID PANEL
Cholesterol: 134 mg/dL (ref <200)
HDL: 28 mg/dL — ABNORMAL LOW (ref >=50)
LDL Cholesterol (Calc): 63 mg/dL (calc)
Non-HDL Cholesterol (Calc): 106 mg/dL (calc) (ref <130)
Total CHOL/HDL Ratio: 4.8 (calc) (ref <5.0)
Triglycerides: 354 mg/dL — ABNORMAL HIGH (ref <150)

## 2019-10-30 LAB — COMPREHENSIVE METABOLIC PANEL
AG Ratio: 1.4 (calc) (ref 1.0–2.5)
ALT: 20 U/L (ref 6–29)
AST: 17 U/L (ref 10–35)
Albumin: 3.9 g/dL (ref 3.6–5.1)
Alkaline phosphatase (APISO): 67 U/L (ref 37–153)
BUN: 13 mg/dL (ref 7–25)
CO2: 24 mmol/L (ref 20–32)
Calcium: 8.9 mg/dL (ref 8.6–10.4)
Chloride: 104 mmol/L (ref 98–110)
Creat: 0.7 mg/dL (ref 0.50–0.99)
Globulin: 2.7 g/dL (calc) (ref 1.9–3.7)
Glucose, Bld: 136 mg/dL — ABNORMAL HIGH (ref 65–99)
Potassium: 3.7 mmol/L (ref 3.5–5.3)
Sodium: 139 mmol/L (ref 135–146)
Total Bilirubin: 0.4 mg/dL (ref 0.2–1.2)
Total Protein: 6.6 g/dL (ref 6.1–8.1)

## 2019-10-30 LAB — CBC WITH DIFFERENTIAL/PLATELET
Absolute Monocytes: 664 cells/uL (ref 200–950)
Basophils Absolute: 58 cells/uL (ref 0–200)
Basophils Relative: 0.8 %
Eosinophils Absolute: 183 cells/uL (ref 15–500)
Eosinophils Relative: 2.5 %
HCT: 42.2 % (ref 35.0–45.0)
Hemoglobin: 14.1 g/dL (ref 11.7–15.5)
Lymphs Abs: 1745 cells/uL (ref 850–3900)
MCH: 28.7 pg (ref 27.0–33.0)
MCHC: 33.4 g/dL (ref 32.0–36.0)
MCV: 85.9 fL (ref 80.0–100.0)
MPV: 10.1 fL (ref 7.5–12.5)
Monocytes Relative: 9.1 %
Neutro Abs: 4650 cells/uL (ref 1500–7800)
Neutrophils Relative %: 63.7 %
Platelets: 221 10*3/uL (ref 140–400)
RBC: 4.91 10*6/uL (ref 3.80–5.10)
RDW: 14 % (ref 11.0–15.0)
Total Lymphocyte: 23.9 %
WBC: 7.3 10*3/uL (ref 3.8–10.8)

## 2019-10-30 LAB — HEMOGLOBIN A1C W/OUT EAG: Hgb A1c MFr Bld: 6 % of total Hgb — ABNORMAL HIGH (ref <5.7)

## 2019-10-30 LAB — TEST AUTHORIZATION

## 2019-11-04 ENCOUNTER — Encounter: Payer: Self-pay | Admitting: *Deleted

## 2019-11-04 DIAGNOSIS — M19041 Primary osteoarthritis, right hand: Secondary | ICD-10-CM | POA: Diagnosis not present

## 2019-11-04 DIAGNOSIS — M65341 Trigger finger, right ring finger: Secondary | ICD-10-CM | POA: Diagnosis not present

## 2019-11-04 DIAGNOSIS — M1811 Unilateral primary osteoarthritis of first carpometacarpal joint, right hand: Secondary | ICD-10-CM | POA: Insufficient documentation

## 2019-11-04 DIAGNOSIS — M79641 Pain in right hand: Secondary | ICD-10-CM | POA: Insufficient documentation

## 2019-11-04 DIAGNOSIS — M65311 Trigger thumb, right thumb: Secondary | ICD-10-CM | POA: Insufficient documentation

## 2019-11-04 DIAGNOSIS — G5601 Carpal tunnel syndrome, right upper limb: Secondary | ICD-10-CM | POA: Diagnosis not present

## 2019-11-11 DIAGNOSIS — G5601 Carpal tunnel syndrome, right upper limb: Secondary | ICD-10-CM | POA: Diagnosis not present

## 2019-11-11 DIAGNOSIS — G5603 Carpal tunnel syndrome, bilateral upper limbs: Secondary | ICD-10-CM | POA: Diagnosis not present

## 2019-11-12 ENCOUNTER — Other Ambulatory Visit: Payer: Self-pay | Admitting: Cardiovascular Disease

## 2019-11-12 NOTE — Telephone Encounter (Signed)
*  STAT* If patient is at the pharmacy, call can be transferred to refill team.   1. Which medications need to be refilled? (please list name of each medication and dose if known)  metoprolol succinate (TOPROL-XL) 50 MG 24 hr tablet  2. Which pharmacy/location (including street and city if local pharmacy) is medication to be sent to?  [facility] [place] - [address]  3. Do they need a 30 day or 90 day supply? 90  Patient may not have enough meds to last until her appt with the PA 12-03-19

## 2019-11-16 ENCOUNTER — Other Ambulatory Visit: Payer: Self-pay | Admitting: Cardiovascular Disease

## 2019-11-17 ENCOUNTER — Telehealth: Payer: Self-pay | Admitting: Cardiovascular Disease

## 2019-11-17 MED ORDER — METOPROLOL SUCCINATE ER 50 MG PO TB24: 50.0000 mg | ORAL_TABLET | Freq: Every day | ORAL | 0 refills | Status: DC

## 2019-11-17 NOTE — Telephone Encounter (Signed)
Nurse informed pt that an additional 30 day supply of Metoprolol would be sent to pharmacy but to ensure she keep OV scheduled for 4/15. Pt verbalized understanding.

## 2019-11-17 NOTE — Telephone Encounter (Signed)
New Message  Pt c/o medication issue:  1. Name of Medication:   metoprolol succinate (TOPROL-XL) 50 MG 24 hr tablet   2. How are you currently taking this medication (dosage and times per day)? As written  3. Are you having a reaction (difficulty breathing--STAT)? No  4. What is your medication issue? Patient has scheduled an appt with APP and needs prescription filled and sent to [facility] asap

## 2019-12-03 ENCOUNTER — Ambulatory Visit (INDEPENDENT_AMBULATORY_CARE_PROVIDER_SITE_OTHER): Payer: Medicare Other | Admitting: Physician Assistant

## 2019-12-03 ENCOUNTER — Other Ambulatory Visit: Payer: Self-pay

## 2019-12-03 ENCOUNTER — Encounter: Payer: Self-pay | Admitting: Physician Assistant

## 2019-12-03 VITALS — BP 136/60 | HR 94 | Temp 97.7°F | Ht 67.0 in | Wt 284.0 lb

## 2019-12-03 DIAGNOSIS — R7303 Prediabetes: Secondary | ICD-10-CM

## 2019-12-03 DIAGNOSIS — R600 Localized edema: Secondary | ICD-10-CM | POA: Diagnosis not present

## 2019-12-03 DIAGNOSIS — I2510 Atherosclerotic heart disease of native coronary artery without angina pectoris: Secondary | ICD-10-CM | POA: Diagnosis not present

## 2019-12-03 DIAGNOSIS — I10 Essential (primary) hypertension: Secondary | ICD-10-CM | POA: Diagnosis not present

## 2019-12-03 DIAGNOSIS — E785 Hyperlipidemia, unspecified: Secondary | ICD-10-CM

## 2019-12-03 DIAGNOSIS — R911 Solitary pulmonary nodule: Secondary | ICD-10-CM

## 2019-12-03 DIAGNOSIS — G4733 Obstructive sleep apnea (adult) (pediatric): Secondary | ICD-10-CM

## 2019-12-03 MED ORDER — OMEGA-3-ACID ETHYL ESTERS 1 G PO CAPS: 1.0000 g | ORAL_CAPSULE | Freq: Two times a day (BID) | ORAL | 3 refills | Status: DC

## 2019-12-03 NOTE — Patient Instructions (Signed)
Medication Instructions:   START Lovaza 1000 mg (1gram) 2 times a day  *If you need a refill on your cardiac medications before your next appointment, please call your pharmacy*  Lab Work: NONE ordered at this time of appointment   If you have labs (blood work) drawn today and your tests are completely normal, you will receive your results only by: Marland Kitchen MyChart Message (if you have MyChart) OR . A paper copy in the mail If you have any lab test that is abnormal or we need to change your treatment, we will call you to review the results.  Testing/Procedures: NONE ordered at this time of appointment   Follow-Up: At [facility], you and your health needs are our priority.  As part of our continuing mission to provide you with exceptional heart care, we have created designated Provider Care Teams.  These Care Teams include your primary Cardiologist (physician) and Advanced Practice Providers (APPs -  Physician Assistants and Nurse Practitioners) who all work together to provide you with the care you need, when you need it.  We recommend signing up for the patient portal called "MyChart".  Sign up information is provided on this After Visit Summary.  MyChart is used to connect with patients for Virtual Visits (Telemedicine).  Patients are able to view lab/test results, encounter notes, upcoming appointments, etc.  Non-urgent messages can be sent to your provider as well.   To learn more about what you can do with MyChart, go to [email or web address].    Your next appointment:   1 year(s)  The format for your next appointment:   In Person  Provider:   Shelva Majestic, MD  Other Instructions

## 2019-12-03 NOTE — Progress Notes (Signed)
Cardiology Office Note:    Date:  12/05/2019   ID:  Kelli Moss, DOB April 17, 1951, MRN [id number]  PCP:  [name], MD  Cardiologist:  Shelva Majestic, MD  Electrophysiologist:  None   Referring MD: [name], MD   Chief Complaint  Patient presents with  . Follow-up    History of Present Illness:    Kelli Moss is a 69 y.o. female with a hx of HTN, HLD, OSA on CPAP, depression, prediabetes and peripheral neuropathy.  She also has history of hiatal hernia and celiac disease followed by GI service as well as history of pernicious anemia.  She was last seen by Dr. Claiborne Billings on 07/31/2018, her blood pressure was quite elevated at the time.  Dr. Claiborne Billings decreased her amlodipine to 5 mg daily and added Toprol-XL, hydrochlorothiazide to her medical regimen.  Echocardiogram obtained on 08/07/2018 showed EF 65 to 70%, grade 1 DD, mild LAE, moderate LVH.  Coronary CT obtained on 09/06/2018 showed 6 mm right middle lobe lung nodule with recommended repeat CT in 6 to 12 months recommended, less than 30% calcific plaque in the LAD, otherwise normal coronaries in other vessels.  Overall poor quality study and would not recommend repeat, coronary calcium score of 23 which placed the patient in the 62nd percentile for age and sex matched control, normal sized aortic root. Repeat CT of chest obtained on 03/02/2019 showed a stable 6 mm nodule in the right middle lobe, diffuse dilatation of the esophagus suggestive of esophagitis and dysmotility disorder.  Patient presents today for cardiology office visit.  She also has some lower extremity edema like her husband.  Her edema seems to get worse throughout the day, this is likely dependent edema related to body habitus and venous insufficiency.  I offered to decrease her amlodipine while increasing her Toprol-XL, she prefer not to go this way.  Her triglyceride is uncontrolled, unfortunately Vescepa was cost prohibitive, I recommended starting on Lovaza  1000 mg twice daily.  She can have her lipid panel obtained by PCP during the next annual follow-up.  Otherwise she can follow-up in 1 year.    Past Medical History:  Diagnosis Date  . Colon polyp   . CTS (carpal tunnel syndrome)   . Depression   . Diabetes mellitus without complication (HCC)    prediabetes  . Dyslipidemia   . Gastritis   . Hypertension   . Neuromuscular disorder ([place])    peripheral neuropathy    History reviewed. No pertinent surgical history.  Current Medications: Current Meds  Medication Sig  . amLODipine (NORVASC) 10 MG tablet Take 1 tablet by mouth once daily  . aspirin 81 MG tablet Take 81 mg by mouth daily.  Marland Kitchen CINNAMON PO Take 2,000 mg by mouth daily.  . cyanocobalamin (,VITAMIN B-12,) 1000 MCG/ML injection INJECT PRN  . diazepam (VALIUM) 10 MG tablet TAKE 1 TABLET BY MOUTH EVERY 8 HOURS AS NEEDED FOR MUSCLE SPASM  . Efinaconazole 10 % SOLN As directed for toenail fungus  . Flaxseed, Linseed, (FLAX SEEDS PO) Take 1 tablet by mouth daily.  Marland Kitchen gabapentin (NEURONTIN) 300 MG capsule TAKE 1 CAPSULE BY MOUTH THREE TIMES DAILY  . Grape Seed 100 MG CAPS Take by mouth.  . hydrochlorothiazide (MICROZIDE) 12.5 MG capsule Take 1 capsule by mouth once daily  . LORazepam (ATIVAN) 1 MG tablet Take 1 tablet (1 mg total) by mouth at bedtime as needed.  Marland Kitchen losartan (COZAAR) 100 MG tablet Take 1 tablet by  mouth once daily  . Magnesium 500 MG TABS Take 500 mg by mouth.  . metoprolol succinate (TOPROL-XL) 50 MG 24 hr tablet Take 1 tablet (50 mg total) by mouth daily. PLEASE KEEP OV ON 4/15  . Misc Natural Products (BLACK CHERRY CONCENTRATE PO) Take by mouth.  Vladimir Faster Glycol-Propyl Glycol (SYSTANE OP) Apply to eye.  Marland Kitchen tiZANidine (ZANAFLEX) 4 MG tablet TAKE ONE TABLET BY MOUTH EVERY 8 HOURS AS NEEDED FOR  MUSCLE  SPASMS  . TURMERIC PO Take 1,000 mg by mouth.  Marland Kitchen UNABLE TO FIND Med Name: TENS UNIT DX: M54.2; M54.9     Allergies:   Lyrica [pregabalin]   Social History    Socioeconomic History  . Marital status: Married    Spouse name: Not on file  . Number of children: Not on file  . Years of education: Not on file  . Highest education level: Not on file  Occupational History  . Not on file  Tobacco Use  . Smoking status: Former Smoker    Quit date: 01/23/1988    Years since quitting: 31.8  . Smokeless tobacco: Never Used  Substance and Sexual Activity  . Alcohol use: No  . Drug use: No  . Sexual activity: Not on file  Other Topics Concern  . Not on file  Social History Narrative  . Not on file   Social Determinants of Health   Financial Resource Strain:   . Difficulty of Paying Living Expenses:   Food Insecurity:   . Worried About Charity fundraiser in the Last Year:   . Arboriculturist in the Last Year:   Transportation Needs:   . Film/video editor (Medical):   Marland Kitchen Lack of Transportation (Non-Medical):   Physical Activity:   . Days of Exercise per Week:   . Minutes of Exercise per Session:   Stress:   . Feeling of Stress :   Social Connections:   . Frequency of Communication with Friends and Family:   . Frequency of Social Gatherings with Friends and Family:   . Attends Religious Services:   . Active Member of Clubs or Organizations:   . Attends Archivist Meetings:   Marland Kitchen Marital Status:      Family History: The patient's family history is not on file.  ROS:   Please see the history of present illness.     All other systems reviewed and are negative.  EKGs/Labs/Other Studies Reviewed:    The following studies were reviewed today:  Echo 08/07/2018 LV EF: 65% -  70%  Study Conclusions   - Left ventricle: The cavity size was normal. Wall thickness was  increased in a pattern of moderate LVH. Systolic function was  vigorous. The estimated ejection fraction was in the range of 65%  to 70%. Wall motion was normal; there were no regional wall  motion abnormalities. Doppler parameters are consistent  with  abnormal left ventricular relaxation (grade 1 diastolic  dysfunction). Doppler parameters are consistent with high  ventricular filling pressure.  - Mitral valve: Calcified annulus.  - Left atrium: The atrium was mildly dilated.   Impressions:   - Technically difficult; definity used; vigorous LV systolic  function; mild diastolic dysfunction; moderate LVH; mild LAE.    Coronary CT 09/06/2018 FINDINGS: Non-cardiac: See separate report from [facility]. No significant findings on limited lung and soft tissue windows.  Calcium Score: Mild calcium noted in LAD  Coronary Arteries: Right dominant with no anomalies  LM:  Normal  LAD: Less then 30% calcific plaque in the mid and distal LAD  D1: Normal  D2: Normal  Circumflex: Normal  OM1: Normal  OM2: Normal  RCA: Normal  PDA: Normal  PLA: Normal  IMPRESSION: Poor quality study Would not repeat. Poor opacification and relatively high HR  No obstructive CAD see description above  Calcium score 23 which is 62 nd percentile for age and sex  Normal aortic root 3.3 cm   EKG:  EKG is ordered today.  The ekg ordered today demonstrates sinus rhythm with right bundle branch block.  Recent Labs: 10/27/2019: ALT 20; BUN 13; Creat 0.70; Hemoglobin 14.1; Platelets 221; Potassium 3.7; Sodium 139  Recent Lipid Panel    Component Value Date/Time   CHOL 134 10/27/2019 0801   TRIG 354 (H) 10/27/2019 0801   HDL 28 (L) 10/27/2019 0801   CHOLHDL 4.8 10/27/2019 0801   VLDL 48 (H) 12/27/2016 0809   LDLCALC 63 10/27/2019 0801    Physical Exam:    VS:  BP 136/60 (BP Location: Left Arm, Patient Position: Sitting, Cuff Size: Large)   Pulse 94   Temp 97.7 F (36.5 C)   Ht 5\' 7"  (1.702 m)   Wt 284 lb (128.8 kg)   BMI 44.48 kg/m     Wt Readings from Last 3 Encounters:  12/03/19 284 lb (128.8 kg)  10/29/19 286 lb (129.7 kg)  03/05/19 284 lb (128.8 kg)     GEN:  Well nourished, well  developed in no acute distress HEENT: Normal NECK: No JVD; No carotid bruits LYMPHATICS: No lymphadenopathy CARDIAC: RRR, no murmurs, rubs, gallops RESPIRATORY:  Clear to auscultation without rales, wheezing or rhonchi  ABDOMEN: Soft, non-tender, non-distended MUSCULOSKELETAL:  Trace edema; No deformity  SKIN: Warm and dry NEUROLOGIC:  Alert and oriented x 3 PSYCHIATRIC:  Normal affect   ASSESSMENT:    1. Leg edema   2. Essential hypertension   3. Hyperlipidemia LDL goal <70   4. OSA (obstructive sleep apnea)   5. Prediabetes   6. Coronary artery disease involving native coronary artery of native heart without angina pectoris   7. Lung nodule    PLAN:    In order of problems listed above:  1. Leg edema: Likely dependent edema secondary to venous stasis and the body habitus.  I recommended conservative management such as salt restriction and leg elevation.  She is on a low-dose hydrochlorothiazide, I did discuss with her potentially switch this to a stronger diuretic such as Lasix, she wished to hold off for now.  2. Hypertension: Blood pressure stable  3. Hyperlipidemia: Triglyceride is not controlled, she could not afford Vascepa due to cost, I will switch her to Lovaza 1000 mg twice daily.  Annual lipid panel per primary care provider  4. Obstructive sleep apnea: Continue CPAP therapy  5. Prediabetes: Managed by primary care provider  6. CAD: Minimal at best based on previous coronary CT in 2010.  On aspirin.  7. Lung nodule: Stable on last CT of chest in July 2020.   Medication Adjustments/Labs and Tests Ordered: Current medicines are reviewed at length with the patient today.  Concerns regarding medicines are outlined above.  Orders Placed This Encounter  Procedures  . EKG 12-Lead   Meds ordered this encounter  Medications  . DISCONTD: omega-3 acid ethyl esters (LOVAZA) 1 g capsule    Sig: Take 1 capsule (1 g total) by mouth 2 (two) times daily.    Dispense:   180 capsule  Refill:  3    Patient Instructions  Medication Instructions:   START Lovaza 1000 mg (1gram) 2 times a day  *If you need a refill on your cardiac medications before your next appointment, please call your pharmacy*  Lab Work: NONE ordered at this time of appointment   If you have labs (blood work) drawn today and your tests are completely normal, you will receive your results only by: Marland Kitchen MyChart Message (if you have MyChart) OR . A paper copy in the mail If you have any lab test that is abnormal or we need to change your treatment, we will call you to review the results.  Testing/Procedures: NONE ordered at this time of appointment   Follow-Up: At [facility], you and your health needs are our priority.  As part of our continuing mission to provide you with exceptional heart care, we have created designated Provider Care Teams.  These Care Teams include your primary Cardiologist (physician) and Advanced Practice Providers (APPs -  Physician Assistants and Nurse Practitioners) who all work together to provide you with the care you need, when you need it.  We recommend signing up for the patient portal called "MyChart".  Sign up information is provided on this After Visit Summary.  MyChart is used to connect with patients for Virtual Visits (Telemedicine).  Patients are able to view lab/test results, encounter notes, upcoming appointments, etc.  Non-urgent messages can be sent to your provider as well.   To learn more about what you can do with MyChart, go to [email or web address].    Your next appointment:   1 year(s)  The format for your next appointment:   In Person  Provider:   Shelva Majestic, MD  Other Instructions      Signed, Almyra Deforest, [place]  12/05/2019 9:28 PM    [place]

## 2019-12-04 ENCOUNTER — Other Ambulatory Visit: Payer: Self-pay | Admitting: Physician Assistant

## 2019-12-04 MED ORDER — OMEGA-3-ACID ETHYL ESTERS 1 G PO CAPS: 1.0000 g | ORAL_CAPSULE | Freq: Two times a day (BID) | ORAL | 1 refills | Status: DC

## 2019-12-04 NOTE — Telephone Encounter (Signed)
*  STAT* If patient is at the pharmacy, call can be transferred to refill team.   1. Which medications need to be refilled? (please list name of each medication and dose if known)  omega-3 acid ethyl esters (LOVAZA) 1 g capsule  2. Which pharmacy/location (including street and city if local pharmacy) is medication to be sent to? [facility] [place]  3. Do they need a 30 day or 90 day supply? 90 day supply    Prescription is to expensive at [facility] and the [place] advised the patient it would take 5 days to transfer to [facility] so it would be quicker to call in for another prescription to be sent. Please advise.

## 2019-12-04 NOTE — Addendum Note (Signed)
Addended by: Venetia Maxon on: 12/04/2019 10:46 AM   Modules accepted: Orders

## 2019-12-05 ENCOUNTER — Encounter: Payer: Self-pay | Admitting: Physician Assistant

## 2019-12-07 DIAGNOSIS — R1310 Dysphagia, unspecified: Secondary | ICD-10-CM | POA: Diagnosis not present

## 2019-12-07 DIAGNOSIS — K625 Hemorrhage of anus and rectum: Secondary | ICD-10-CM | POA: Diagnosis not present

## 2019-12-07 DIAGNOSIS — K219 Gastro-esophageal reflux disease without esophagitis: Secondary | ICD-10-CM | POA: Diagnosis not present

## 2019-12-09 DIAGNOSIS — G5603 Carpal tunnel syndrome, bilateral upper limbs: Secondary | ICD-10-CM | POA: Diagnosis not present

## 2019-12-09 DIAGNOSIS — M65311 Trigger thumb, right thumb: Secondary | ICD-10-CM | POA: Diagnosis not present

## 2019-12-09 DIAGNOSIS — M65341 Trigger finger, right ring finger: Secondary | ICD-10-CM | POA: Diagnosis not present

## 2019-12-10 ENCOUNTER — Ambulatory Visit (INDEPENDENT_AMBULATORY_CARE_PROVIDER_SITE_OTHER): Payer: Medicare Other | Admitting: Dermatology

## 2019-12-10 ENCOUNTER — Other Ambulatory Visit: Payer: Self-pay

## 2019-12-10 ENCOUNTER — Other Ambulatory Visit: Payer: Self-pay | Admitting: Family Medicine

## 2019-12-10 ENCOUNTER — Other Ambulatory Visit: Payer: Self-pay | Admitting: Cardiovascular Disease

## 2019-12-10 DIAGNOSIS — L814 Other melanin hyperpigmentation: Secondary | ICD-10-CM

## 2019-12-10 DIAGNOSIS — L820 Inflamed seborrheic keratosis: Secondary | ICD-10-CM

## 2019-12-10 DIAGNOSIS — Z1283 Encounter for screening for malignant neoplasm of skin: Secondary | ICD-10-CM | POA: Diagnosis not present

## 2019-12-10 DIAGNOSIS — L719 Rosacea, unspecified: Secondary | ICD-10-CM

## 2019-12-10 DIAGNOSIS — L578 Other skin changes due to chronic exposure to nonionizing radiation: Secondary | ICD-10-CM | POA: Diagnosis not present

## 2019-12-10 DIAGNOSIS — L853 Xerosis cutis: Secondary | ICD-10-CM

## 2019-12-10 DIAGNOSIS — L821 Other seborrheic keratosis: Secondary | ICD-10-CM

## 2019-12-10 DIAGNOSIS — D1800 Hemangioma unspecified site: Secondary | ICD-10-CM | POA: Diagnosis not present

## 2019-12-10 DIAGNOSIS — D229 Melanocytic nevi, unspecified: Secondary | ICD-10-CM | POA: Diagnosis not present

## 2019-12-10 NOTE — Patient Instructions (Signed)

## 2019-12-10 NOTE — Progress Notes (Signed)
   Follow-Up Visit   Subjective  Kelli Moss is a 69 y.o. female who presents for the following: Annual Exam (No history of skin cancer or abnormal moles).  Patient presents for skin cancer screening and mole check and total-body skin exam.  The following portions of the chart were reviewed this encounter and updated as appropriate: Tobacco  Allergies  Meds  Problems  Med Hx  Surg Hx  Fam Hx      Review of Systems: No other skin or systemic complaints.  Objective  Well appearing patient in no apparent distress; mood and affect are within normal limits.  A full examination was performed including scalp, head, eyes, ears, nose, lips, neck, chest, axillae, abdomen, back, buttocks, bilateral upper extremities, bilateral lower extremities, hands, feet, fingers, toes, fingernails, and toenails. All findings within normal limits unless otherwise noted below.  Objective  Right mid pretibial: Erythematous keratotic or waxy stuck-on papule or plaque.   Objective  Head - Anterior (Face): Dilated blood vessels.  Objective  Right Foot - Anterior: Xerosis.  Assessment & Plan    Skin cancer screening performed today.  Actinic Damage - diffuse scaly erythematous macules with underlying dyspigmentation - Recommend daily broad spectrum sunscreen SPF 30+ to sun-exposed areas, reapply every 2 hours as needed.  - Call for new or changing lesions.  Seborrheic Keratoses - Stuck-on, waxy, tan-brown papules and plaques  - Discussed benign etiology and prognosis. - Observe - Call for any changes  Lentigines - Scattered tan macules - Discussed due to sun exposure - Benign, observe - Call for any changes  Melanocytic Nevi - Tan-brown and/or pink-flesh-colored symmetric macules and papules - Benign appearing on exam today - Observation - Call clinic for new or changing moles - Recommend daily use of broad spectrum spf 30+ sunscreen to sun-exposed areas.   Hemangiomas - Red  papules - Discussed benign nature - Observe - Call for any changes  Acrochordons (Skin Tags) - Fleshy, skin-colored pedunculated papules - Benign appearing.  - Observe. - If desired, they can be removed with an in office procedure that is not covered by insurance. - Please call the clinic if you notice any new or changing lesions. Inflamed seborrheic keratosis Right mid pretibial  Destruction of lesion - Right mid pretibial Complexity: simple   Destruction method: cryotherapy   Informed consent: discussed and consent obtained   Timeout:  patient name, date of birth, surgical site, and procedure verified Lesion destroyed using liquid nitrogen: Yes   Region frozen until ice ball extended beyond lesion: Yes   Outcome: patient tolerated procedure well with no complications   Post-procedure details: wound care instructions given    Rosacea Head - Anterior (Face)  Discussed BBL treatments.  Xerosis cutis Right Foot - Anterior  Xerosis with Icthyosis - Recommend AmLactin Rapid Relief.  Return in about 1 year (around 12/09/2020).   I, Ashok Cordia, CMA, am acting as scribe for Sarina Ser, MD .  Documentation: I have reviewed the above documentation for accuracy and completeness, and I agree with the above.  Sarina Ser, MD

## 2019-12-11 ENCOUNTER — Encounter: Payer: Self-pay | Admitting: Dermatology

## 2019-12-11 DIAGNOSIS — M25641 Stiffness of right hand, not elsewhere classified: Secondary | ICD-10-CM | POA: Diagnosis not present

## 2019-12-11 DIAGNOSIS — M79641 Pain in right hand: Secondary | ICD-10-CM | POA: Diagnosis not present

## 2019-12-11 DIAGNOSIS — M65341 Trigger finger, right ring finger: Secondary | ICD-10-CM | POA: Diagnosis not present

## 2019-12-11 DIAGNOSIS — M65311 Trigger thumb, right thumb: Secondary | ICD-10-CM | POA: Diagnosis not present

## 2019-12-11 DIAGNOSIS — G5603 Carpal tunnel syndrome, bilateral upper limbs: Secondary | ICD-10-CM | POA: Diagnosis not present

## 2019-12-11 DIAGNOSIS — M1811 Unilateral primary osteoarthritis of first carpometacarpal joint, right hand: Secondary | ICD-10-CM | POA: Diagnosis not present

## 2019-12-11 DIAGNOSIS — M25541 Pain in joints of right hand: Secondary | ICD-10-CM | POA: Diagnosis not present

## 2019-12-11 DIAGNOSIS — G5601 Carpal tunnel syndrome, right upper limb: Secondary | ICD-10-CM | POA: Diagnosis not present

## 2019-12-16 DIAGNOSIS — M1811 Unilateral primary osteoarthritis of first carpometacarpal joint, right hand: Secondary | ICD-10-CM | POA: Diagnosis not present

## 2019-12-16 DIAGNOSIS — M65341 Trigger finger, right ring finger: Secondary | ICD-10-CM | POA: Diagnosis not present

## 2019-12-16 DIAGNOSIS — M25541 Pain in joints of right hand: Secondary | ICD-10-CM | POA: Diagnosis not present

## 2019-12-16 DIAGNOSIS — M79641 Pain in right hand: Secondary | ICD-10-CM | POA: Diagnosis not present

## 2019-12-16 DIAGNOSIS — M25641 Stiffness of right hand, not elsewhere classified: Secondary | ICD-10-CM | POA: Diagnosis not present

## 2019-12-16 DIAGNOSIS — M65311 Trigger thumb, right thumb: Secondary | ICD-10-CM | POA: Diagnosis not present

## 2019-12-30 DIAGNOSIS — M65311 Trigger thumb, right thumb: Secondary | ICD-10-CM | POA: Diagnosis not present

## 2019-12-30 DIAGNOSIS — G5601 Carpal tunnel syndrome, right upper limb: Secondary | ICD-10-CM | POA: Diagnosis not present

## 2019-12-30 DIAGNOSIS — M65341 Trigger finger, right ring finger: Secondary | ICD-10-CM | POA: Diagnosis not present

## 2019-12-30 DIAGNOSIS — M79641 Pain in right hand: Secondary | ICD-10-CM | POA: Diagnosis not present

## 2019-12-30 DIAGNOSIS — M25541 Pain in joints of right hand: Secondary | ICD-10-CM | POA: Diagnosis not present

## 2019-12-30 DIAGNOSIS — M25641 Stiffness of right hand, not elsewhere classified: Secondary | ICD-10-CM | POA: Diagnosis not present

## 2020-01-01 ENCOUNTER — Other Ambulatory Visit: Payer: Self-pay | Admitting: *Deleted

## 2020-01-01 ENCOUNTER — Other Ambulatory Visit: Payer: Self-pay | Admitting: Family Medicine

## 2020-01-01 MED ORDER — AMLODIPINE BESYLATE 10 MG PO TABS: 10.0000 mg | ORAL_TABLET | Freq: Every day | ORAL | 3 refills | Status: DC

## 2020-01-20 DIAGNOSIS — G5603 Carpal tunnel syndrome, bilateral upper limbs: Secondary | ICD-10-CM | POA: Diagnosis not present

## 2020-01-20 DIAGNOSIS — M1811 Unilateral primary osteoarthritis of first carpometacarpal joint, right hand: Secondary | ICD-10-CM | POA: Diagnosis not present

## 2020-02-08 ENCOUNTER — Telehealth: Payer: Self-pay | Admitting: Family Medicine

## 2020-02-08 NOTE — Progress Notes (Signed)
  Chronic Care Management   Note  02/08/2020 Name: Kelli Moss MRN: [id number] DOB: 1951-05-12  Kelli Moss is a 69 y.o. year old female who is a primary care patient of Pickard, Cammie Mcgee, MD. I reached out to Kelli Moss by phone today in response to a referral sent by Kelli Moss's PCP, [name], MD.   [name] was given information about Chronic Care Management services today including:  1. CCM service includes personalized support from designated clinical staff supervised by her physician, including individualized plan of care and coordination with other care providers 2. 24/7 contact phone numbers for assistance for urgent and routine care needs. 3. Service will only be billed when office clinical staff spend 20 minutes or more in a month to coordinate care. 4. Only one practitioner may furnish and bill the service in a calendar month. 5. The patient may stop CCM services at any time (effective at the end of the month) by phone call to the office staff.   Patient agreed to services and verbal consent obtained.   Follow up plan:  [place]

## 2020-02-23 ENCOUNTER — Other Ambulatory Visit: Payer: Self-pay

## 2020-02-25 ENCOUNTER — Ambulatory Visit: Payer: Medicare Other

## 2020-03-04 ENCOUNTER — Ambulatory Visit: Payer: Medicare Other | Admitting: Family Medicine

## 2020-03-09 ENCOUNTER — Ambulatory Visit: Payer: Medicare Other

## 2020-03-11 ENCOUNTER — Other Ambulatory Visit: Payer: Self-pay | Admitting: Family Medicine

## 2020-03-11 DIAGNOSIS — R911 Solitary pulmonary nodule: Secondary | ICD-10-CM

## 2020-03-16 ENCOUNTER — Ambulatory Visit: Payer: Medicare Other

## 2020-03-16 ENCOUNTER — Other Ambulatory Visit: Payer: Self-pay

## 2020-03-16 ENCOUNTER — Ambulatory Visit
Admission: RE | Admit: 2020-03-16 | Discharge: 2020-03-16 | Disposition: A | Discharge status: Home / Self Care | Payer: Medicare Other | Source: Ambulatory Visit | Attending: Family Medicine | Admitting: Family Medicine

## 2020-03-16 DIAGNOSIS — J984 Other disorders of lung: Secondary | ICD-10-CM | POA: Diagnosis not present

## 2020-03-16 DIAGNOSIS — R911 Solitary pulmonary nodule: Secondary | ICD-10-CM | POA: Insufficient documentation

## 2020-03-23 ENCOUNTER — Other Ambulatory Visit: Payer: Self-pay | Admitting: Family Medicine

## 2020-03-23 ENCOUNTER — Ambulatory Visit: Payer: Medicare Other

## 2020-03-31 NOTE — Chronic Care Management (AMB) (Addendum)
Chronic Care Management Pharmacy  Name: Kelli Moss  MRN: [id number] DOB: August 04, 1951  Chief Complaint/ HPI  Kelli Moss,  69 y.o. , female presents for their Initial CCM visit with the clinical pharmacist In office.  PCP : [name], MD  Their chronic conditions include: diabetes, dyslipidemia, HTN.  Office Visits: 10/29/2019 ([facility]) - complete physical Complains of trigger finger Refuses bone density test FBG was 136 so patient wants to try Trulicity to help with weight loss and sugar together, has terrible diarrhea so was discouraged from metformin. A1c returned to 6.0 just recommended weight loss did not start any medication  Consult Visit: 11/11/2019 ([facility], Ortho)  Injections in trigger finger areas, return in one month to let injections work  12/03/2019 [facility], Cardio)  Having lower leg edema, patient did not want to decrease amlodipine TG elevated, Vascepa was too expensive so patient was started on Lovaza 1g bid  Medications: Outpatient Encounter Medications as of 04/01/2020  Medication Sig   ammonium lactate (AMLACTIN) 12 % cream Apply topically as needed for dry skin.   celecoxib (CELEBREX) 200 MG capsule Take 200 mg by mouth daily.   fluticasone (FLONASE) 50 MCG/ACT nasal spray Place into both nostrils daily.   amLODipine (NORVASC) 10 MG tablet Take 1 tablet (10 mg total) by mouth daily.   aspirin 81 MG tablet Take 81 mg by mouth daily.   CINNAMON PO Take 2,000 mg by mouth daily.   cyanocobalamin (,VITAMIN B-12,) 1000 MCG/ML injection INJECT PRN   diazepam (VALIUM) 10 MG tablet TAKE 1 TABLET BY MOUTH EVERY 8 HOURS AS NEEDED FOR MUSCLE SPASM   Efinaconazole 10 % SOLN As directed for toenail fungus   Flaxseed, Linseed, (FLAX SEEDS PO) Take 1 tablet by mouth daily.   gabapentin (NEURONTIN) 300 MG capsule TAKE 1 CAPSULE BY MOUTH THREE TIMES DAILY (Patient not taking: Reported on 04/01/2020)   Grape Seed 100 MG CAPS Take by mouth.    hydrochlorothiazide (MICROZIDE) 12.5 MG capsule Take 1 capsule by mouth once daily   LORazepam (ATIVAN) 1 MG tablet Take 1 tablet (1 mg total) by mouth at bedtime as needed.   losartan (COZAAR) 100 MG tablet Take 1 tablet by mouth once daily   Magnesium 500 MG TABS Take 500 mg by mouth.   metoprolol succinate (TOPROL-XL) 50 MG 24 hr tablet Take 1 tablet (50 mg total) by mouth daily.   Misc Natural Products (BLACK CHERRY CONCENTRATE PO) Take by mouth.   omega-3 acid ethyl esters (LOVAZA) 1 g capsule Take 1 capsule (1 g total) by mouth 2 (two) times daily.   Polyethyl Glycol-Propyl Glycol (SYSTANE OP) Apply to eye.   tiZANidine (ZANAFLEX) 4 MG tablet TAKE ONE TABLET BY MOUTH EVERY 8 HOURS AS NEEDED FOR  MUSCLE  SPASMS   TURMERIC PO Take 1,000 mg by mouth.   UNABLE TO FIND Med Name: TENS UNIT DX: M54.2; M54.9   No facility-administered encounter medications on file as of 04/01/2020.     Current Diagnosis/Assessment:  Goals Addressed             This Visit's Progress    Pharmacy Care Plan:       CARE PLAN ENTRY (see longitudinal plan of care for additional care plan information)  Current Barriers:  Chronic Disease Management support, education, and care coordination needs related to Hypertension, Hyperlipidemia, and Diabetes   Hypertension BP Readings from Last 3 Encounters:  12/03/19 136/60  10/29/19 (!) 144/82  03/05/19 (!) 152/68  Pharmacist Clinical Goal(s): Over the next 180 days, patient will work with PharmD and providers to achieve BP goal <140/90 Current regimen:  Amlodipine 67m HCTZ 12.528mLosartan 10033moprol XL 29m43mily Interventions: Counseled on medication adherence Recommend HCTZ daily as prescribed Patient self care activities - Over the next 7 days, patient will: Check BP daily, document, and report to PharmD. Ensure daily salt intake < 2300 mg/day Take HCTZ daily as prescribed to help with swelling and BP.  Hyperlipidemia Lab Results  Component  Value Date/Time   LDLCALC 63 10/27/2019 08:01 AM   Pharmacist Clinical Goal(s): Over the next 180 days, patient will work with PharmD and providers to maintain LDL goal < 100, but achieve TG < 150. Current regimen:  Lovaza 1g twice daily Interventions: Provided goodrx coupon to assist with cost Discussed diet limiting saturated fats, etc. Patient self care activities - Over the next 180 days, patient will: Continue to take medication as directed Contact PharmD with any cost issues  Diabetes Lab Results  Component Value Date/Time   HGBA1C 6.0 (H) 10/27/2019 08:01 AM   HGBA1C 5.2 12/27/2016 08:09 AM   Pharmacist Clinical Goal(s): Over the next 180 days, patient will work with PharmD and providers to achieve A1c goal < 5.7. Current regimen:  No medications Interventions: Recommend diet low in carbohydrates and sugars. Patient self care activities - Over the next 180 days, patient will: Work on diet low in carbohydrates. Work on weight loss with exercise and continued dietary modifications.  Initial goal documentation         Diabetes   Recent Relevant Labs: Lab Results  Component Value Date/Time   HGBA1C 6.0 (H) 10/27/2019 08:01 AM   HGBA1C 5.2 12/27/2016 08:09 AM     Checking BG: Never  Patient is currently controlled on the following medications:  None  Last diabetic Foot exam: No results found for: HMDIABEYEEXA  Last diabetic Eye exam: No results found for: HMDIABFOOTEX   No checking blood sugar.  Working on keeping BG down with diet and exercise.  Due for recheck of A1c soon, if remains elevated may need medication at that time.    Plan  Continue control with diet and exercise, repeat A1c if remains elevated may consider addition of Metformin.  Hyperlipidemia   LDL goal < 100  Lipid Panel     Component Value Date/Time   CHOL 134 10/27/2019 0801   TRIG 354 (H) 10/27/2019 0801   HDL 28 (L) 10/27/2019 0801   LDLCALC 63 10/27/2019 0801    Hepatic  Function Latest Ref Rng & Units 10/27/2019 02/26/2019 06/05/2018  Total Protein 6.1 - 8.1 g/dL 6.6 6.9 6.9  Albumin 3.6 - 5.1 g/dL - - -  AST 10 - 35 U/L 17 17 17   ALT 6 - 29 U/L 20 20 19   Alk Phosphatase 33 - 130 U/L - - -  Total Bilirubin 0.2 - 1.2 mg/dL 0.4 0.5 0.4     The 10-year ASCVD risk score (GofMikey BussingJr., et al., 2013) is: 23.6%   Values used to calculate the score:     Age: 25 y43rs     Sex: Female     Is Non-Hispanic African American: No     Diabetic: Yes     Tobacco smoker: No     Systolic Blood Pressure: 136 003g     Is BP treated: Yes     HDL Cholesterol: 28 mg/dL     Total Cholesterol: 134 mg/dL   Patient has failed  these meds in past: Vascepa (cost) Patient is currently uncontrolled on the following medications:  Lovaza 1g bid  LDL controlled, TG's elevated.  Patient taking Lovaza but admits to not taking it like she should due to cost.  Provided GoodRx coupon for Kristopher Oppenheim to help with cost.  Counseled on importance of lowering TG's and medication adherence.  Plan  Continue current medications, contact PharmD with any cost issues in the future.  Recommend repeat lipid panel in September. Hypertension   Office blood pressures are  BP Readings from Last 3 Encounters:  12/03/19 136/60  10/29/19 (!) 144/82  03/05/19 (!) 152/68   Patient checks BP at home infrequently Patient home BP readings are ranging: no specific numbers available  Patient has failed these meds in the past: none noted Patient is currently uncontrolled on the following medications:  Amlodipine 61m HCTZ 12.582mLosartan 10055moprol XL 14m24mily  She reports sometimes her blood pressure is elevated in the 160s078Mtolic range.  She admits to breaking amlodipine in half sometimes because she is experiencing swelling.  She also admits to not taking HCTZ every day.  Counseled on HCTZ helping with swelling and to take daily as prescribed.  With this she may able to tolerate full dose of  amlodipine to control her BP regularly.  Plan  Continue current medications, take HCTZ daily, monitor BP daily for one week and report back to PharmD so we can make recommendations based on that reading.      Vaccines   Reviewed and discussed patient's vaccination history.    Immunization History  Administered Date(s) Administered   Fluad Quad(high Dose 65+) 06/11/2019   Influenza, High Dose Seasonal PF 06/12/2018   Influenza,inj,Quad PF,6+ Mos 05/10/2014   PFIZER SARS-COV-2 Vaccination 09/15/2019, 10/06/2019   Pneumococcal Conjugate-13 06/12/2018   Pneumococcal Polysaccharide-23 12/31/2016   Tdap 03/31/2009   Zoster 05/30/2011    Plan  Recommended patient receive Shingles, Tdap vaccine in office.  Medication Management   Miscellaneous medications:  Tizanidine 4mg 34mazepam1mg O26ms:  ASA 81mg F61mSeed Magnesium 500mg Tu82mic Patient currently uses [facility] Product/process development scientist #  [phone] reports using vials method to organize medications and promote adherence. Patient reports some missed doses of medication.   ChristiaBeverly Milch Clinical Pharmacist [phone] with the care management provider regarding care management and care coordination activities outlined in this encounter and have reviewed this encounter including documentation in the note and care plan. I am certifying that I agree with the content of this note and encounter as supervising physician.

## 2020-04-01 ENCOUNTER — Ambulatory Visit: Payer: Medicare Other | Admitting: Pharmacist

## 2020-04-01 ENCOUNTER — Other Ambulatory Visit: Payer: Self-pay

## 2020-04-01 DIAGNOSIS — E119 Type 2 diabetes mellitus without complications: Secondary | ICD-10-CM

## 2020-04-01 DIAGNOSIS — I10 Essential (primary) hypertension: Secondary | ICD-10-CM

## 2020-04-01 DIAGNOSIS — E785 Hyperlipidemia, unspecified: Secondary | ICD-10-CM

## 2020-04-01 NOTE — Patient Instructions (Addendum)
Visit Information Thank you for meeting with me today!  I look forward to working with you to help you meet all of your healthcare goals and answer any questions you may have.  Feel free to contact me anytime!  Goals Addressed            This Visit's Progress   . Pharmacy Care Plan:       CARE PLAN ENTRY (see longitudinal plan of care for additional care plan information)  Current Barriers:  . Chronic Disease Management support, education, and care coordination needs related to Hypertension, Hyperlipidemia, and Diabetes   Hypertension BP Readings from Last 3 Encounters:  12/03/19 136/60  10/29/19 (!) 144/82  03/05/19 (!) 152/68   . Pharmacist Clinical Goal(s): o Over the next 180 days, patient will work with PharmD and providers to achieve BP goal <140/90 . Current regimen:  . Amlodipine 10mg  . HCTZ 12.5mg  . Losartan 100mg  . Toprol XL 50mg  daily . Interventions: o Counseled on medication adherence o Recommend HCTZ daily as prescribed . Patient self care activities - Over the next 7 days, patient will: o Check BP daily, document, and report to PharmD. o Ensure daily salt intake < 2300 mg/day o Take HCTZ daily as prescribed to help with swelling and BP.  Hyperlipidemia Lab Results  Component Value Date/Time   LDLCALC 63 10/27/2019 08:01 AM   . Pharmacist Clinical Goal(s): o Over the next 180 days, patient will work with PharmD and providers to maintain LDL goal < 100, but achieve TG < 150. . Current regimen:  o Lovaza 1g twice daily . Interventions: o Provided goodrx coupon to assist with cost o Discussed diet limiting saturated fats, etc. . Patient self care activities - Over the next 180 days, patient will: o Continue to take medication as directed o Contact PharmD with any cost issues  Diabetes Lab Results  Component Value Date/Time   HGBA1C 6.0 (H) 10/27/2019 08:01 AM   HGBA1C 5.2 12/27/2016 08:09 AM   . Pharmacist Clinical Goal(s): o Over the next 180  days, patient will work with PharmD and providers to achieve A1c goal < 5.7. Marland Kitchen Current regimen:  o No medications . Interventions: o Recommend diet low in carbohydrates and sugars. . Patient self care activities - Over the next 180 days, patient will: o Work on diet low in carbohydrates. o Work on weight loss with exercise and continued dietary modifications.  Initial goal documentation        Kelli Moss was given information about Chronic Care Management services today including:  1. CCM service includes personalized support from designated clinical staff supervised by her physician, including individualized plan of care and coordination with other care providers 2. 24/7 contact phone numbers for assistance for urgent and routine care needs. 3. Standard insurance, coinsurance, copays and deductibles apply for chronic care management only during months in which we provide at least 20 minutes of these services. Most insurances cover these services at 100%, however patients may be responsible for any copay, coinsurance and/or deductible if applicable. This service may help you avoid the need for more expensive face-to-face services. 4. Only one practitioner may furnish and bill the service in a calendar month. 5. The patient may stop CCM services at any time (effective at the end of the month) by phone call to the office staff.  Patient agreed to services and verbal consent obtained.   The patient verbalized understanding of instructions provided today and agreed to receive a mailed copy of  patient instruction and/or Scientist, clinical (histocompatibility and immunogenetics).  [name], PharmD Clinical Pharmacist [facility] [phone]   Hypertension, Adult Hypertension is another name for high blood pressure. High blood pressure forces your heart to work harder to pump blood. This can cause problems over time. There are two numbers in a blood pressure reading. There is a top number (systolic)  over a bottom number (diastolic). It is best to have a blood pressure that is below 120/80. Healthy choices can help lower your blood pressure, or you may need medicine to help lower it. What are the causes? The cause of this condition is not known. Some conditions may be related to high blood pressure. What increases the risk?  Smoking.  Having type 2 diabetes mellitus, high cholesterol, or both.  Not getting enough exercise or physical activity.  Being overweight.  Having too much fat, sugar, calories, or salt (sodium) in your diet.  Drinking too much alcohol.  Having long-term (chronic) kidney disease.  Having a family history of high blood pressure.  Age. Risk increases with age.  Race. You may be at higher risk if you are African American.  Gender. Men are at higher risk than women before age 47. After age 19, women are at higher risk than men.  Having obstructive sleep apnea.  Stress. What are the signs or symptoms?  High blood pressure may not cause symptoms. Very high blood pressure (hypertensive crisis) may cause: ? Headache. ? Feelings of worry or nervousness (anxiety). ? Shortness of breath. ? Nosebleed. ? A feeling of being sick to your stomach (nausea). ? Throwing up (vomiting). ? Changes in how you see. ? Very bad chest pain. ? Seizures. How is this treated?  This condition is treated by making healthy lifestyle changes, such as: ? Eating healthy foods. ? Exercising more. ? Drinking less alcohol.  Your health care provider may prescribe medicine if lifestyle changes are not enough to get your blood pressure under control, and if: ? Your top number is above 130. ? Your bottom number is above 80.  Your personal target blood pressure may vary. Follow these instructions at home: Eating and drinking   If told, follow the DASH eating plan. To follow this plan: ? Fill one half of your plate at each meal with fruits and vegetables. ? Fill one fourth  of your plate at each meal with whole grains. Whole grains include whole-wheat pasta, brown rice, and whole-grain bread. ? Eat or drink low-fat dairy products, such as skim milk or low-fat yogurt. ? Fill one fourth of your plate at each meal with low-fat (lean) proteins. Low-fat proteins include fish, chicken without skin, eggs, beans, and tofu. ? Avoid fatty meat, cured and processed meat, or chicken with skin. ? Avoid pre-made or processed food.  Eat less than 1,500 mg of salt each day.  Do not drink alcohol if: ? Your doctor tells you not to drink. ? You are pregnant, may be pregnant, or are planning to become pregnant.  If you drink alcohol: ? Limit how much you use to:  0-1 drink a day for women.  0-2 drinks a day for men. ? Be aware of how much alcohol is in your drink. In the [place] one drink equals one 12 oz bottle of beer (355 mL), one 5 oz glass of wine (148 mL), or one 1 oz glass of hard liquor (44 mL). Lifestyle   Work with your doctor to stay at a healthy weight or to lose  weight. Ask your doctor what the best weight is for you.  Get at least 30 minutes of exercise most days of the week. This may include walking, swimming, or biking.  Get at least 30 minutes of exercise that strengthens your muscles (resistance exercise) at least 3 days a week. This may include lifting weights or doing Pilates.  Do not use any products that contain nicotine or tobacco, such as cigarettes, e-cigarettes, and chewing tobacco. If you need help quitting, ask your doctor.  Check your blood pressure at home as told by your doctor.  Keep all follow-up visits as told by your doctor. This is important. Medicines  Take over-the-counter and prescription medicines only as told by your doctor. Follow directions carefully.  Do not skip doses of blood pressure medicine. The medicine does not work as well if you skip doses. Skipping doses also puts you at risk for problems.  Ask your doctor about  side effects or reactions to medicines that you should watch for. Contact a doctor if you:  Think you are having a reaction to the medicine you are taking.  Have headaches that keep coming back (recurring).  Feel dizzy.  Have swelling in your ankles.  Have trouble with your vision. Get help right away if you:  Get a very bad headache.  Start to feel mixed up (confused).  Feel weak or numb.  Feel faint.  Have very bad pain in your: ? Chest. ? Belly (abdomen).  Throw up more than once.  Have trouble breathing. Summary  Hypertension is another name for high blood pressure.  High blood pressure forces your heart to work harder to pump blood.  For most people, a normal blood pressure is less than 120/80.  Making healthy choices can help lower blood pressure. If your blood pressure does not get lower with healthy choices, you may need to take medicine. This information is not intended to replace advice given to you by your health care provider. Make sure you discuss any questions you have with your health care provider. Document Revised: 04/16/2018 Document Reviewed: 04/16/2018 Elsevier Patient Education  2020 [employer].

## 2020-04-04 ENCOUNTER — Other Ambulatory Visit: Payer: Self-pay

## 2020-04-04 MED ORDER — CYANOCOBALAMIN 1000 MCG/ML IJ SOLN: INTRAMUSCULAR | 3 refills | Status: DC

## 2020-04-07 ENCOUNTER — Other Ambulatory Visit: Payer: Self-pay | Admitting: *Deleted

## 2020-04-07 DIAGNOSIS — G5600 Carpal tunnel syndrome, unspecified upper limb: Secondary | ICD-10-CM

## 2020-04-07 DIAGNOSIS — E785 Hyperlipidemia, unspecified: Secondary | ICD-10-CM

## 2020-04-07 DIAGNOSIS — E119 Type 2 diabetes mellitus without complications: Secondary | ICD-10-CM

## 2020-04-12 ENCOUNTER — Other Ambulatory Visit: Payer: Self-pay | Admitting: Family Medicine

## 2020-04-13 ENCOUNTER — Other Ambulatory Visit: Payer: Self-pay | Admitting: Family Medicine

## 2020-04-19 ENCOUNTER — Other Ambulatory Visit: Payer: Self-pay | Admitting: Cardiovascular Disease

## 2020-06-12 ENCOUNTER — Other Ambulatory Visit: Payer: Self-pay | Admitting: Family Medicine

## 2020-06-12 ENCOUNTER — Other Ambulatory Visit: Payer: Self-pay | Admitting: Cardiovascular Disease

## 2020-06-14 DIAGNOSIS — Z23 Encounter for immunization: Secondary | ICD-10-CM | POA: Diagnosis not present

## 2020-06-28 DIAGNOSIS — L820 Inflamed seborrheic keratosis: Secondary | ICD-10-CM | POA: Diagnosis not present

## 2020-06-28 DIAGNOSIS — B351 Tinea unguium: Secondary | ICD-10-CM | POA: Diagnosis not present

## 2020-06-28 DIAGNOSIS — L84 Corns and callosities: Secondary | ICD-10-CM | POA: Diagnosis not present

## 2020-07-11 DIAGNOSIS — Z23 Encounter for immunization: Secondary | ICD-10-CM | POA: Diagnosis not present

## 2020-07-25 ENCOUNTER — Other Ambulatory Visit: Payer: Self-pay | Admitting: Physician Assistant

## 2020-08-09 ENCOUNTER — Telehealth: Payer: Self-pay | Admitting: Cardiovascular Disease

## 2020-08-09 NOTE — Telephone Encounter (Signed)
Left message to call back  

## 2020-08-09 NOTE — Telephone Encounter (Signed)
Patient wants to know if Dr. Claiborne Billings needs her to have lab work done prior to her yearly f/u on 12/05/20. If so, please place orders in and let her know so that when she goes to see her PCP, Dr. Dennard Schaumann, he can pull those orders up and do lab work at his office when she goes to see him for her yearly f/u. She does not want to have to get lab work done twice.

## 2020-08-09 NOTE — Telephone Encounter (Signed)
Patient returning call.  Please call home phone .  

## 2020-08-09 NOTE — Telephone Encounter (Signed)
Left message

## 2020-08-15 NOTE — Telephone Encounter (Signed)
Attempted to contact patient to discuss lab work-  Patient did not answer, unable to leave message.

## 2020-08-16 NOTE — Telephone Encounter (Signed)
Unable to reach patient.   Encounter closed.

## 2020-09-05 ENCOUNTER — Telehealth: Payer: Self-pay | Admitting: Pharmacist

## 2020-09-05 NOTE — Progress Notes (Addendum)
Chronic Care Management Pharmacy Assistant   Name: Kelli Moss  MRN: [id number] DOB: [birth year]  Reason for Encounter:General Disease State Call  Patient Questions:  1.  Have you seen any other providers since your last visit? Yes.   2.  Any changes in your medicines or health? No.   PCP : Kelli Frizzle, MD   Office Visits:  Consuls:  06/28/20 Dermatology Kelli Moss. No medication changes.  Allergies:   Allergies  Allergen Reactions   Lyrica [Pregabalin]     nightmares    Medications: Outpatient Encounter Medications as of 09/05/2020  Medication Sig   amLODipine (NORVASC) 10 MG tablet Take 1 tablet (10 mg total) by mouth daily.   ammonium lactate (AMLACTIN) 12 % cream Apply topically as needed for dry skin.   aspirin 81 MG tablet Take 81 mg by mouth daily.   celecoxib (CELEBREX) 200 MG capsule Take 200 mg by mouth daily.   CINNAMON PO Take 2,000 mg by mouth daily.   cyanocobalamin (,VITAMIN B-12,) 1000 MCG/ML injection INJECT 1 ML IN THE MUSCLE ONCE MONTHLY   diazepam (VALIUM) 10 MG tablet TAKE 1 TABLET BY MOUTH EVERY 8 HOURS AS NEEDED FOR MUSCLE SPASM   Efinaconazole 10 % SOLN As directed for toenail fungus   Flaxseed, Linseed, (FLAX SEEDS PO) Take 1 tablet by mouth daily.   fluticasone (FLONASE) 50 MCG/ACT nasal spray Place into both nostrils daily.   gabapentin (NEURONTIN) 300 MG capsule TAKE 1 CAPSULE BY MOUTH THREE TIMES DAILY (Patient not taking: Reported on 04/01/2020)   Grape Seed 100 MG CAPS Take by mouth.   hydrochlorothiazide (MICROZIDE) 12.5 MG capsule Take 1 capsule by mouth once daily   LORazepam (ATIVAN) 1 MG tablet Take 1 tablet (1 mg total) by mouth at bedtime as needed.   losartan (COZAAR) 100 MG tablet Take 1 tablet by mouth once daily   Magnesium 500 MG TABS Take 500 mg by mouth.   metoprolol succinate (TOPROL-XL) 50 MG 24 hr tablet Take 1 tablet by mouth once daily   Misc Natural Products (BLACK CHERRY CONCENTRATE PO) Take by mouth.    omega-3 acid ethyl esters (LOVAZA) 1 g capsule TAKE ONE CAPSULE BY MOUTH TWICE A DAY   Polyethyl Glycol-Propyl Glycol (SYSTANE OP) Apply to eye.   tiZANidine (ZANAFLEX) 4 MG tablet TAKE ONE TABLET BY MOUTH EVERY 8 HOURS AS NEEDED FOR  MUSCLE  SPASMS   TURMERIC PO Take 1,000 mg by mouth.   UNABLE TO FIND Med Name: TENS UNIT DX: M54.2; M54.9   No facility-administered encounter medications on file as of 09/05/2020.    Current Diagnosis: Patient Active Problem List   Diagnosis Date Noted   Diabetes mellitus without complication ([place])    Dyslipidemia    Neuromuscular disorder ([place])    CTS (carpal tunnel syndrome)     Goals Addressed   None    Patient stated overall she has been feeling well.Patient stated she is not doing any particular diet, she could and should be eating better. Patient stated that her back, hip, and neck is bad and causes pain so she only does daily duties in the house. Patient stated her main concern is her Vitamin B12 for some reason the pharmacy is only giving her 1 cc per month and she wants it go back to 30 cc bottle monthly. The 1 cc a month is costing her too much money.Patient  stated her feet swells still depending on what she eats and what she does  that day.  Follow-Up:  Pharmacist Review   Kelli Moss, RMA Clinical Pharmacist Assistant [phone]   5 minutes spent in review, coordination, and documentation.  Reviewed by: Kelli Moss, PharmD Clinical Pharmacist [facility] [phone]

## 2020-10-06 DIAGNOSIS — H04123 Dry eye syndrome of bilateral lacrimal glands: Secondary | ICD-10-CM | POA: Diagnosis not present

## 2020-10-27 ENCOUNTER — Other Ambulatory Visit: Payer: Self-pay

## 2020-10-27 ENCOUNTER — Other Ambulatory Visit: Payer: Medicare Other

## 2020-10-27 DIAGNOSIS — E119 Type 2 diabetes mellitus without complications: Secondary | ICD-10-CM

## 2020-10-27 DIAGNOSIS — Z13220 Encounter for screening for lipoid disorders: Secondary | ICD-10-CM | POA: Diagnosis not present

## 2020-10-27 DIAGNOSIS — Z136 Encounter for screening for cardiovascular disorders: Secondary | ICD-10-CM

## 2020-10-28 LAB — CBC WITH DIFFERENTIAL/PLATELET
Absolute Monocytes: 504 cells/uL (ref 200–950)
Basophils Absolute: 62 cells/uL (ref 0–200)
Basophils Relative: 0.9 %
Eosinophils Absolute: 200 cells/uL (ref 15–500)
Eosinophils Relative: 2.9 %
HCT: 39.4 % (ref 35.0–45.0)
Hemoglobin: 13.2 g/dL (ref 11.7–15.5)
Lymphs Abs: 1780 cells/uL (ref 850–3900)
MCH: 28.3 pg (ref 27.0–33.0)
MCHC: 33.5 g/dL (ref 32.0–36.0)
MCV: 84.4 fL (ref 80.0–100.0)
MPV: 10.2 fL (ref 7.5–12.5)
Monocytes Relative: 7.3 %
Neutro Abs: 4354 cells/uL (ref 1500–7800)
Neutrophils Relative %: 63.1 %
Platelets: 215 10*3/uL (ref 140–400)
RBC: 4.67 10*6/uL (ref 3.80–5.10)
RDW: 14.1 % (ref 11.0–15.0)
Total Lymphocyte: 25.8 %
WBC: 6.9 10*3/uL (ref 3.8–10.8)

## 2020-10-28 LAB — HEMOGLOBIN A1C
Hgb A1c MFr Bld: 6 % of total Hgb — ABNORMAL HIGH (ref <5.7)
Mean Plasma Glucose: 126 mg/dL
eAG (mmol/L): 7 mmol/L

## 2020-10-28 LAB — COMPLETE METABOLIC PANEL WITH GFR
AG Ratio: 1.6 (calc) (ref 1.0–2.5)
ALT: 17 U/L (ref 6–29)
AST: 15 U/L (ref 10–35)
Albumin: 4 g/dL (ref 3.6–5.1)
Alkaline phosphatase (APISO): 58 U/L (ref 37–153)
BUN: 15 mg/dL (ref 7–25)
CO2: 24 mmol/L (ref 20–32)
Calcium: 8.6 mg/dL (ref 8.6–10.4)
Chloride: 108 mmol/L (ref 98–110)
Creat: 0.54 mg/dL (ref 0.50–0.99)
GFR, Est African American: 112 mL/min/{1.73_m2} (ref >=60)
GFR, Est Non African American: 96 mL/min/{1.73_m2} (ref >=60)
Globulin: 2.5 g/dL (calc) (ref 1.9–3.7)
Glucose, Bld: 124 mg/dL — ABNORMAL HIGH (ref 65–99)
Potassium: 4 mmol/L (ref 3.5–5.3)
Sodium: 142 mmol/L (ref 135–146)
Total Bilirubin: 0.5 mg/dL (ref 0.2–1.2)
Total Protein: 6.5 g/dL (ref 6.1–8.1)

## 2020-10-28 LAB — LIPID PANEL
Cholesterol: 135 mg/dL (ref <200)
HDL: 31 mg/dL — ABNORMAL LOW (ref >=50)
LDL Cholesterol (Calc): 73 mg/dL (calc)
Non-HDL Cholesterol (Calc): 104 mg/dL (calc) (ref <130)
Total CHOL/HDL Ratio: 4.4 (calc) (ref <5.0)
Triglycerides: 223 mg/dL — ABNORMAL HIGH (ref <150)

## 2020-10-28 LAB — MICROALBUMIN / CREATININE URINE RATIO
Creatinine, Urine: 145 mg/dL (ref 20–275)
Microalb Creat Ratio: 10 mcg/mg creat (ref <30)
Microalb, Ur: 1.5 mg/dL

## 2020-11-02 ENCOUNTER — Ambulatory Visit (INDEPENDENT_AMBULATORY_CARE_PROVIDER_SITE_OTHER): Payer: Medicare Other | Admitting: Family Medicine

## 2020-11-02 ENCOUNTER — Other Ambulatory Visit: Payer: Self-pay

## 2020-11-02 ENCOUNTER — Encounter: Payer: Medicare Other | Admitting: Family Medicine

## 2020-11-02 ENCOUNTER — Encounter: Payer: Self-pay | Admitting: Family Medicine

## 2020-11-02 VITALS — BP 126/82 | HR 90 | Temp 98.3°F | Resp 16 | Ht 67.0 in | Wt 279.0 lb

## 2020-11-02 DIAGNOSIS — E785 Hyperlipidemia, unspecified: Secondary | ICD-10-CM

## 2020-11-02 DIAGNOSIS — E8881 Metabolic syndrome: Secondary | ICD-10-CM

## 2020-11-02 DIAGNOSIS — Z0000 Encounter for general adult medical examination without abnormal findings: Secondary | ICD-10-CM

## 2020-11-02 DIAGNOSIS — Z0001 Encounter for general adult medical examination with abnormal findings: Secondary | ICD-10-CM

## 2020-11-02 DIAGNOSIS — I10 Essential (primary) hypertension: Secondary | ICD-10-CM | POA: Diagnosis not present

## 2020-11-02 DIAGNOSIS — R911 Solitary pulmonary nodule: Secondary | ICD-10-CM | POA: Diagnosis not present

## 2020-11-02 DIAGNOSIS — E119 Type 2 diabetes mellitus without complications: Secondary | ICD-10-CM | POA: Diagnosis not present

## 2020-11-02 MED ORDER — TIZANIDINE HCL 4 MG PO TABS: ORAL_TABLET | ORAL | 2 refills | Status: DC

## 2020-11-02 MED ORDER — DIAZEPAM 10 MG PO TABS: ORAL_TABLET | ORAL | 0 refills | Status: DC

## 2020-11-02 NOTE — Progress Notes (Signed)
Subjective:    Patient ID: Kelli Moss, female    DOB: 1951-04-13, 70 y.o.   MRN: [id number]  HPI  Patient is here today for complete physical exam.  Her last mammogram was in March of last year.  She is due for that.  She does not require a Pap smear.  She is overdue for a colonoscopy however she is afraid to schedule it during Covid.  Unfortunately she reports hematochezia.  She has been waiting for COVID to pass before she schedules the colonoscopy and EGD that her gastroenterologist recommended.  I strongly urged her to schedule the colonoscopy particular given the hematochezia due to her history of colon polyps.  Her immunizations are up-to-date except for Shingrix and the tetanus shot.  I recommended both.  She denies any falls, depression, or memory loss.  She continues to have severe neuropathic pain in both feet due to B12 deficiency.  She is currently receiving B12 1000 mcg/month IM.  She is requesting a refill on her tizanidine as well as her Valium that she uses sparingly.  Last year she was found to have a new 3 mm nodule in the right lower lobe.  Given her smoking history she would be considered high risk and therefore she is due to repeat the CT scan of her lung without contrast in July of this year. CT chest in 7/21- IMPRESSION: 1. 3 mm superior segment right lower lobe nodule is new from prior exams. No follow-up needed if patient is low-risk. Non-contrast chest CT can be considered in 12 months if patient is high-risk. This recommendation follows the consensus statement: Guidelines for Management of Incidental Pulmonary Nodules Detected on CT Images: From the Fleischner Society 2017; Radiology 2017; 284:228-243. 2. Additional bilateral pulmonary nodules are unchanged and considered benign. 3. Aortic atherosclerosis (ICD10-I70.0). Coronary artery Calcification. Mammogram 3/21-normal Immunization History  Administered Date(s) Administered  . Fluad Quad(high Dose 65+)  06/11/2019, 07/11/2020  . Influenza, High Dose Seasonal PF 06/12/2018  . Influenza,inj,Quad PF,6+ Mos 05/10/2014  . PFIZER(Purple Top)SARS-COV-2 Vaccination 09/15/2019, 10/06/2019, 06/14/2020  . Pneumococcal Conjugate-13 06/12/2018  . Pneumococcal Polysaccharide-23 12/31/2016  . Tdap 03/31/2009  . Zoster 05/30/2011      Past Medical History:  Diagnosis Date  . Colon polyp   . CTS (carpal tunnel syndrome)   . Depression   . Diabetes mellitus without complication (HCC)    prediabetes  . Dyslipidemia   . Gastritis   . Hypertension   . Neuromuscular disorder ([place])    peripheral neuropathy   No past surgical history on file. Current Outpatient Medications on File Prior to Visit  Medication Sig Dispense Refill  . amLODipine (NORVASC) 10 MG tablet Take 1 tablet (10 mg total) by mouth daily. 90 tablet 3  . ammonium lactate (AMLACTIN) 12 % cream Apply topically as needed for dry skin.    Marland Kitchen aspirin 81 MG tablet Take 81 mg by mouth daily.    . celecoxib (CELEBREX) 200 MG capsule Take 200 mg by mouth daily.    Marland Kitchen CINNAMON PO Take 2,000 mg by mouth daily.    . cyanocobalamin (,VITAMIN B-12,) 1000 MCG/ML injection INJECT 1 ML IN THE MUSCLE ONCE MONTHLY 30 mL 3  . diazepam (VALIUM) 10 MG tablet TAKE 1 TABLET BY MOUTH EVERY 8 HOURS AS NEEDED FOR MUSCLE SPASM 60 tablet 0  . Efinaconazole 10 % SOLN As directed for toenail fungus 8 mL 3  . Flaxseed, Linseed, (FLAX SEEDS PO) Take 1 tablet by mouth daily.    Marland Kitchen  fluticasone (FLONASE) 50 MCG/ACT nasal spray Place into both nostrils daily.    Marland Kitchen gabapentin (NEURONTIN) 300 MG capsule TAKE 1 CAPSULE BY MOUTH THREE TIMES DAILY (Patient not taking: Reported on 04/01/2020) 270 capsule 0  . Grape Seed 100 MG CAPS Take by mouth.    . hydrochlorothiazide (MICROZIDE) 12.5 MG capsule Take 1 capsule by mouth once daily 90 capsule 2  . LORazepam (ATIVAN) 1 MG tablet Take 1 tablet (1 mg total) by mouth at bedtime as needed. 90 tablet 0  . losartan (COZAAR) 100 MG  tablet Take 1 tablet by mouth once daily 90 tablet 1  . Magnesium 500 MG TABS Take 500 mg by mouth.    . metoprolol succinate (TOPROL-XL) 50 MG 24 hr tablet Take 1 tablet by mouth once daily 90 tablet 2  . Misc Natural Products (BLACK CHERRY CONCENTRATE PO) Take by mouth.    . omega-3 acid ethyl esters (LOVAZA) 1 g capsule TAKE ONE CAPSULE BY MOUTH TWICE A DAY 180 capsule 1  . Polyethyl Glycol-Propyl Glycol (SYSTANE OP) Apply to eye.    Marland Kitchen tiZANidine (ZANAFLEX) 4 MG tablet TAKE ONE TABLET BY MOUTH EVERY 8 HOURS AS NEEDED FOR  MUSCLE  SPASMS 90 tablet 2  . TURMERIC PO Take 1,000 mg by mouth.    Marland Kitchen UNABLE TO FIND Med Name: TENS UNIT DX: M54.2; M54.9 1 Units 0   No current facility-administered medications on file prior to visit.   Allergies  Allergen Reactions  . Lyrica [Pregabalin]     nightmares   Social History   Socioeconomic History  . Marital status: Married    Spouse name: Not on file  . Number of children: Not on file  . Years of education: Not on file  . Highest education level: Not on file  Occupational History  . Not on file  Tobacco Use  . Smoking status: Former Smoker    Quit date: 01/23/1988    Years since quitting: 32.8  . Smokeless tobacco: Never Used  Substance and Sexual Activity  . Alcohol use: No  . Drug use: No  . Sexual activity: Not on file  Other Topics Concern  . Not on file  Social History Narrative  . Not on file   Social Determinants of Health   Financial Resource Strain: Low Risk   . Difficulty of Paying Living Expenses: Not very hard  Food Insecurity: Not on file  Transportation Needs: Not on file  Physical Activity: Not on file  Stress: Not on file  Social Connections: Not on file  Intimate Partner Violence: Not on file   No family history on file.    Review of Systems  All other systems reviewed and are negative.      Objective:   Physical Exam Vitals reviewed.  Constitutional:      General: She is not in acute distress.     Appearance: She is well-developed. She is not diaphoretic.  HENT:     Head: Normocephalic and atraumatic.     Right Ear: External ear normal.     Left Ear: External ear normal.     Nose: Nose normal.     Mouth/Throat:     Pharynx: No oropharyngeal exudate.  Eyes:     General: No scleral icterus.    Conjunctiva/sclera: Conjunctivae normal.     Pupils: Pupils are equal, round, and reactive to light.  Neck:     Thyroid: No thyromegaly.     Vascular: No JVD.  Trachea: No tracheal deviation.  Cardiovascular:     Rate and Rhythm: Normal rate and regular rhythm.     Heart sounds: Normal heart sounds. No murmur heard. No friction rub. No gallop.   Pulmonary:     Effort: Pulmonary effort is normal. No respiratory distress.     Breath sounds: Normal breath sounds. No stridor. No wheezing or rales.  Chest:     Chest wall: No tenderness.  Abdominal:     General: Bowel sounds are normal. There is no distension.     Palpations: Abdomen is soft. There is no mass.     Tenderness: There is no abdominal tenderness. There is no guarding or rebound.  Musculoskeletal:        General: No tenderness. Normal range of motion.     Cervical back: Normal range of motion and neck supple.  Lymphadenopathy:     Cervical: No cervical adenopathy.  Skin:    General: Skin is warm.     Coloration: Skin is not pale.     Findings: No erythema or rash.  Neurological:     Mental Status: She is alert and oriented to person, place, and time.     Cranial Nerves: No cranial nerve deficit.     Motor: No abnormal muscle tone.     Coordination: Coordination normal.     Deep Tendon Reflexes: Reflexes are normal and symmetric.  Psychiatric:        Mood and Affect: Mood is anxious. Affect is labile.        Behavior: Behavior normal.        Thought Content: Thought content normal.        Judgment: Judgment normal.    Lab on 10/27/2020  Component Date Value Ref Range Status  . WBC 10/27/2020 6.9  3.8 - 10.8  Thousand/uL Final  . RBC 10/27/2020 4.67  3.80 - 5.10 Million/uL Final  . Hemoglobin 10/27/2020 13.2  11.7 - 15.5 g/dL Final  . HCT 10/27/2020 39.4  35.0 - 45.0 % Final  . MCV 10/27/2020 84.4  80.0 - 100.0 fL Final  . MCH 10/27/2020 28.3  27.0 - 33.0 pg Final  . MCHC 10/27/2020 33.5  32.0 - 36.0 g/dL Final  . RDW 10/27/2020 14.1  11.0 - 15.0 % Final  . Platelets 10/27/2020 215  140 - 400 Thousand/uL Final  . MPV 10/27/2020 10.2  7.5 - 12.5 fL Final  . Neutro Abs 10/27/2020 4,354  1,500 - 7,800 cells/uL Final  . Lymphs Abs 10/27/2020 1,780  850 - 3,900 cells/uL Final  . Absolute Monocytes 10/27/2020 504  200 - 950 cells/uL Final  . Eosinophils Absolute 10/27/2020 200  15 - 500 cells/uL Final  . Basophils Absolute 10/27/2020 62  0 - 200 cells/uL Final  . Neutrophils Relative % 10/27/2020 63.1  % Final  . Total Lymphocyte 10/27/2020 25.8  % Final  . Monocytes Relative 10/27/2020 7.3  % Final  . Eosinophils Relative 10/27/2020 2.9  % Final  . Basophils Relative 10/27/2020 0.9  % Final  . Glucose, Bld 10/27/2020 124* 65 - 99 mg/dL Final   Comment: .            Fasting reference interval . For someone without known diabetes, a glucose value between 100 and 125 mg/dL is consistent with prediabetes and should be confirmed with a follow-up test. .   . BUN 10/27/2020 15  7 - 25 mg/dL Final  . Creat 10/27/2020 0.54  0.50 - 0.99 mg/dL Final  Comment: For patients >51 years of age, the reference limit for Creatinine is approximately 13% higher for people identified as African-American. .   . GFR, Est Non African American 10/27/2020 96  > OR = 60 mL/min/1.83m2 Final  . GFR, Est African American 10/27/2020 112  > OR = 60 mL/min/1.67m2 Final  . BUN/Creatinine Ratio 27/10/5007 NOT APPLICABLE  6 - 22 (calc) Final  . Sodium 10/27/2020 142  135 - 146 mmol/L Final  . Potassium 10/27/2020 4.0  3.5 - 5.3 mmol/L Final  . Chloride 10/27/2020 108  98 - 110 mmol/L Final  . CO2 10/27/2020 24  20 - 32  mmol/L Final  . Calcium 10/27/2020 8.6  8.6 - 10.4 mg/dL Final  . Total Protein 10/27/2020 6.5  6.1 - 8.1 g/dL Final  . Albumin 10/27/2020 4.0  3.6 - 5.1 g/dL Final  . Globulin 10/27/2020 2.5  1.9 - 3.7 g/dL (calc) Final  . AG Ratio 10/27/2020 1.6  1.0 - 2.5 (calc) Final  . Total Bilirubin 10/27/2020 0.5  0.2 - 1.2 mg/dL Final  . Alkaline phosphatase (APISO) 10/27/2020 58  37 - 153 U/L Final  . AST 10/27/2020 15  10 - 35 U/L Final  . ALT 10/27/2020 17  6 - 29 U/L Final  . Hgb A1c MFr Bld 10/27/2020 6.0* <5.7 % of total Hgb Final   Comment: For someone without known diabetes, a hemoglobin  A1c value between 5.7% and 6.4% is consistent with prediabetes and should be confirmed with a  follow-up test. . For someone with known diabetes, a value <7% indicates that their diabetes is well controlled. A1c targets should be individualized based on duration of diabetes, age, comorbid conditions, and other considerations. . This assay result is consistent with an increased risk of diabetes. . Currently, no consensus exists regarding use of hemoglobin A1c for diagnosis of diabetes for children. .   . Mean Plasma Glucose 10/27/2020 126  mg/dL Final  . eAG (mmol/L) 10/27/2020 7.0  mmol/L Final  . Cholesterol 10/27/2020 135  <200 mg/dL Final  . HDL 10/27/2020 31* > OR = 50 mg/dL Final  . Triglycerides 10/27/2020 223* <150 mg/dL Final   Comment: . If a non-fasting specimen was collected, consider repeat triglyceride testing on a fasting specimen if clinically indicated.  Yates Decamp et al. J. of Clin. Lipidol. 3818;2:993-716. .   . LDL Cholesterol (Calc) 10/27/2020 73  mg/dL (calc) Final   Comment: Reference range: <100 . Desirable range <100 mg/dL for primary prevention;   <70 mg/dL for patients with CHD or diabetic patients  with > or = 2 CHD risk factors. Marland Kitchen LDL-C is now calculated using the Martin-Hopkins  calculation, which is a validated novel method providing  better accuracy than  the Friedewald equation in the  estimation of LDL-C.  Cresenciano Genre et al. Annamaria Helling. 9678;938(10): 2061-2068  (http://education.[facility].com/faq/FAQ164)   . Total CHOL/HDL Ratio 10/27/2020 4.4  <5.0 (calc) Final  . Non-HDL Cholesterol (Calc) 10/27/2020 104  <130 mg/dL (calc) Final   Comment: For patients with diabetes plus 1 major ASCVD risk  factor, treating to a non-HDL-C goal of <100 mg/dL  (LDL-C of <70 mg/dL) is considered a therapeutic  option.   . Creatinine, Urine 10/27/2020 145  20 - 275 mg/dL Final  . Microalb, Ur 10/27/2020 1.5  mg/dL Final   Comment: Reference Range Not established   . Microalb Creat Ratio 10/27/2020 10  <30 mcg/mg creat Final   Comment: . The ADA defines abnormalities in albumin excretion as follows: .  Albuminuria Category        Result (mcg/mg creatinine) . Normal to Mildly increased   <30 Moderately increased         30-299  Severely increased           > OR = 300 . The ADA recommends that at least two of three specimens collected within a 3-6 month period be abnormal before considering a patient to be within a diagnostic category.            Assessment & Plan:  General medical exam  Diabetes mellitus without complication ([place])  Benign essential HTN  Lung nodule seen on imaging study - Plan: CT CHEST NODULE FOLLOW UP LOW DOSE W/O  Metabolic syndrome  Dyslipidemia  A1c is acceptable at 6.0.  LDL cholesterol is well below 100.  Triglycerides have improved on fish oil.  I congratulated the patient on this.  Blood pressure is excellent.  I will schedule the patient for a CT scan of the chest without contrast to follow-up the 3 mm lung nodule seen in the right lung.  Encourage exercise and weight loss.  Recommended she schedule a mammogram in March and also her colonoscopy at her earliest convenience.  Recommended the Shingrix.

## 2020-12-05 ENCOUNTER — Ambulatory Visit (INDEPENDENT_AMBULATORY_CARE_PROVIDER_SITE_OTHER): Payer: Medicare Other | Admitting: Cardiovascular Disease

## 2020-12-05 ENCOUNTER — Other Ambulatory Visit: Payer: Self-pay

## 2020-12-05 ENCOUNTER — Encounter: Payer: Self-pay | Admitting: Cardiovascular Disease

## 2020-12-05 VITALS — BP 138/64 | HR 81 | Ht 67.0 in | Wt 280.8 lb

## 2020-12-05 DIAGNOSIS — R600 Localized edema: Secondary | ICD-10-CM

## 2020-12-05 DIAGNOSIS — G4733 Obstructive sleep apnea (adult) (pediatric): Secondary | ICD-10-CM

## 2020-12-05 DIAGNOSIS — R911 Solitary pulmonary nodule: Secondary | ICD-10-CM

## 2020-12-05 DIAGNOSIS — I10 Essential (primary) hypertension: Secondary | ICD-10-CM | POA: Diagnosis not present

## 2020-12-05 DIAGNOSIS — E785 Hyperlipidemia, unspecified: Secondary | ICD-10-CM

## 2020-12-05 MED ORDER — AMLODIPINE BESYLATE 10 MG PO TABS: 10.0000 mg | ORAL_TABLET | Freq: Every day | ORAL | 3 refills | Status: DC

## 2020-12-05 MED ORDER — OMEGA-3-ACID ETHYL ESTERS 1 G PO CAPS: 1.0000 | ORAL_CAPSULE | Freq: Two times a day (BID) | ORAL | 3 refills | Status: DC

## 2020-12-05 MED ORDER — LOSARTAN POTASSIUM 100 MG PO TABS: 1.0000 | ORAL_TABLET | Freq: Every day | ORAL | 3 refills | Status: DC

## 2020-12-05 MED ORDER — METOPROLOL SUCCINATE ER 50 MG PO TB24: 50.0000 mg | ORAL_TABLET | Freq: Every day | ORAL | 3 refills | Status: DC

## 2020-12-05 MED ORDER — HYDROCHLOROTHIAZIDE 12.5 MG PO CAPS: 12.5000 mg | ORAL_CAPSULE | Freq: Every day | ORAL | 3 refills | Status: DC

## 2020-12-05 NOTE — Progress Notes (Signed)
Cardiology Office Note    Date:  12/07/2020   ID:  Kelli, Moss 16-Apr-1951, MRN [id number]  PCP:  [name], MD  Cardiologist:  Shelva Majestic, MD    F/U cardiology evaluation initially referred through the courtesy of Dr. Jenna Luo for evaluation of chest pain.  History of Present Illness:  Kelli Moss is a 70 y.o. female who was initially referred to [facility] the courtesy of Dr. Dennard Schaumann for evaluation of grabbing chest discomfort.  She is the sister of my patient Mr. Kelli Moss.  I saw her for my initial evaluation in December 2019.  I have not seen her since.  She presents for follow-up evaluation.  Ms. Iott has a longstanding history of hypertension for at least 20 years and is also felt to have metabolic syndrome.  Has a history of hiatal hernia and celiac disease followed by Dr. Collene Mares as well as a history of pernicious anemia and peripheral neuropathy.  She also has a longstanding history of obstructive sleep apnea and has been on CPAP therapy for over 20 years.  She had been evaluated by Dr. Dennard Schaumann and during her evaluation she admitted to experiencing episodes of a grabbing-like chest discomfort which is nonexertional but may radiate to her jaws.  She admits to having some anxiety.  She also has had issues with lower extremity edema.  She is extremely sedentary.  She has additional risk factors including a brother with a history of CAD.  She has a remote history of tobacco.  When I initially saw her on July 31, 2018 she had significant tense lower extremity edema to her thighs and had been on amlodipine 10 mg and losartan 100 mg for hypertension.  I recommended she decrease amlodipine to 5 mg and with a resting pulse of 95 added beta-blocker therapy with Toprol-XL 25 mg for 2 weeks with titration up to 50 mg.  I also added HCTZ 12.5 mg to help with her edema.  I recommended she undergo a 2D echo Doppler study as well as a coronary CTA to assess for  subclinical atherosclerosis.  She had a significant history of mixed hyperlipidemia and laboratory in October 2019 showed triglycerides of 350, and VLDL of 31.  I recommended the addition of Vascepa 2 capsules twice a day I discussed the importance of weight loss.  She was continuing to use CPAP and is followed by [facility] neurology.  Since I saw her, she was evaluated by Almyra Deforest, [name].  Her coronary calcium score was 23 which placed her in the 62nd percentile for age and sex matched control.  She was found to have a 6 mm right middle lobe nodule and on repeat evaluation in July 2020 this was unchanged.  There was diffuse dilation of her esophagus suggestive of esophagitis and dysmotility disorder.  She tells me that she is in need to have another CT since another lung nodule was found.  Presently she denies any anginal-like symptoms.  She admits to a "chest cramping "sensation when she gets upset.  She asked me questions today concerning heart healthy diet and was wondering about the keto diet.  She admits to 100% compliance with CPAP.  She presents for evaluation.  Past Medical History:  Diagnosis Date  . Colon polyp   . CTS (carpal tunnel syndrome)   . Depression   . Diabetes mellitus without complication (HCC)    prediabetes  . Dyslipidemia   . Gastritis   .  Hypertension   . Neuromuscular disorder ([place])    peripheral neuropathy    No past surgical history on file.  Current Medications: Outpatient Medications Prior to Visit  Medication Sig Dispense Refill  . ammonium lactate (AMLACTIN) 12 % cream Apply topically as needed for dry skin.    Marland Kitchen aspirin 81 MG tablet Take 81 mg by mouth daily.    Marland Kitchen CINNAMON PO Take 2,000 mg by mouth daily.    . cyanocobalamin (,VITAMIN B-12,) 1000 MCG/ML injection INJECT 1 ML IN THE MUSCLE ONCE MONTHLY 30 mL 3  . diazepam (VALIUM) 10 MG tablet TAKE 1 TABLET BY MOUTH EVERY 8 HOURS AS NEEDED FOR MUSCLE SPASM 60 tablet 0  . Efinaconazole 10 %  SOLN As directed for toenail fungus 8 mL 3  . Flaxseed, Linseed, (FLAX SEEDS PO) Take 1 tablet by mouth daily.    . fluticasone (FLONASE) 50 MCG/ACT nasal spray Place into both nostrils daily.    . Grape Seed 100 MG CAPS Take by mouth.    Marland Kitchen LORazepam (ATIVAN) 1 MG tablet Take 1 tablet (1 mg total) by mouth at bedtime as needed. 90 tablet 0  . Magnesium 500 MG TABS Take 500 mg by mouth.    . Misc Natural Products (BLACK CHERRY CONCENTRATE PO) Take by mouth.    Vladimir Faster Glycol-Propyl Glycol (SYSTANE OP) Apply to eye.    Marland Kitchen tiZANidine (ZANAFLEX) 4 MG tablet TAKE ONE TABLET BY MOUTH EVERY 8 HOURS AS NEEDED FOR  MUSCLE  SPASMS 90 tablet 2  . TURMERIC PO Take 1,000 mg by mouth.    Marland Kitchen UNABLE TO FIND Med Name: TENS UNIT DX: M54.2; M54.9 1 Units 0  . amLODipine (NORVASC) 10 MG tablet Take 1 tablet (10 mg total) by mouth daily. 90 tablet 3  . hydrochlorothiazide (MICROZIDE) 12.5 MG capsule Take 1 capsule by mouth once daily 90 capsule 2  . losartan (COZAAR) 100 MG tablet Take 1 tablet by mouth once daily 90 tablet 1  . metoprolol succinate (TOPROL-XL) 50 MG 24 hr tablet Take 1 tablet by mouth once daily 90 tablet 2  . omega-3 acid ethyl esters (LOVAZA) 1 g capsule TAKE ONE CAPSULE BY MOUTH TWICE A DAY 180 capsule 1   No facility-administered medications prior to visit.     Allergies:   Lyrica [pregabalin]   Social History   Socioeconomic History  . Marital status: Married    Spouse name: Not on file  . Number of children: Not on file  . Years of education: Not on file  . Highest education level: Not on file  Occupational History  . Not on file  Tobacco Use  . Smoking status: Former Smoker    Quit date: 01/23/1988    Years since quitting: 32.8  . Smokeless tobacco: Never Used  Substance and Sexual Activity  . Alcohol use: No  . Drug use: No  . Sexual activity: Not on file  Other Topics Concern  . Not on file  Social History Narrative  . Not on file   Social Determinants of Health    Financial Resource Strain: Low Risk   . Difficulty of Paying Living Expenses: Not very hard  Food Insecurity: Not on file  Transportation Needs: Not on file  Physical Activity: Not on file  Stress: Not on file  Social Connections: Not on file     Family History:  The patient's family history is not on file.  Her mother died at age 64 with pancreatic cancer.  Father died at age 78 with bladder cancer.  She has a 77 year old brother who has hypertension and CAD who is my patient Kelli Moss.)  ROS General: Negative; No fevers, chills, or night sweats; obesity HEENT: Negative; No changes in vision or hearing, sinus congestion, difficulty swallowing Pulmonary: Negative; No cough, wheezing, shortness of breath, hemoptysis Cardiovascular: See HPI GI: Negative; No nausea, vomiting, diarrhea, or abdominal pain GU: Negative; No dysuria, hematuria, or difficulty voiding Musculoskeletal: Negative; no myalgias, joint pain, or weakness Hematologic/Oncology: Negative; no easy bruising, bleeding Endocrine: metabolic syndrome Neuro: Negative; no changes in balance, headaches Skin: Negative; No rashes or skin lesions Psychiatric: Negative; No behavioral problems, depression Sleep: OSA on CPAP for over 20 years; No redness of breakthrough snoring, daytime sleepiness, hypersomnolence, bruxism, restless legs, hypnogognic hallucinations, no cataplexy Other comprehensive 14 point system review is negative.   PHYSICAL EXAM:   VS:  BP 138/64 (BP Location: Left Arm, Patient Position: Sitting, Cuff Size: Large)   Pulse 81   Ht 5' 7"  (1.702 m)   Wt 280 lb 12.8 oz (127.4 kg)   SpO2 97%   BMI 43.98 kg/m     Repeat blood pressure by me was 134/64  Wt Readings from Last 3 Encounters:  12/05/20 280 lb 12.8 oz (127.4 kg)  11/02/20 279 lb (126.6 kg)  12/03/19 284 lb (128.8 kg)    General: Alert, oriented, no distress.  Morbidly obese Skin: normal turgor, no rashes, warm and dry HEENT:  Normocephalic, atraumatic. Pupils equal round and reactive to light; sclera anicteric; extraocular muscles intact;  Nose without nasal septal hypertrophy Mouth/Parynx benign; Mallinpatti scale 3 Neck: No JVD, no carotid bruits; normal carotid upstroke Lungs: clear to ausculatation and percussion; no wheezing or rales Chest wall: without tenderness to palpitation Heart: PMI not displaced, RRR, s1 s2 normal, 1/6 systolic murmur, no diastolic murmur, no rubs, gallops, thrills, or heaves Abdomen: soft, nontender; no hepatosplenomehaly, BS+; abdominal aorta nontender and not dilated by palpation. Back: no CVA tenderness Pulses 2+ Musculoskeletal: full range of motion, normal strength, no joint deformities Extremities: Mild left leg tenderness: No palpable cord.  No clubbing cyanosis or edema, Homan's sign negative  Neurologic: grossly nonfocal; Cranial nerves grossly wnl Psychologic: Normal mood and affect    Studies/Labs Reviewed:   EKG:  EKG is ordered today.  ECG (independently read by me): NSR at 81, RBBB, QTc 497, no ectopy  December 2019 ECG (independently read by me): Normal sinus rhythm at 95 bpm.  Right bundle branch block with repolarization changes.  No ectopy.  Recent Labs: BMP Latest Ref Rng & Units 10/27/2020 10/27/2019 02/26/2019  Glucose 65 - 99 mg/dL 124(H) 136(H) 118(H)  BUN 7 - 25 mg/dL 15 13 15   Creatinine 0.50 - 0.99 mg/dL 0.54 0.70 0.59  BUN/Creat Ratio 6 - 22 (calc) NOT APPLICABLE NOT APPLICABLE NOT APPLICABLE  Sodium 676 - 146 mmol/L 142 139 142  Potassium 3.5 - 5.3 mmol/L 4.0 3.7 3.9  Chloride 98 - 110 mmol/L 108 104 108  CO2 20 - 32 mmol/L 24 24 24   Calcium 8.6 - 10.4 mg/dL 8.6 8.9 9.0     Hepatic Function Latest Ref Rng & Units 10/27/2020 10/27/2019 02/26/2019  Total Protein 6.1 - 8.1 g/dL 6.5 6.6 6.9  Albumin 3.6 - 5.1 g/dL - - -  AST 10 - 35 U/L 15 17 17   ALT 6 - 29 U/L 17 20 20   Alk Phosphatase 33 - 130 U/L - - -  Total Bilirubin 0.2 - 1.2 mg/dL  0.5 0.4 0.5     CBC Latest Ref Rng & Units 10/27/2020 10/27/2019 06/05/2018  WBC 3.8 - 10.8 Thousand/uL 6.9 7.3 8.0  Hemoglobin 11.7 - 15.5 g/dL 13.2 14.1 14.0  Hematocrit 35.0 - 45.0 % 39.4 42.2 42.3  Platelets 140 - 400 Thousand/uL 215 221 242   Lab Results  Component Value Date   MCV 84.4 10/27/2020   MCV 85.9 10/27/2019   MCV 83.3 06/05/2018   Lab Results  Component Value Date   TSH 3.20 12/27/2016   Lab Results  Component Value Date   HGBA1C 6.0 (H) 10/27/2020     BNP No results found for: BNP  ProBNP No results found for: PROBNP   Lipid Panel     Component Value Date/Time   CHOL 135 10/27/2020 0837   TRIG 223 (H) 10/27/2020 0837   HDL 31 (L) 10/27/2020 0837   CHOLHDL 4.4 10/27/2020 0837   VLDL 48 (H) 12/27/2016 0809   LDLCALC 73 10/27/2020 0837     RADIOLOGY: No results found.   Additional studies/ records that were reviewed today include:  I reviewed the records of Dr. Dennard Schaumann as well as her recent laboratory.   ASSESSMENT:    1. Essential hypertension   2. OSA (obstructive sleep apnea)   3. Hyperlipidemia LDL goal <70   4. Morbid obesity ([place])   5. Leg edema   6. Lung nodule    PLAN:  Ms. Suhaila Troiano is a 76 -year-old female patient who is followed by Dr. Dennard Schaumann and has a history of hypertension for over 20 years in addition to obstructive sleep apnea on CPAP therapy.  She has a longstanding history of morbid obesity and has metabolic syndrome.  He has been diagnosed with a hiatal hernia and also has a history of pernicious anemia as well as peripheral neuropathy.  When I initially saw her she had tense lower extremity edema did improve with medication adjustment and diuretic therapy.  Have not seen her for 2-1/2 years.  Apparently she is back on amlodipine at 10 mg, and is on metoprolol succinate 50 mg daily, losartan 100 mg, and HCTZ 12.5 mg.  There is no significant edema today.  She did have mild tenderness in the back of her left calf.  There was no  palpable cord.  There was no swelling.  Bevelyn Buckles' sign was negative.  BMI is 44 consistent with morbid obesity.  Her echo Doppler study from December 2019 showed hyperdynamic LV function with EF at 65 to 95%, grade 1 diastolic dysfunction, mild left atrial enlargement, and moderate LVH.  Coronary calcium score was low at 23.  She is not having anginal symptoms.  She does experience some chest cramping sensation when she gets upset but I suspect this may be more musculoskeletal.  In addition, she was noted to have elation of her esophagus seeing some dysmotility disorder.  She has been undergoing follow-up evaluations for a lung nodule.  She continues to use CPAP with 100% compliance.  I discouraged her from a keto diet and recommended she consider a Mediterranean diet which is better for long-term heart health compared to the keto diet.  I discussed the importance of exercise and weight loss.  We will follow-up with Dr. Dennard Schaumann.  I will see her in 1 year for follow-up evaluation.  Medication Adjustments/Labs and Tests Ordered: Current medicines are reviewed at length with the patient today.  Concerns regarding medicines are outlined above.  Medication changes, Labs and Tests ordered today are listed  in the Patient Instructions below. Patient Instructions  Medication Instructions:  Your physician recommends that you continue on your current medications as directed. Please refer to the Current Medication list given to you today.  *If you need a refill on your cardiac medications before your next appointment, please call your pharmacy*   Follow-Up: At [facility], you and your health needs are our priority.  As part of our continuing mission to provide you with exceptional heart care, we have created designated Provider Care Teams.  These Care Teams include your primary Cardiologist (physician) and Advanced Practice Providers (APPs -  Physician Assistants and Nurse Practitioners) who all work together to  provide you with the care you need, when you need it.  We recommend signing up for the patient portal called "MyChart".  Sign up information is provided on this After Visit Summary.  MyChart is used to connect with patients for Virtual Visits (Telemedicine).  Patients are able to view lab/test results, encounter notes, upcoming appointments, etc.  Non-urgent messages can be sent to your provider as well.   To learn more about what you can do with MyChart, go to [email or web address].    Your next appointment:   12 month(s)  The format for your next appointment:   In Person  Provider:   Shelva Majestic, MD       Signed, Shelva Majestic, MD  12/07/2020 5:18 PM    [facility] [address] [place], [place], [place]  [address] Phone: [phone]

## 2020-12-05 NOTE — Patient Instructions (Signed)

## 2020-12-07 ENCOUNTER — Encounter: Payer: Self-pay | Admitting: Cardiovascular Disease

## 2020-12-26 ENCOUNTER — Telehealth: Payer: Self-pay | Admitting: Pharmacist

## 2020-12-26 NOTE — Progress Notes (Addendum)
Chronic Care Management Pharmacy Assistant   Name: Kelli Moss  MRN: [id number] DOB: Nov 09, 1950  Reason for Encounter: Disease State For DM/General.   Conditions to be addressed/monitored: Pre-diabetes, dyslipidemia, HTN.  Recent office visits:  11/02/20 Dr. Dennard Schaumann For general medical exam. Per note: Patient will be scheduled for a CT scan, recommend a mammogram and colonoscopy.STOPPED Celecoxib and Gabapentin.  Recent consult visits:  12/05/20 Cardiology Troy Sine, MD. For follow-Up. No medication changes.   Hospital visits:  None in previous 6 months  Medications: Outpatient Encounter Medications as of 12/26/2020  Medication Sig   amLODipine (NORVASC) 10 MG tablet Take 1 tablet (10 mg total) by mouth daily.   ammonium lactate (AMLACTIN) 12 % cream Apply topically as needed for dry skin.   aspirin 81 MG tablet Take 81 mg by mouth daily.   CINNAMON PO Take 2,000 mg by mouth daily.   cyanocobalamin (,VITAMIN B-12,) 1000 MCG/ML injection INJECT 1 ML IN THE MUSCLE ONCE MONTHLY   diazepam (VALIUM) 10 MG tablet TAKE 1 TABLET BY MOUTH EVERY 8 HOURS AS NEEDED FOR MUSCLE SPASM   Efinaconazole 10 % SOLN As directed for toenail fungus   Flaxseed, Linseed, (FLAX SEEDS PO) Take 1 tablet by mouth daily.   fluticasone (FLONASE) 50 MCG/ACT nasal spray Place into both nostrils daily.   Grape Seed 100 MG CAPS Take by mouth.   hydrochlorothiazide (MICROZIDE) 12.5 MG capsule Take 1 capsule (12.5 mg total) by mouth daily.   LORazepam (ATIVAN) 1 MG tablet Take 1 tablet (1 mg total) by mouth at bedtime as needed.   losartan (COZAAR) 100 MG tablet Take 1 tablet (100 mg total) by mouth daily.   Magnesium 500 MG TABS Take 500 mg by mouth.   metoprolol succinate (TOPROL-XL) 50 MG 24 hr tablet Take 1 tablet (50 mg total) by mouth daily. Take with or immediately following a meal.   Misc Natural Products (BLACK CHERRY CONCENTRATE PO) Take by mouth.   omega-3 acid ethyl esters (LOVAZA) 1 g capsule  Take 1 capsule (1 g total) by mouth 2 (two) times daily.   Polyethyl Glycol-Propyl Glycol (SYSTANE OP) Apply to eye.   tiZANidine (ZANAFLEX) 4 MG tablet TAKE ONE TABLET BY MOUTH EVERY 8 HOURS AS NEEDED FOR  MUSCLE  SPASMS   TURMERIC PO Take 1,000 mg by mouth.   UNABLE TO FIND Med Name: TENS UNIT DX: M54.2; M54.9   No facility-administered encounter medications on file as of 12/26/2020.    Recent Relevant Labs: Lab Results  Component Value Date/Time   HGBA1C 6.0 (H) 10/27/2020 08:37 AM   HGBA1C 6.0 (H) 10/27/2019 08:01 AM   MICROALBUR 1.5 10/27/2020 08:37 AM    Kidney Function Lab Results  Component Value Date/Time   CREATININE 0.54 10/27/2020 08:37 AM   CREATININE 0.70 10/27/2019 08:01 AM   GFRNONAA 96 10/27/2020 08:37 AM   GFRAA 112 10/27/2020 08:37 AM   Called patient to do an assessment call for DM but patient is only pre-diabetic. She is not on any diabetic medication and does not have to check her sugar at this time. The patient would like the pharmacist to call her in regards of her Vitamin B12 because she is still having problems with the cost of them. We discussed insurance coverage and the purpose of CCM.   Follow-Up: Pharmacist Review  [name], RMA Clinical Pharmacist Assistant [phone]  9 minutes spent in review, coordination, and documentation. Attempted to call patient to discuss but was unable to  connect.  Reviewed by: [name], PharmD Clinical Pharmacist [facility] [phone]

## 2021-01-11 ENCOUNTER — Other Ambulatory Visit (HOSPITAL_COMMUNITY): Payer: Self-pay | Admitting: Family Medicine

## 2021-01-11 DIAGNOSIS — N644 Mastodynia: Secondary | ICD-10-CM

## 2021-01-26 NOTE — Progress Notes (Signed)
[place] Phone: [phone] Subjective:   [name], am serving as a scribe for Dr. Hulan Saas.  This visit occurred during the SARS-CoV-2 public health emergency.  Safety protocols were in place, including screening questions prior to the visit, additional usage of staff PPE, and extensive cleaning of exam room while observing appropriate contact time as indicated for disinfecting solutions.    I'm seeing this patient by the request  of:  [name], MD  CC: Left hip pain  [id number]  AREATHA KALATA is a 70 y.o. female coming in with complaint of L hip and back pain for 3 weeks. Patient states that her pain increases with sitting on glute. Having hard time sleeping. Using Tylenol, topical analgesic. Pain radiates from L glute into proximal hamstring.  Does not remember any true injury.  Patient states it happened when she was sat for a long time and since then has had more discomfort.  Some mild radiation down the leg but never past her knee.  Patient is concerned because she has a car trip of 4 hours coming up in the next week.  Denies fevers, chills, any abnormal weight loss.  Denies any bowel or bladder changes.  Patient has a history of a kidney stone but states that this is completely different.    Past Medical History:  Diagnosis Date   Colon polyp    CTS (carpal tunnel syndrome)    Depression    Diabetes mellitus without complication ([place])    prediabetes   Dyslipidemia    Gastritis    Hypertension    Neuromuscular disorder ([place])    peripheral neuropathy   No past surgical history on file. Social History   Socioeconomic History   Marital status: Married    Spouse name: Not on file   Number of children: Not on file   Years of education: Not on file   Highest education level: Not on file  Occupational History   Not on file  Tobacco Use   Smoking status: Former    Pack years:  0.00    Types: Cigarettes    Quit date: 01/23/1988    Years since quitting: 33.0   Smokeless tobacco: Never  Substance and Sexual Activity   Alcohol use: No   Drug use: No   Sexual activity: Not on file  Other Topics Concern   Not on file  Social History Narrative   Not on file   Social Determinants of Health   Financial Resource Strain: Low Risk    Difficulty of Paying Living Expenses: Not very hard  Food Insecurity: Not on file  Transportation Needs: Not on file  Physical Activity: Not on file  Stress: Not on file  Social Connections: Not on file   Allergies  Allergen Reactions   Lyrica [Pregabalin]     nightmares   No family history on file.  Current Outpatient Medications (Endocrine & Metabolic):    predniSONE (DELTASONE) 20 MG tablet, Take 1 tablet (20 mg total) by mouth daily with breakfast.  Current Outpatient Medications (Cardiovascular):    amLODipine (NORVASC) 10 MG tablet, Take 1 tablet (10 mg total) by mouth daily.   hydrochlorothiazide (MICROZIDE) 12.5 MG capsule, Take 1 capsule (12.5 mg total) by mouth daily.   losartan (COZAAR) 100 MG tablet, Take 1 tablet (100 mg total) by mouth daily.   metoprolol succinate (TOPROL-XL) 50 MG 24 hr tablet, Take 1 tablet (50 mg  total) by mouth daily. Take with or immediately following a meal.   omega-3 acid ethyl esters (LOVAZA) 1 g capsule, Take 1 capsule (1 g total) by mouth 2 (two) times daily.  Current Outpatient Medications (Respiratory):    fluticasone (FLONASE) 50 MCG/ACT nasal spray, Place into both nostrils daily.  Current Outpatient Medications (Analgesics):    aspirin 81 MG tablet, Take 81 mg by mouth daily.   meloxicam (MOBIC) 7.5 MG tablet, Take 1 tablet (7.5 mg total) by mouth daily.  Current Outpatient Medications (Hematological):    cyanocobalamin (,VITAMIN B-12,) 1000 MCG/ML injection, INJECT 1 ML IN THE MUSCLE ONCE MONTHLY  Current Outpatient Medications (Other):    ammonium lactate (AMLACTIN) 12 %  cream, Apply topically as needed for dry skin.   CINNAMON PO, Take 2,000 mg by mouth daily.   diazepam (VALIUM) 10 MG tablet, TAKE 1 TABLET BY MOUTH EVERY 8 HOURS AS NEEDED FOR MUSCLE SPASM   Efinaconazole 10 % SOLN, As directed for toenail fungus   Flaxseed, Linseed, (FLAX SEEDS PO), Take 1 tablet by mouth daily.   Grape Seed 100 MG CAPS, Take by mouth.   LORazepam (ATIVAN) 1 MG tablet, Take 1 tablet (1 mg total) by mouth at bedtime as needed.   Magnesium 500 MG TABS, Take 500 mg by mouth.   Misc Natural Products (BLACK CHERRY CONCENTRATE PO), Take by mouth.   Polyethyl Glycol-Propyl Glycol (SYSTANE OP), Apply to eye.   tiZANidine (ZANAFLEX) 4 MG tablet, TAKE ONE TABLET BY MOUTH EVERY 8 HOURS AS NEEDED FOR  MUSCLE  SPASMS   TURMERIC PO, Take 1,000 mg by mouth.   UNABLE TO FIND, Med Name: TENS UNIT DX: M54.2; M54.9   Reviewed prior external information including notes and imaging from  primary care provider As well as notes that were available from care everywhere and other healthcare systems.  Past medical history, social, surgical and family history all reviewed in electronic medical record.  No pertanent information unless stated regarding to the chief complaint.   Review of Systems:  No headache, visual changes, nausea, vomiting, diarrhea, constipation, dizziness, abdominal pain, skin rash, fevers, chills, night sweats, weight loss, swollen lymph nodes, body aches, joint swelling, chest pain, shortness of breath, mood changes. POSITIVE muscle aches  Objective  Blood pressure 140/74, pulse 81, height 5\' 7"  (1.702 m), weight 258 lb (117 kg), SpO2 96 %.   General: No apparent distress alert and oriented x3 mood and affect normal, dressed appropriately.  HEENT: Pupils equal, extraocular movements intact  Respiratory: Patient's speak in full sentences and does not appear short of breath  Cardiovascular: No lower extremity edema, non tender, no erythema  Gait normal with good balance and  coordination.  MSK:  low back exam  show tenderness. TTP in the left piriformis patient's pain is out of proportion to the amount of palpation.  Patient has negative straight leg test.  Patient does have some worsening pain with FABER test.  Neurovascularly intact distally but does have some peripheral neuropathy at baseline.  Deep tendon reflexes appear to be intact.  Tenderness to palpation over the L4-L5 paraspinal musculature on the left side as well.  [id number]; 15 additional minutes spent for Therapeutic exercises as stated in above notes.  This included exercises focusing on stretching, strengthening, with significant focus on eccentric aspects.   Long term goals include an improvement in range of motion, strength, endurance as well as avoiding reinjury. Patient's frequency would include in 1-2 times a day, 3-5 times a week  for a duration of 6-12 weeks. Low back exercises that included:  Pelvic tilt/bracing instruction to focus on control of the pelvic girdle and lower abdominal muscles  Glute strengthening exercises, focusing on proper firing of the glutes without engaging the low back muscles Proper stretching techniques for maximum relief for the hamstrings, hip flexors, low back and some rotation where tolerated  Proper technique shown and discussed handout in great detail with ATC.  All questions were discussed and answered.     Impression and Recommendations:     The above documentation has been reviewed and is accurate and complete Lyndal Pulley, DO

## 2021-01-27 ENCOUNTER — Ambulatory Visit (INDEPENDENT_AMBULATORY_CARE_PROVIDER_SITE_OTHER): Payer: Medicare Other

## 2021-01-27 ENCOUNTER — Ambulatory Visit (INDEPENDENT_AMBULATORY_CARE_PROVIDER_SITE_OTHER): Payer: Medicare Other | Admitting: Family Medicine

## 2021-01-27 ENCOUNTER — Encounter: Payer: Self-pay | Admitting: Family Medicine

## 2021-01-27 ENCOUNTER — Ambulatory Visit: Payer: Medicare Other | Admitting: Family Medicine

## 2021-01-27 ENCOUNTER — Other Ambulatory Visit: Payer: Self-pay

## 2021-01-27 VITALS — BP 140/74 | HR 81 | Ht 67.0 in | Wt 258.0 lb

## 2021-01-27 DIAGNOSIS — M542 Cervicalgia: Secondary | ICD-10-CM | POA: Diagnosis not present

## 2021-01-27 DIAGNOSIS — M25511 Pain in right shoulder: Secondary | ICD-10-CM | POA: Diagnosis not present

## 2021-01-27 DIAGNOSIS — G8929 Other chronic pain: Secondary | ICD-10-CM

## 2021-01-27 DIAGNOSIS — M25552 Pain in left hip: Secondary | ICD-10-CM | POA: Insufficient documentation

## 2021-01-27 DIAGNOSIS — M160 Bilateral primary osteoarthritis of hip: Secondary | ICD-10-CM | POA: Diagnosis not present

## 2021-01-27 DIAGNOSIS — M545 Low back pain, unspecified: Secondary | ICD-10-CM

## 2021-01-27 DIAGNOSIS — M47816 Spondylosis without myelopathy or radiculopathy, lumbar region: Secondary | ICD-10-CM | POA: Diagnosis not present

## 2021-01-27 DIAGNOSIS — M19011 Primary osteoarthritis, right shoulder: Secondary | ICD-10-CM | POA: Diagnosis not present

## 2021-01-27 DIAGNOSIS — M47812 Spondylosis without myelopathy or radiculopathy, cervical region: Secondary | ICD-10-CM | POA: Diagnosis not present

## 2021-01-27 MED ORDER — PREDNISONE 20 MG PO TABS
20.0000 mg | ORAL_TABLET | Freq: Every day | ORAL | 0 refills | Status: DC
Start: 2021-01-27 — End: 2021-08-28

## 2021-01-27 MED ORDER — KETOROLAC TROMETHAMINE 60 MG/2ML IM SOLN
60.0000 mg | Freq: Once | INTRAMUSCULAR | Status: AC
  Administered 2021-01-27: 09:00:00 60 mg via INTRAMUSCULAR

## 2021-01-27 MED ORDER — MELOXICAM 7.5 MG PO TABS
7.5000 mg | ORAL_TABLET | Freq: Every day | ORAL | 0 refills | Status: DC
Start: 2021-01-27 — End: 2021-01-27

## 2021-01-27 MED ORDER — MELOXICAM 7.5 MG PO TABS
7.5000 mg | ORAL_TABLET | Freq: Every day | ORAL | 0 refills | Status: DC
Start: 2021-01-27 — End: 2023-08-12

## 2021-01-27 MED ORDER — METHYLPREDNISOLONE ACETATE 80 MG/ML IJ SUSP
80.0000 mg | Freq: Once | INTRAMUSCULAR | Status: AC
  Administered 2021-01-27: 09:00:00 80 mg via INTRAMUSCULAR

## 2021-01-27 MED ORDER — PREDNISONE 20 MG PO TABS
20.0000 mg | ORAL_TABLET | Freq: Every day | ORAL | 0 refills | Status: DC
Start: 2021-01-27 — End: 2021-01-27

## 2021-01-27 NOTE — Patient Instructions (Addendum)
Xray today Starting tomorrow predinsone 20 mg for 5 days Take zanaflex nightly for 3 nights then as needed PT [employer]. After prednisone use meloxicam 7.5 mg daily for 10 days stop if it hurts your stomach Do not use NSAIDS such as Advil or Aleve when taking Meloxicam It is ok to use Tylenol for additional pain relief See me again on 6/27

## 2021-01-27 NOTE — Assessment & Plan Note (Signed)
Difficult to assess the patient continue to perform symptoms with potential lumbar radiculopathy at this time.  Discussed with patient at great length.  Patient has had an allergy to Toradol in the Depo-Medrol today and given a short course of prednisone starting tomorrow.  Warned of potential side effects.  Patient's A1c has been 6.0.  Discussed after the prednisone to do a short course of 10 days of meloxicam afterwards.  Warned to not use other anti-inflammatories.  Home exercises given and we will get patient into formal physical therapy with the dry needling will be beneficial.  Follow-up with me again within the next 2 to 3 weeks.

## 2021-02-10 NOTE — Progress Notes (Signed)
[place] [address] [place] Phone: [phone] Subjective:   I Kelli Moss am serving as a Education administrator for Dr. Hulan Saas.  This visit occurred during the SARS-CoV-2 public health emergency.  Safety protocols were in place, including screening questions prior to the visit, additional usage of staff PPE, and extensive cleaning of exam room while observing appropriate contact time as indicated for disinfecting solutions.   I'm seeing this patient by the request  of:  [name], MD  CC: Multiple joint complaints left hip and back worse than anything else  [id number]  01/27/2021 Difficult to assess the patient continue to perform symptoms with potential lumbar radiculopathy at this time.  Discussed with patient at great length.  Patient has had an allergy to Toradol in the Depo-Medrol today and given a short course of prednisone starting tomorrow.  Warned of potential side effects.  Patient's A1c has been 6.0.  Discussed after the prednisone to do a short course of 10 days of meloxicam afterwards.  Warned to not use other anti-inflammatories.  Home exercises given and we will get patient into formal physical therapy with the dry needling will be beneficial.  Follow-up with me again within the next 2 to 3 weeks.  Update 02/13/2021 JEANINE CAVEN is a 70 y.o. female coming in with complaint of L hip, R shoulder and cervical spine pain. Patient states that riding in the car to the beach was a big mistake. States the ride hurt the hip. Right shoulder feels about the same. Neck pain is the same as well.   Lumbar xray 01/27/2021 IMPRESSION: Degenerative changes multilevel  Xray pelvis 01/27/2021 IMPRESSION: Enthesopathic changes at the anterior superior and inferior iliac spines bilaterally. Degenerative changes in the hips without loss of joint space on the right and perhaps minimal superior joint space loss on the left.  Xray R  shoulder 01/27/2021 IMPRESSION: AC joint degenerative changes. Mild irregularity along the lateral aspect of the humeral head may be associated with rotator cuff pathology.    Xray cervical 01/27/2021 IMPRESSION: Poor evaluation below the C6 level on the lateral view. Bridging anterior osteophytes. Uncovertebral degenerative changes. No other abnormalities.    Past Medical History:  Diagnosis Date   Colon polyp    CTS (carpal tunnel syndrome)    Depression    Diabetes mellitus without complication ([place])    prediabetes   Dyslipidemia    Gastritis    Hypertension    Neuromuscular disorder ([place])    peripheral neuropathy   No past surgical history on file. Social History   Socioeconomic History   Marital status: Married    Spouse name: Not on file   Number of children: Not on file   Years of education: Not on file   Highest education level: Not on file  Occupational History   Not on file  Tobacco Use   Smoking status: Former    Pack years: 0.00    Types: Cigarettes    Quit date: 01/23/1988    Years since quitting: 33.0   Smokeless tobacco: Never  Substance and Sexual Activity   Alcohol use: No   Drug use: No   Sexual activity: Not on file  Other Topics Concern   Not on file  Social History Narrative   Not on file   Social Determinants of Health   Financial Resource Strain: Low Risk    Difficulty of Paying Living Expenses: Not very hard  Food Insecurity: Not on  file  Transportation Needs: Not on file  Physical Activity: Not on file  Stress: Not on file  Social Connections: Not on file   Allergies  Allergen Reactions   Lyrica [Pregabalin]     nightmares   No family history on file.  Current Outpatient Medications (Endocrine & Metabolic):    predniSONE (DELTASONE) 20 MG tablet, Take 1 tablet (20 mg total) by mouth daily with breakfast.  Current Outpatient Medications (Cardiovascular):    amLODipine (NORVASC) 10 MG tablet, Take 1 tablet (10 mg total) by  mouth daily.   hydrochlorothiazide (MICROZIDE) 12.5 MG capsule, Take 1 capsule (12.5 mg total) by mouth daily.   losartan (COZAAR) 100 MG tablet, Take 1 tablet (100 mg total) by mouth daily.   metoprolol succinate (TOPROL-XL) 50 MG 24 hr tablet, Take 1 tablet (50 mg total) by mouth daily. Take with or immediately following a meal.   omega-3 acid ethyl esters (LOVAZA) 1 g capsule, Take 1 capsule (1 g total) by mouth 2 (two) times daily.  Current Outpatient Medications (Respiratory):    fluticasone (FLONASE) 50 MCG/ACT nasal spray, Place into both nostrils daily.  Current Outpatient Medications (Analgesics):    aspirin 81 MG tablet, Take 81 mg by mouth daily.   meloxicam (MOBIC) 7.5 MG tablet, Take 1 tablet (7.5 mg total) by mouth daily.  Current Outpatient Medications (Hematological):    cyanocobalamin (,VITAMIN B-12,) 1000 MCG/ML injection, INJECT 1 ML IN THE MUSCLE ONCE MONTHLY  Current Outpatient Medications (Other):    ammonium lactate (AMLACTIN) 12 % cream, Apply topically as needed for dry skin.   CINNAMON PO, Take 2,000 mg by mouth daily.   diazepam (VALIUM) 10 MG tablet, TAKE 1 TABLET BY MOUTH EVERY 8 HOURS AS NEEDED FOR MUSCLE SPASM   DULoxetine (CYMBALTA) 20 MG capsule, Take 1 capsule (20 mg total) by mouth daily.   Efinaconazole 10 % SOLN, As directed for toenail fungus   Flaxseed, Linseed, (FLAX SEEDS PO), Take 1 tablet by mouth daily.   Grape Seed 100 MG CAPS, Take by mouth.   LORazepam (ATIVAN) 1 MG tablet, Take 1 tablet (1 mg total) by mouth at bedtime as needed.   Magnesium 500 MG TABS, Take 500 mg by mouth.   Misc Natural Products (BLACK CHERRY CONCENTRATE PO), Take by mouth.   Polyethyl Glycol-Propyl Glycol (SYSTANE OP), Apply to eye.   tiZANidine (ZANAFLEX) 4 MG tablet, TAKE ONE TABLET BY MOUTH EVERY 8 HOURS AS NEEDED FOR  MUSCLE  SPASMS   TURMERIC PO, Take 1,000 mg by mouth.   UNABLE TO FIND, Med Name: TENS UNIT DX: M54.2; M54.9   Reviewed prior external  information including notes and imaging from  primary care provider As well as notes that were available from care everywhere and other healthcare systems.  Past medical history, social, surgical and family history all reviewed in electronic medical record.  No pertanent information unless stated regarding to the chief complaint.   Review of Systems:  No headache, visual changes, nausea, vomiting, diarrhea, constipation, dizziness, abdominal pain, skin rash, fevers, chills, night sweats, weight loss, swollen lymph nodes, b chest pain, shortness of breath, mood changes. POSITIVE muscle aches, joint pain and body aches  Objective  Blood pressure 130/78, pulse 71, height 5\' 7"  (1.702 m), weight 253 lb (114.8 kg), SpO2 97 %.   General: No apparent distress alert and oriented x3 mood and affect normal, dressed appropriately.  HEENT: Pupils equal, extraocular movements intact  Respiratory: Patient's speak in full sentences and does not  appear short of breath  Cardiovascular: No lower extremity edema, non tender, no erythema  Gait normal with good balance and coordination.  MSK: Patient is in diffuse tenderness and muscle musculature in the arms.  The morning discomfort going on the lumbar spine.  Patient during the visit would get up and walk around because patient is unable to sit comfortably.  No CVA tenderness noted.  Pain is out of proportion to the amount of palpation.  Limited internal range of motion of the hip noted.  Tenderness severely over the paraspinal musculature of the lumbar spine.  Still considering the possibility of positive straight leg test but difficult to assess secondary to patient having voluntary guarding noted.    Impression and Recommendations:     The above documentation has been reviewed and is accurate and complete Lyndal Pulley, DO

## 2021-02-13 ENCOUNTER — Encounter: Payer: Self-pay | Admitting: Family Medicine

## 2021-02-13 ENCOUNTER — Ambulatory Visit (INDEPENDENT_AMBULATORY_CARE_PROVIDER_SITE_OTHER): Payer: Medicare Other | Admitting: Family Medicine

## 2021-02-13 ENCOUNTER — Other Ambulatory Visit: Payer: Self-pay

## 2021-02-13 VITALS — BP 130/78 | HR 71 | Ht 67.0 in | Wt 253.0 lb

## 2021-02-13 DIAGNOSIS — M25511 Pain in right shoulder: Secondary | ICD-10-CM | POA: Diagnosis not present

## 2021-02-13 DIAGNOSIS — E559 Vitamin D deficiency, unspecified: Secondary | ICD-10-CM | POA: Diagnosis not present

## 2021-02-13 DIAGNOSIS — M216X9 Other acquired deformities of unspecified foot: Secondary | ICD-10-CM

## 2021-02-13 DIAGNOSIS — E038 Other specified hypothyroidism: Secondary | ICD-10-CM | POA: Diagnosis not present

## 2021-02-13 DIAGNOSIS — M542 Cervicalgia: Secondary | ICD-10-CM

## 2021-02-13 DIAGNOSIS — E538 Deficiency of other specified B group vitamins: Secondary | ICD-10-CM

## 2021-02-13 DIAGNOSIS — M2550 Pain in unspecified joint: Secondary | ICD-10-CM

## 2021-02-13 DIAGNOSIS — M25552 Pain in left hip: Secondary | ICD-10-CM | POA: Diagnosis not present

## 2021-02-13 DIAGNOSIS — G8929 Other chronic pain: Secondary | ICD-10-CM

## 2021-02-13 LAB — SEDIMENTATION RATE: Sed Rate: 38 mm/hr — ABNORMAL HIGH (ref 0–30)

## 2021-02-13 LAB — IBC PANEL
Iron: 60 ug/dL (ref 42–145)
Saturation Ratios: 16.7 % — ABNORMAL LOW (ref 20.0–50.0)
Transferrin: 257 mg/dL (ref 212.0–360.0)

## 2021-02-13 LAB — URIC ACID: Uric Acid, Serum: 5.2 mg/dL (ref 2.4–7.0)

## 2021-02-13 LAB — COMPREHENSIVE METABOLIC PANEL
ALT: 27 U/L (ref 0–35)
AST: 19 U/L (ref 0–37)
Albumin: 4 g/dL (ref 3.5–5.2)
Alkaline Phosphatase: 58 U/L (ref 39–117)
BUN: 14 mg/dL (ref 6–23)
CO2: 24 mEq/L (ref 19–32)
Calcium: 8.9 mg/dL (ref 8.4–10.5)
Chloride: 104 mEq/L (ref 96–112)
Creatinine, Ser: 0.53 mg/dL (ref 0.40–1.20)
GFR: 93.9 mL/min (ref >60.00)
Glucose, Bld: 146 mg/dL — ABNORMAL HIGH (ref 70–99)
Potassium: 3.7 mEq/L (ref 3.5–5.1)
Sodium: 138 mEq/L (ref 135–145)
Total Bilirubin: 0.5 mg/dL (ref 0.2–1.2)
Total Protein: 7 g/dL (ref 6.0–8.3)

## 2021-02-13 LAB — CBC WITH DIFFERENTIAL/PLATELET
Basophils Absolute: 0.1 10*3/uL (ref 0.0–0.1)
Basophils Relative: 0.6 % (ref 0.0–3.0)
Eosinophils Absolute: 0.2 10*3/uL (ref 0.0–0.7)
Eosinophils Relative: 2 % (ref 0.0–5.0)
HCT: 38.1 % (ref 36.0–46.0)
Hemoglobin: 12.8 g/dL (ref 12.0–15.0)
Lymphocytes Relative: 21.3 % (ref 12.0–46.0)
Lymphs Abs: 1.8 10*3/uL (ref 0.7–4.0)
MCHC: 33.6 g/dL (ref 30.0–36.0)
MCV: 84 fl (ref 78.0–100.0)
Monocytes Absolute: 0.5 10*3/uL (ref 0.1–1.0)
Monocytes Relative: 5.7 % (ref 3.0–12.0)
Neutro Abs: 5.9 10*3/uL (ref 1.4–7.7)
Neutrophils Relative %: 70.4 % (ref 43.0–77.0)
Platelets: 236 10*3/uL (ref 150.0–400.0)
RBC: 4.54 Mil/uL (ref 3.87–5.11)
RDW: 14.5 % (ref 11.5–15.5)
WBC: 8.4 10*3/uL (ref 4.0–10.5)

## 2021-02-13 LAB — TSH: TSH: 1.62 u[IU]/mL (ref 0.35–4.50)

## 2021-02-13 LAB — C-REACTIVE PROTEIN: CRP: <1 mg/dL (ref 0.5–20.0)

## 2021-02-13 LAB — VITAMIN D 25 HYDROXY (VIT D DEFICIENCY, FRACTURES): VITD: 37.66 ng/mL (ref 30.00–100.00)

## 2021-02-13 LAB — VITAMIN B12: Vitamin B-12: 537 pg/mL (ref 211–911)

## 2021-02-13 MED ORDER — DULOXETINE HCL 20 MG PO CPEP: 20.0000 mg | ORAL_CAPSULE | Freq: Every day | ORAL | 0 refills | Status: DC

## 2021-02-13 NOTE — Assessment & Plan Note (Signed)
Patient was found to have more degenerative changes noted again.  Continues to have difficulty with sitting for long amount of time.  Discussed with patient that I do feel with patient having some discomfort and pain that this does seem to be out of proportion and possible fibromyalgia within the differential.  We discussed with the multiple different areas of pain instead of doing injections would encourage patient to consider the possibility of Cymbalta.  Patient has had difficulty with Lyrica, gabapentin and amitriptyline previously.  Patient is though willing to try the medication to see if this will help with some of the other aches and pains.  Discussed with patient to continue to stay active but we will have patient start with aquatic therapy.  Secondary to patient also pain and abnormality with gait you feel that custom orthotics could be beneficial to help with some of the pain as well.  Then patient will follow-up with me again in 6 weeks for further evaluation and treatment.  Laboratory work-up ordered today as well to rule out such things as gout or autoimmune disease that could be contributing to given difficulty.

## 2021-02-13 NOTE — Patient Instructions (Addendum)
Good to see you Labs today PT will call you Orthotics will call you-[facility] See me again in 6 weeks

## 2021-02-14 ENCOUNTER — Ambulatory Visit (HOSPITAL_COMMUNITY)
Admission: RE | Admit: 2021-02-14 | Discharge: 2021-02-14 | Disposition: A | Discharge status: Home / Self Care | Payer: Medicare Other | Source: Ambulatory Visit | Attending: Family Medicine | Admitting: Family Medicine

## 2021-02-14 DIAGNOSIS — N644 Mastodynia: Secondary | ICD-10-CM | POA: Insufficient documentation

## 2021-02-14 DIAGNOSIS — R928 Other abnormal and inconclusive findings on diagnostic imaging of breast: Secondary | ICD-10-CM | POA: Diagnosis not present

## 2021-02-16 LAB — GLIA(IGA)+IGA+TTG(IGA)
Antigliadin Abs, IgA: 3 units (ref 0–19)
IgA/Immunoglobulin A, Serum: 305 mg/dL (ref 87–352)
Transglutaminase IgA: <2 U/mL (ref 0–3)

## 2021-02-17 LAB — ANGIOTENSIN CONVERTING ENZYME: Angiotensin-Converting Enzyme: 30 U/L (ref 9–67)

## 2021-02-17 LAB — PTH, INTACT AND CALCIUM
Calcium: 8.9 mg/dL (ref 8.6–10.4)
PTH: 28 pg/mL (ref 16–77)

## 2021-02-17 LAB — ANA: Anti Nuclear Antibody (ANA): NEGATIVE

## 2021-02-17 LAB — CALCIUM, IONIZED: Calcium, Ion: 5.04 mg/dL (ref 4.8–5.6)

## 2021-02-17 LAB — CYCLIC CITRUL PEPTIDE ANTIBODY, IGG: Cyclic Citrullin Peptide Ab: <16 UNITS

## 2021-02-17 LAB — RHEUMATOID FACTOR: Rheumatoid fact SerPl-aCnc: <14 IU/mL (ref <14)

## 2021-02-22 ENCOUNTER — Telehealth: Payer: Self-pay | Admitting: *Deleted

## 2021-02-22 MED ORDER — DULOXETINE HCL 20 MG PO CPEP
20.0000 mg | ORAL_CAPSULE | Freq: Every day | ORAL | 0 refills | Status: DC
Start: 2021-02-22 — End: 2021-03-29

## 2021-02-22 NOTE — Telephone Encounter (Signed)
Pt called requesting a refill on Cymbalta 20mg .  Refill sent into pharmacy.

## 2021-03-02 DIAGNOSIS — R2689 Other abnormalities of gait and mobility: Secondary | ICD-10-CM | POA: Diagnosis not present

## 2021-03-02 DIAGNOSIS — G8921 Chronic pain due to trauma: Secondary | ICD-10-CM | POA: Diagnosis not present

## 2021-03-02 DIAGNOSIS — M5459 Other low back pain: Secondary | ICD-10-CM | POA: Diagnosis not present

## 2021-03-07 ENCOUNTER — Other Ambulatory Visit: Payer: Self-pay

## 2021-03-07 ENCOUNTER — Ambulatory Visit
Admission: RE | Admit: 2021-03-07 | Discharge: 2021-03-07 | Disposition: A | Discharge status: Home / Self Care | Payer: Medicare Other | Source: Ambulatory Visit | Attending: Family Medicine | Admitting: Family Medicine

## 2021-03-07 DIAGNOSIS — I700 Atherosclerosis of aorta: Secondary | ICD-10-CM | POA: Diagnosis not present

## 2021-03-07 DIAGNOSIS — I2510 Atherosclerotic heart disease of native coronary artery without angina pectoris: Secondary | ICD-10-CM | POA: Diagnosis not present

## 2021-03-07 DIAGNOSIS — Z87891 Personal history of nicotine dependence: Secondary | ICD-10-CM | POA: Insufficient documentation

## 2021-03-07 DIAGNOSIS — Z122 Encounter for screening for malignant neoplasm of respiratory organs: Secondary | ICD-10-CM | POA: Insufficient documentation

## 2021-03-07 DIAGNOSIS — R911 Solitary pulmonary nodule: Secondary | ICD-10-CM | POA: Diagnosis not present

## 2021-03-10 DIAGNOSIS — R2689 Other abnormalities of gait and mobility: Secondary | ICD-10-CM | POA: Diagnosis not present

## 2021-03-10 DIAGNOSIS — M5459 Other low back pain: Secondary | ICD-10-CM | POA: Diagnosis not present

## 2021-03-10 DIAGNOSIS — G8921 Chronic pain due to trauma: Secondary | ICD-10-CM | POA: Diagnosis not present

## 2021-03-15 DIAGNOSIS — M5459 Other low back pain: Secondary | ICD-10-CM | POA: Diagnosis not present

## 2021-03-15 DIAGNOSIS — R2689 Other abnormalities of gait and mobility: Secondary | ICD-10-CM | POA: Diagnosis not present

## 2021-03-15 DIAGNOSIS — G8921 Chronic pain due to trauma: Secondary | ICD-10-CM | POA: Diagnosis not present

## 2021-03-22 DIAGNOSIS — R2689 Other abnormalities of gait and mobility: Secondary | ICD-10-CM | POA: Diagnosis not present

## 2021-03-22 DIAGNOSIS — M5459 Other low back pain: Secondary | ICD-10-CM | POA: Diagnosis not present

## 2021-03-22 DIAGNOSIS — G8921 Chronic pain due to trauma: Secondary | ICD-10-CM | POA: Diagnosis not present

## 2021-03-24 DIAGNOSIS — R2689 Other abnormalities of gait and mobility: Secondary | ICD-10-CM | POA: Diagnosis not present

## 2021-03-24 DIAGNOSIS — M5459 Other low back pain: Secondary | ICD-10-CM | POA: Diagnosis not present

## 2021-03-24 DIAGNOSIS — G8921 Chronic pain due to trauma: Secondary | ICD-10-CM | POA: Diagnosis not present

## 2021-03-29 ENCOUNTER — Other Ambulatory Visit: Payer: Self-pay

## 2021-03-29 ENCOUNTER — Encounter: Payer: Self-pay | Admitting: Family Medicine

## 2021-03-29 ENCOUNTER — Ambulatory Visit (INDEPENDENT_AMBULATORY_CARE_PROVIDER_SITE_OTHER): Payer: Medicare Other | Admitting: Family Medicine

## 2021-03-29 VITALS — BP 130/60 | HR 74 | Ht 67.0 in | Wt 255.0 lb

## 2021-03-29 DIAGNOSIS — M25551 Pain in right hip: Secondary | ICD-10-CM

## 2021-03-29 DIAGNOSIS — M545 Low back pain, unspecified: Secondary | ICD-10-CM | POA: Diagnosis not present

## 2021-03-29 DIAGNOSIS — M25552 Pain in left hip: Secondary | ICD-10-CM

## 2021-03-29 NOTE — Progress Notes (Signed)
[facility] Phone: [phone] Subjective:   Rito Ehrlich, am serving as a scribe for Dr. Hulan Saas.  I'm seeing this patient by the request  of:  [name], MD  CC: Polyarthralgia follow-up  [id number]  Kelli Moss is a 70 y.o. female coming in with complaint of multiple joint pain.  Patient was seen previously given injection of Toradol and Depo-Medrol as well as a short course of prednisone.  Patient then was to progress to the meloxicam.  Patient was referred to formal physical therapy.  We discussed with patient about the possibility of Cymbalta but patient was hesitant secondary to having difficulty with other medications.  We changed formal physical therapy to aquatic therapy.  We also discussed getting custom orthotics.Patient states that she is doing better, that the  Cymbalta and the aquatic therapy seems to be helping. States she still has pain just not as frequent, but the pain is worse when she has to get in the car and drive to her appointments.   Laboratory work-up showed the patient did have mild elevated sedimentation rate but otherwise labs were unremarkable     Lumbar xray 01/27/2021 IMPRESSION: Degenerative changes multilevel   Xray pelvis 01/27/2021 IMPRESSION: Enthesopathic changes at the anterior superior and inferior iliac spines bilaterally. Degenerative changes in the hips without loss of joint space on the right and perhaps minimal superior joint space loss on the left.   Xray R shoulder 01/27/2021 IMPRESSION: AC joint degenerative changes. Mild irregularity along the lateral aspect of the humeral head may be associated with rotator cuff pathology.     Xray cervical 01/27/2021 IMPRESSION: Poor evaluation below the C6 level on the lateral view. Bridging anterior osteophytes. Uncovertebral degenerative changes. No other abnormalities.    Past Medical History:   Diagnosis Date   Colon polyp    CTS (carpal tunnel syndrome)    Depression    Diabetes mellitus without complication ([place])    prediabetes   Dyslipidemia    Gastritis    Hypertension    Neuromuscular disorder ([place])    peripheral neuropathy   No past surgical history on file. Social History   Socioeconomic History   Marital status: Married    Spouse name: Not on file   Number of children: Not on file   Years of education: Not on file   Highest education level: Not on file  Occupational History   Not on file  Tobacco Use   Smoking status: Former    Types: Cigarettes    Quit date: 01/23/1988    Years since quitting: 33.2   Smokeless tobacco: Never  Substance and Sexual Activity   Alcohol use: No   Drug use: No   Sexual activity: Not on file  Other Topics Concern   Not on file  Social History Narrative   Not on file   Social Determinants of Health   Financial Resource Strain: Low Risk    Difficulty of Paying Living Expenses: Not very hard  Food Insecurity: Not on file  Transportation Needs: Not on file  Physical Activity: Not on file  Stress: Not on file  Social Connections: Not on file   Allergies  Allergen Reactions   Lyrica [Pregabalin]     nightmares   No family history on file.  Current Outpatient Medications (Endocrine & Metabolic):    predniSONE (DELTASONE) 20 MG tablet, Take 1 tablet (20 mg total) by mouth daily with breakfast.  Current Outpatient Medications (Cardiovascular):    amLODipine (NORVASC) 10 MG tablet, Take 1 tablet (10 mg total) by mouth daily.   hydrochlorothiazide (MICROZIDE) 12.5 MG capsule, Take 1 capsule (12.5 mg total) by mouth daily.   losartan (COZAAR) 100 MG tablet, Take 1 tablet (100 mg total) by mouth daily.   metoprolol succinate (TOPROL-XL) 50 MG 24 hr tablet, Take 1 tablet (50 mg total) by mouth daily. Take with or immediately following a meal.   omega-3 acid ethyl esters (LOVAZA) 1 g capsule, Take 1 capsule (1 g total) by  mouth 2 (two) times daily.  Current Outpatient Medications (Respiratory):    fluticasone (FLONASE) 50 MCG/ACT nasal spray, Place into both nostrils daily.  Current Outpatient Medications (Analgesics):    aspirin 81 MG tablet, Take 81 mg by mouth daily.   meloxicam (MOBIC) 7.5 MG tablet, Take 1 tablet (7.5 mg total) by mouth daily.  Current Outpatient Medications (Hematological):    cyanocobalamin (,VITAMIN B-12,) 1000 MCG/ML injection, INJECT 1 ML IN THE MUSCLE ONCE MONTHLY  Current Outpatient Medications (Other):    ammonium lactate (AMLACTIN) 12 % cream, Apply topically as needed for dry skin.   CINNAMON PO, Take 2,000 mg by mouth daily.   diazepam (VALIUM) 10 MG tablet, TAKE 1 TABLET BY MOUTH EVERY 8 HOURS AS NEEDED FOR MUSCLE SPASM   DULoxetine (CYMBALTA) 20 MG capsule, Take 1 capsule (20 mg total) by mouth daily.   Efinaconazole 10 % SOLN, As directed for toenail fungus   Flaxseed, Linseed, (FLAX SEEDS PO), Take 1 tablet by mouth daily.   Grape Seed 100 MG CAPS, Take by mouth.   LORazepam (ATIVAN) 1 MG tablet, Take 1 tablet (1 mg total) by mouth at bedtime as needed.   Magnesium 500 MG TABS, Take 500 mg by mouth.   Misc Natural Products (BLACK CHERRY CONCENTRATE PO), Take by mouth.   Polyethyl Glycol-Propyl Glycol (SYSTANE OP), Apply to eye.   tiZANidine (ZANAFLEX) 4 MG tablet, TAKE ONE TABLET BY MOUTH EVERY 8 HOURS AS NEEDED FOR  MUSCLE  SPASMS   TURMERIC PO, Take 1,000 mg by mouth.   UNABLE TO FIND, Med Name: TENS UNIT DX: M54.2; M54.9   Reviewed prior external information including notes and imaging from  primary care provider As well as notes that were available from care everywhere and other healthcare systems.  Past medical history, social, surgical and family history all reviewed in electronic medical record.  No pertanent information unless stated regarding to the chief complaint.   Review of Systems:  No headache, visual changes, nausea, vomiting, diarrhea,  constipation, dizziness, abdominal pain, skin rash, fevers, chills, night sweats, weight loss, swollen lymph nodes,  joint swelling, chest pain, shortness of breath, mood changes. POSITIVE muscle aches, body aches  Objective  Blood pressure 130/60, pulse 74, height '5\' 7"'$  (1.702 m), weight 255 lb (115.7 kg), SpO2 96 %.   General: No apparent distress alert and oriented x3 mood and affect normal, dressed appropriately.  Still appears to be uncomfortable. HEENT: Pupils equal, extraocular movements intact  Respiratory: Patient's speak in full sentences and does not appear short of breath  Cardiovascular: No lower extremity edema, non tender, no erythema  Gait normal with good balance and coordination.  MSK: Patient still seems to be extremely uncomfortable.  Standing for most of the exam.  Severe tightness still noted of the musculature of the gluteal area bilaterally.  Patient has very minimal extension of the back.  Patient has significant tightness noted lumbar spine and  the hamstrings as well.  Mild radicular symptoms it appears on the left side with radicular symptoms in the L4 and L5 distribution difficulty doing muscle testing secondary to voluntary guarding.    Impression and Recommendations:     The above documentation has been reviewed and is accurate and complete Lyndal Pulley, DO

## 2021-03-29 NOTE — Assessment & Plan Note (Signed)
Patient does have some degenerative changes of the left hip but unfortunately now having more of a radicular symptoms of the lumbar back.  Do feel at this point an MRI of the lumbar and pelvis would be beneficial.  Questionable positive straight leg test.  Would like to see secondary to the severe amount of tightness in the gluteal area if anything else could be contributing.  Likely spinal stenosis and patient could potentially be a candidate for epidurals.  We discussed the possibility of continuing the duloxetine but patient would like to discontinue at this time.  Follow-up after imaging to discuss further treatment options

## 2021-03-29 NOTE — Patient Instructions (Addendum)
MRI lumbar and pelvis [phone] Stop Cymbalta Keep doing exercises Once you know when you are having MRI let us know so we can schedule you with [place]

## 2021-03-31 DIAGNOSIS — M5459 Other low back pain: Secondary | ICD-10-CM | POA: Diagnosis not present

## 2021-03-31 DIAGNOSIS — R2689 Other abnormalities of gait and mobility: Secondary | ICD-10-CM | POA: Diagnosis not present

## 2021-03-31 DIAGNOSIS — G8921 Chronic pain due to trauma: Secondary | ICD-10-CM | POA: Diagnosis not present

## 2021-04-05 ENCOUNTER — Other Ambulatory Visit: Payer: Self-pay

## 2021-04-05 ENCOUNTER — Ambulatory Visit
Admission: RE | Admit: 2021-04-05 | Discharge: 2021-04-05 | Disposition: A | Discharge status: Home / Self Care | Payer: Medicare Other | Source: Ambulatory Visit | Attending: Family Medicine | Admitting: Family Medicine

## 2021-04-05 DIAGNOSIS — R2689 Other abnormalities of gait and mobility: Secondary | ICD-10-CM | POA: Diagnosis not present

## 2021-04-05 DIAGNOSIS — M5459 Other low back pain: Secondary | ICD-10-CM | POA: Diagnosis not present

## 2021-04-05 DIAGNOSIS — N3289 Other specified disorders of bladder: Secondary | ICD-10-CM | POA: Diagnosis not present

## 2021-04-05 DIAGNOSIS — R102 Pelvic and perineal pain: Secondary | ICD-10-CM | POA: Diagnosis not present

## 2021-04-05 DIAGNOSIS — S76312A Strain of muscle, fascia and tendon of the posterior muscle group at thigh level, left thigh, initial encounter: Secondary | ICD-10-CM | POA: Diagnosis not present

## 2021-04-05 DIAGNOSIS — M545 Low back pain, unspecified: Secondary | ICD-10-CM

## 2021-04-05 DIAGNOSIS — G8921 Chronic pain due to trauma: Secondary | ICD-10-CM | POA: Diagnosis not present

## 2021-04-05 DIAGNOSIS — M25551 Pain in right hip: Secondary | ICD-10-CM

## 2021-04-05 DIAGNOSIS — M160 Bilateral primary osteoarthritis of hip: Secondary | ICD-10-CM | POA: Diagnosis not present

## 2021-04-05 DIAGNOSIS — M25552 Pain in left hip: Secondary | ICD-10-CM

## 2021-04-05 DIAGNOSIS — M48061 Spinal stenosis, lumbar region without neurogenic claudication: Secondary | ICD-10-CM | POA: Diagnosis not present

## 2021-04-06 NOTE — Progress Notes (Signed)
Kelli Moss Phone: [phone] Subjective:   Kelli Moss, am serving as a scribe for Dr. Hulan Saas. This visit occurred during the SARS-CoV-2 public health emergency.  Safety protocols were in place, including screening questions prior to the visit, additional usage of staff PPE, and extensive cleaning of exam room while observing appropriate contact time as indicated for disinfecting solutions.   I'm seeing this patient by the request  of:  [name], MD  CC: Left hip pain, back pain follow-up  [id number]  03/29/2021 Patient does have some degenerative changes of the left hip but unfortunately now having more of a radicular symptoms of the lumbar back.  Do feel at this point an MRI of the lumbar and pelvis would be beneficial.  Questionable positive straight leg test.  Would like to see secondary to the severe amount of tightness in the gluteal area if anything else could be contributing.  Likely spinal stenosis and patient could potentially be a candidate for epidurals.  We discussed the possibility of continuing the duloxetine but patient would like to discontinue at this time.  Follow-up after imaging to discuss further treatment options  Update 04/07/2021 Kelli Moss is a 70 y.o. female coming in with complaint of L hip pain. Patient unable to sit for long. Pain is improving. Patient feels that injections in backside helped her pain.   MRI lumbar 04/05/2021 IMPRESSION: 1. 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 2. Moderate left and mild right neural foraminal stenosis at L4-L5 and L5-S1. 3. Multilevel facet arthropathy, most advanced at L4-L5 and L5-S1. MRI pelvis 04/07/2021 IMPRESSION: 1. Moss acute  osseous abnormality of the pelvis. 2. Tendinosis with high-grade partial-thickness tearing of the left hamstring tendon origin with associated marrow edema within the left ischial tuberosity. 3. Mild arthropathy of the bilateral hips, bilateral sacroiliac joints, and pubic symphysis. 4. Urinary bladder wall is mildly thickened although may be accentuated by under distension.       Past Medical History:  Diagnosis Date   Colon polyp    CTS (carpal tunnel syndrome)    Depression    Diabetes mellitus without complication ([place])    prediabetes   Dyslipidemia    Gastritis    Hypertension    Neuromuscular disorder ([place])    peripheral neuropathy   Moss past surgical history on file. Social History   Socioeconomic History   Marital status: Married    Spouse name: Not on file   Number of children: Not on file   Years of education: Not on file   Highest education level: Not on file  Occupational History   Not on file  Tobacco Use   Smoking status: Former    Types: Cigarettes    Quit date: 01/23/1988    Years since quitting: 33.2   Smokeless tobacco: Never  Substance and Sexual Activity   Alcohol use: Moss   Drug use: Moss   Sexual activity: Not on file  Other Topics Concern   Not on file  Social History Narrative   Not on file   Social Determinants of Health   Financial Resource Strain: Not on file  Food Insecurity: Not on file  Transportation Needs: Not on file  Physical Activity: Not on file  Stress: Not on file  Social Connections:  Not on file   Allergies  Allergen Reactions   Lyrica [Pregabalin]     nightmares   Moss family history on file.  Current Outpatient Medications (Endocrine & Metabolic):    predniSONE (DELTASONE) 20 MG tablet, Take 1 tablet (20 mg total) by mouth daily with breakfast.  Current Outpatient Medications (Cardiovascular):    amLODipine (NORVASC) 10 MG tablet, Take 1 tablet (10 mg total) by mouth daily.   hydrochlorothiazide (MICROZIDE) 12.5  MG capsule, Take 1 capsule (12.5 mg total) by mouth daily.   losartan (COZAAR) 100 MG tablet, Take 1 tablet (100 mg total) by mouth daily.   metoprolol succinate (TOPROL-XL) 50 MG 24 hr tablet, Take 1 tablet (50 mg total) by mouth daily. Take with or immediately following a meal.   omega-3 acid ethyl esters (LOVAZA) 1 g capsule, Take 1 capsule (1 g total) by mouth 2 (two) times daily.  Current Outpatient Medications (Respiratory):    fluticasone (FLONASE) 50 MCG/ACT nasal spray, Place into both nostrils daily.  Current Outpatient Medications (Analgesics):    aspirin 81 MG tablet, Take 81 mg by mouth daily.   meloxicam (MOBIC) 7.5 MG tablet, Take 1 tablet (7.5 mg total) by mouth daily.  Current Outpatient Medications (Hematological):    cyanocobalamin (,VITAMIN B-12,) 1000 MCG/ML injection, INJECT 1 ML IN THE MUSCLE ONCE MONTHLY  Current Outpatient Medications (Other):    ammonium lactate (AMLACTIN) 12 % cream, Apply topically as needed for dry skin.   CINNAMON PO, Take 2,000 mg by mouth daily.   diazepam (VALIUM) 10 MG tablet, TAKE 1 TABLET BY MOUTH EVERY 8 HOURS AS NEEDED FOR MUSCLE SPASM   Efinaconazole 10 % SOLN, As directed for toenail fungus   Flaxseed, Linseed, (FLAX SEEDS PO), Take 1 tablet by mouth daily.   Grape Seed 100 MG CAPS, Take by mouth.   LORazepam (ATIVAN) 1 MG tablet, Take 1 tablet (1 mg total) by mouth at bedtime as needed.   Magnesium 500 MG TABS, Take 500 mg by mouth.   Misc Natural Products (BLACK CHERRY CONCENTRATE PO), Take by mouth.   Polyethyl Glycol-Propyl Glycol (SYSTANE OP), Apply to eye.   tiZANidine (ZANAFLEX) 4 MG tablet, TAKE ONE TABLET BY MOUTH EVERY 8 HOURS AS NEEDED FOR  MUSCLE  SPASMS   TURMERIC PO, Take 1,000 mg by mouth.   UNABLE TO FIND, Med Name: TENS UNIT DX: M54.2; M54.9   Vitamin D, Ergocalciferol, (DRISDOL) 1.25 MG (50000 UNIT) CAPS capsule, Take 1 capsule (50,000 Units total) by mouth every 7 (seven) days.   Reviewed prior external  information including notes and imaging from  primary care provider As well as notes that were available from care everywhere and other healthcare systems.  Past medical history, social, surgical and family history all reviewed in electronic medical record.  Moss pertanent information unless stated regarding to the chief complaint.   Review of Systems:  Moss headache, visual changes, nausea, vomiting, diarrhea, constipation, dizziness, abdominal pain, skin rash, fevers, chills, night sweats, weight loss, swollen lymph nodes, body aches, joint swelling, chest pain, shortness of breath, mood changes. POSITIVE muscle aches  Objective  Blood pressure (!) 128/58, pulse (!) 106, height '5\' 7"'$  (1.702 m), weight 258 lb (117 kg), SpO2 98 %.   General: Moss apparent distress alert and oriented x3 mood and affect normal, dressed appropriately.  Overweight HEENT: Pupils equal, extraocular movements intact  Respiratory: Patient's speak in full sentences and does not appear short of breath  Cardiovascular: Moss lower extremity edema, non tender,  Moss erythema  Gait normal with good balance and coordination.  MSK: Mild arthritic changes of multiple joints Tightness with FABER test on the left side.  Moss true radicular symptoms.  Mild pain noted in the ischial area.     Impression and Recommendations:     The above documentation has been reviewed and is accurate and complete Lyndal Pulley, DO

## 2021-04-07 ENCOUNTER — Other Ambulatory Visit: Payer: Self-pay

## 2021-04-07 ENCOUNTER — Encounter: Payer: Self-pay | Admitting: Family Medicine

## 2021-04-07 ENCOUNTER — Ambulatory Visit (INDEPENDENT_AMBULATORY_CARE_PROVIDER_SITE_OTHER): Payer: Medicare Other | Admitting: Family Medicine

## 2021-04-07 DIAGNOSIS — R2689 Other abnormalities of gait and mobility: Secondary | ICD-10-CM | POA: Diagnosis not present

## 2021-04-07 DIAGNOSIS — M5459 Other low back pain: Secondary | ICD-10-CM | POA: Diagnosis not present

## 2021-04-07 DIAGNOSIS — G8921 Chronic pain due to trauma: Secondary | ICD-10-CM | POA: Diagnosis not present

## 2021-04-07 DIAGNOSIS — M25552 Pain in left hip: Secondary | ICD-10-CM

## 2021-04-07 MED ORDER — VITAMIN D (ERGOCALCIFEROL) 1.25 MG (50000 UNIT) PO CAPS: 50000.0000 [IU] | ORAL_CAPSULE | ORAL | 0 refills | Status: AC

## 2021-04-07 NOTE — Assessment & Plan Note (Signed)
Left hip pain appears to be more after the MRI of the hamstring tendinitis with partial tearing as well as being potential stress reaction of the ischial tuberosity.  Hopefully patient will continue to report improvement that she is using the moment.  Discussed with patient potentially trying to find compression sleeve with Velcro.  Discussed with patient to continue with formal physical therapy goals discussed which activities to potentially avoid such as full extension noted.  Patient does have back MRI as well showing some degenerative disc disease, foraminal narrowing and moderate spinal stenosis but would not be consistent with the radicular symptoms that she is having at this time.  Patient will follow up with me again in 4 to 6 weeks.  Total time reviewing patient's MRI, discussing with her as well as her husband about the treatment options 33 minutes

## 2021-04-07 NOTE — Patient Instructions (Signed)
Good to see you  Continue PT but have them treat you for more hamstring  Once weekly Vit D sent in  See me again in 6-8 week

## 2021-04-12 DIAGNOSIS — M5459 Other low back pain: Secondary | ICD-10-CM | POA: Diagnosis not present

## 2021-04-12 DIAGNOSIS — G8921 Chronic pain due to trauma: Secondary | ICD-10-CM | POA: Diagnosis not present

## 2021-04-12 DIAGNOSIS — R2689 Other abnormalities of gait and mobility: Secondary | ICD-10-CM | POA: Diagnosis not present

## 2021-04-13 DIAGNOSIS — U071 COVID-19: Secondary | ICD-10-CM | POA: Diagnosis not present

## 2021-04-14 ENCOUNTER — Other Ambulatory Visit: Payer: Medicare Other

## 2021-04-14 DIAGNOSIS — G8921 Chronic pain due to trauma: Secondary | ICD-10-CM | POA: Diagnosis not present

## 2021-04-14 DIAGNOSIS — M5459 Other low back pain: Secondary | ICD-10-CM | POA: Diagnosis not present

## 2021-04-14 DIAGNOSIS — R2689 Other abnormalities of gait and mobility: Secondary | ICD-10-CM | POA: Diagnosis not present

## 2021-04-15 ENCOUNTER — Other Ambulatory Visit: Payer: Medicare Other

## 2021-04-19 DIAGNOSIS — R2689 Other abnormalities of gait and mobility: Secondary | ICD-10-CM | POA: Diagnosis not present

## 2021-04-19 DIAGNOSIS — M5459 Other low back pain: Secondary | ICD-10-CM | POA: Diagnosis not present

## 2021-04-19 DIAGNOSIS — G8921 Chronic pain due to trauma: Secondary | ICD-10-CM | POA: Diagnosis not present

## 2021-04-26 DIAGNOSIS — M5459 Other low back pain: Secondary | ICD-10-CM | POA: Diagnosis not present

## 2021-04-26 DIAGNOSIS — G8921 Chronic pain due to trauma: Secondary | ICD-10-CM | POA: Diagnosis not present

## 2021-04-26 DIAGNOSIS — R2689 Other abnormalities of gait and mobility: Secondary | ICD-10-CM | POA: Diagnosis not present

## 2021-05-04 DIAGNOSIS — G8921 Chronic pain due to trauma: Secondary | ICD-10-CM | POA: Diagnosis not present

## 2021-05-04 DIAGNOSIS — M5459 Other low back pain: Secondary | ICD-10-CM | POA: Diagnosis not present

## 2021-05-04 DIAGNOSIS — R2689 Other abnormalities of gait and mobility: Secondary | ICD-10-CM | POA: Diagnosis not present

## 2021-05-10 DIAGNOSIS — R2689 Other abnormalities of gait and mobility: Secondary | ICD-10-CM | POA: Diagnosis not present

## 2021-05-10 DIAGNOSIS — M5459 Other low back pain: Secondary | ICD-10-CM | POA: Diagnosis not present

## 2021-05-10 DIAGNOSIS — G8921 Chronic pain due to trauma: Secondary | ICD-10-CM | POA: Diagnosis not present

## 2021-05-23 NOTE — Progress Notes (Signed)
Kelli [facility] [employer] [address] [place] Phone: [phone] Subjective:   [name], am serving as a scribe for Dr. Hulan Saas. This visit occurred during the SARS-CoV-2 public health emergency.  Safety protocols were in place, including screening questions prior to the visit, additional usage of staff PPE, and extensive cleaning of exam room while observing appropriate contact time as indicated for disinfecting solutions.   I'm seeing this patient by the request  of:  [name], MD  CC: Pain, neck pain follow-up  [id number]  04/07/2021 Left hip pain appears to be more after the MRI of the hamstring tendinitis with partial tearing as well as being potential stress reaction of the ischial tuberosity.  Hopefully patient will continue to report improvement that she is using the moment.  Discussed with patient potentially trying to find compression sleeve with Velcro.  Discussed with patient to continue with formal physical therapy goals discussed which activities to potentially avoid such as full extension noted.  Patient does have back MRI as well showing some degenerative disc disease, foraminal narrowing and moderate spinal stenosis but would not be consistent with the radicular symptoms that she is having at this time.  Patient will follow up with me again in 4 to 6 weeks.  Total time reviewing patient's MRI, discussing with her as well as her husband about the treatment options 33 minutes  Update 05/24/2021 Kelli Moss is a 70 y.o. female coming in with complaint of L hamstring pain. Patient states making progress. No new symptoms and the pain seems to be less often. When there is pain it is in the same spot and feels the same.       Past Medical History:  Diagnosis Date   Colon polyp    CTS (carpal tunnel syndrome)    Depression    Diabetes mellitus without complication ([place])    prediabetes   Dyslipidemia    Gastritis     Hypertension    Neuromuscular disorder ([place])    peripheral neuropathy   No past surgical history on file. Social History   Socioeconomic History   Marital status: Married    Spouse name: Not on file   Number of children: Not on file   Years of education: Not on file   Highest education level: Not on file  Occupational History   Not on file  Tobacco Use   Smoking status: Former    Types: Cigarettes    Quit date: 01/23/1988    Years since quitting: 33.3   Smokeless tobacco: Never  Substance and Sexual Activity   Alcohol use: No   Drug use: No   Sexual activity: Not on file  Other Topics Concern   Not on file  Social History Narrative   Not on file   Social Determinants of Health   Financial Resource Strain: Not on file  Food Insecurity: Not on file  Transportation Needs: Not on file  Physical Activity: Not on file  Stress: Not on file  Social Connections: Not on file   Allergies  Allergen Reactions   Lyrica [Pregabalin]     nightmares   No family history on file.  Current Outpatient Medications (Endocrine & Metabolic):    predniSONE (DELTASONE) 20 MG tablet, Take 1 tablet (20 mg total) by mouth daily with breakfast.  Current Outpatient Medications (Cardiovascular):    amLODipine (NORVASC) 10 MG tablet, Take 1 tablet (10 mg total) by mouth daily.   hydrochlorothiazide (MICROZIDE)  12.5 MG capsule, Take 1 capsule (12.5 mg total) by mouth daily.   losartan (COZAAR) 100 MG tablet, Take 1 tablet (100 mg total) by mouth daily.   metoprolol succinate (TOPROL-XL) 50 MG 24 hr tablet, Take 1 tablet (50 mg total) by mouth daily. Take with or immediately following a meal.   omega-3 acid ethyl esters (LOVAZA) 1 g capsule, Take 1 capsule (1 g total) by mouth 2 (two) times daily.  Current Outpatient Medications (Respiratory):    fluticasone (FLONASE) 50 MCG/ACT nasal spray, Place into both nostrils daily.  Current Outpatient Medications (Analgesics):    aspirin 81 MG  tablet, Take 81 mg by mouth daily.   meloxicam (MOBIC) 7.5 MG tablet, Take 1 tablet (7.5 mg total) by mouth daily.  Current Outpatient Medications (Hematological):    cyanocobalamin (,VITAMIN B-12,) 1000 MCG/ML injection, INJECT 1 ML IN THE MUSCLE ONCE MONTHLY  Current Outpatient Medications (Other):    ammonium lactate (AMLACTIN) 12 % cream, Apply topically as needed for dry skin.   CINNAMON PO, Take 2,000 mg by mouth daily.   diazepam (VALIUM) 10 MG tablet, TAKE 1 TABLET BY MOUTH EVERY 8 HOURS AS NEEDED FOR MUSCLE SPASM   Efinaconazole 10 % SOLN, As directed for toenail fungus   Flaxseed, Linseed, (FLAX SEEDS PO), Take 1 tablet by mouth daily.   Grape Seed 100 MG CAPS, Take by mouth.   LORazepam (ATIVAN) 1 MG tablet, Take 1 tablet (1 mg total) by mouth at bedtime as needed.   Magnesium 500 MG TABS, Take 500 mg by mouth.   Misc Natural Products (BLACK CHERRY CONCENTRATE PO), Take by mouth.   Polyethyl Glycol-Propyl Glycol (SYSTANE OP), Apply to eye.   tiZANidine (ZANAFLEX) 4 MG tablet, TAKE ONE TABLET BY MOUTH EVERY 8 HOURS AS NEEDED FOR  MUSCLE  SPASMS   TURMERIC PO, Take 1,000 mg by mouth.   UNABLE TO FIND, Med Name: TENS UNIT DX: M54.2; M54.9   Vitamin D, Ergocalciferol, (DRISDOL) 1.25 MG (50000 UNIT) CAPS capsule, Take 1 capsule (50,000 Units total) by mouth every 7 (seven) days.   Reviewed prior external information including notes and imaging from  primary care provider As well as notes that were available from care everywhere and other healthcare systems.  Past medical history, social, surgical and family history all reviewed in electronic medical record.  No pertanent information unless stated regarding to the chief complaint.   Review of Systems:  No headache, visual changes, nausea, vomiting, diarrhea, constipation, dizziness, abdominal pain, skin rash, fevers, chills, night sweats, weight loss, swollen lymph nodes, body aches, joint swelling, chest pain, shortness of breath,  mood changes. POSITIVE muscle aches  Objective  Blood pressure 140/62, pulse 82, height 5\' 7"  (1.702 m), weight 259 lb (117.5 kg), SpO2 96 %.   General: No apparent distress alert and oriented x3 mood and affect normal, dressed appropriately.  HEENT: Pupils equal, extraocular movements intact  Respiratory: Patient's speak in full sentences and does not appear short of breath  Cardiovascular: No lower extremity edema, non tender, no erythema  Gait normal with good balance and coordination.  MSK: Patient is able to sit more comfortably at this time.  Still has tightness of the hamstring on the left compared to right.  Patient still favors the left side.  Normal walking no noted.  Neurovascular intact distally.  Neck exam does have loss of lordosis.  No difficulty with range of motion    Impression and Recommendations:     The above documentation has  been reviewed and is accurate and complete Lyndal Pulley, DO

## 2021-05-24 ENCOUNTER — Other Ambulatory Visit: Payer: Self-pay

## 2021-05-24 ENCOUNTER — Ambulatory Visit (INDEPENDENT_AMBULATORY_CARE_PROVIDER_SITE_OTHER): Payer: Medicare Other | Admitting: Family Medicine

## 2021-05-24 ENCOUNTER — Ambulatory Visit: Payer: Self-pay

## 2021-05-24 VITALS — BP 140/62 | HR 82 | Ht 67.0 in | Wt 259.0 lb

## 2021-05-24 DIAGNOSIS — M25552 Pain in left hip: Secondary | ICD-10-CM

## 2021-05-24 DIAGNOSIS — M5030 Other cervical disc degeneration, unspecified cervical region: Secondary | ICD-10-CM

## 2021-05-24 NOTE — Assessment & Plan Note (Signed)
Patient does have a hamstring tear but is making significant progress.  I do believe the patient will continue to make improvement.  The patient does have anti-inflammatories such as meloxicam for any breakthrough.  Continue the home exercises and patient can discontinue physical therapy.  We will see patient back again in 6 weeks and at that time we will hopefully release patient from the hamstring and focus more on her neck.

## 2021-05-24 NOTE — Patient Instructions (Signed)
Good to see you! Making great progress Ok to do home exercises and not PT See you again in in 6 weeks where we can hopefully close out on the hamstring

## 2021-05-24 NOTE — Assessment & Plan Note (Signed)
Patient has had x-rays of the neck done previously and did show the patient does have some anterior bridging with some degenerative disc disease.  Do feel that this will need further evaluation.  At this moment though patient wants to focus on getting the imaging done and then we will focus on the neck on a more regular basis.

## 2021-06-28 ENCOUNTER — Ambulatory Visit
Admission: EM | Admit: 2021-06-28 | Discharge: 2021-06-28 | Disposition: A | Discharge status: Home / Self Care | Payer: Medicare Other | Attending: Emergency Medicine | Admitting: Emergency Medicine

## 2021-06-28 ENCOUNTER — Other Ambulatory Visit: Payer: Self-pay

## 2021-06-28 ENCOUNTER — Encounter: Payer: Self-pay | Admitting: Emergency Medicine

## 2021-06-28 DIAGNOSIS — J101 Influenza due to other identified influenza virus with other respiratory manifestations: Secondary | ICD-10-CM | POA: Diagnosis not present

## 2021-06-28 LAB — POCT INFLUENZA A/B
Influenza A, POC: POSITIVE — AB
Influenza B, POC: NEGATIVE

## 2021-06-28 MED ORDER — OSELTAMIVIR PHOSPHATE 75 MG PO CAPS: 75.0000 mg | ORAL_CAPSULE | Freq: Two times a day (BID) | ORAL | 0 refills | Status: DC

## 2021-06-28 MED ORDER — BENZONATATE 100 MG PO CAPS: 100.0000 mg | ORAL_CAPSULE | Freq: Three times a day (TID) | ORAL | 0 refills | Status: DC | PRN

## 2021-06-28 NOTE — Discharge Instructions (Addendum)
Take the Tamiflu as directed.  Take Tylenol  650 mg by mouth every 4 hours as needed for fever or discomfort.  Follow-up with your primary care provider if your symptoms are not improving.

## 2021-06-28 NOTE — ED Provider Notes (Signed)
[facility]    CSN: [id number] Arrival date & time: 06/28/21  1659      History   Chief Complaint Chief Complaint  Patient presents with   Cough   Shortness of Breath   Chills   Headache   Generalized Body Aches    HPI Kelli Moss is a 70 y.o. female.  Patient presents with fever, chills, body aches, headache, earache, cough, shortness of breath since yesterday.  Treatment attempted at home with pickle juice.  No rash, vomiting, diarrhea, or other symptoms.  Her medical history includes hypertension, diabetes, peripheral neuropathy.  The history is provided by the patient and medical records.   Past Medical History:  Diagnosis Date   Colon polyp    CTS (carpal tunnel syndrome)    Depression    Diabetes mellitus without complication (HCC)    prediabetes   Dyslipidemia    Gastritis    Hypertension    Neuromuscular disorder ([place])    peripheral neuropathy    Patient Active Problem List   Diagnosis Date Noted   Degenerative cervical disc 05/24/2021   Left hip pain 01/27/2021   Diabetes mellitus without complication (HCC)    Dyslipidemia    Neuromuscular disorder (HCC)    CTS (carpal tunnel syndrome)     History reviewed. No pertinent surgical history.  OB History   No obstetric history on file.      Home Medications    Prior to Admission medications   Medication Sig Start Date End Date Taking? Authorizing Provider  amLODipine (NORVASC) 10 MG tablet Take 1 tablet (10 mg total) by mouth daily. 12/05/20  Yes Essex Perry, THOMAS A  aspirin 81 MG tablet Take 81 mg by mouth daily.   Yes [provider]  benzonatate (TESSALON) 100 MG capsule Take 1 capsule (100 mg total) by mouth 3 (three) times daily as needed for cough. 06/28/21  Yes Sharion Balloon, NP  CINNAMON PO Take 2,000 mg by mouth daily.   Yes [provider]  cyanocobalamin (,VITAMIN B-12,) 1000 MCG/ML injection INJECT 1 ML IN THE MUSCLE ONCE MONTHLY 04/21/20  Yes [name],  MD  diazepam (VALIUM) 10 MG tablet TAKE 1 TABLET BY MOUTH EVERY 8 HOURS AS NEEDED FOR MUSCLE SPASM 11/02/20  Yes [name], MD  Flaxseed, Linseed, (FLAX SEEDS PO) Take 1 tablet by mouth daily.   Yes [provider]  fluticasone (FLONASE) 50 MCG/ACT nasal spray Place into both nostrils daily.   Yes [provider]  Grape Seed 100 MG CAPS Take by mouth.   Yes [provider]  hydrochlorothiazide (MICROZIDE) 12.5 MG capsule Take 1 capsule (12.5 mg total) by mouth daily. 12/05/20  Yes Brevin Mcfadden, THOMAS A  LORazepam (ATIVAN) 1 MG tablet Take 1 tablet (1 mg total) by mouth at bedtime as needed. 10/03/15  Yes [name], MD  losartan (COZAAR) 100 MG tablet Take 1 tablet (100 mg total) by mouth daily. 12/05/20  Yes Zacary Bauer, THOMAS A  Magnesium 500 MG TABS Take 500 mg by mouth.   Yes [provider]  meloxicam (MOBIC) 7.5 MG tablet Take 1 tablet (7.5 mg total) by mouth daily. 01/27/21  Yes Lyndal Pulley, DO  metoprolol succinate (TOPROL-XL) 50 MG 24 hr tablet Take 1 tablet (50 mg total) by mouth daily. Take with or immediately following a meal. 12/05/20  Yes Aaditya Letizia, THOMAS A  Misc Natural Products (BLACK CHERRY CONCENTRATE PO) Take by mouth.   Yes [provider]  omega-3 acid ethyl esters (LOVAZA) 1 g capsule Take 1 capsule (1 g total) by mouth 2 (two) times daily. 12/05/20  Yes Calvin Jablonowski, THOMAS A  tiZANidine (ZANAFLEX) 4 MG tablet TAKE ONE TABLET BY MOUTH EVERY 8 HOURS AS NEEDED FOR  MUSCLE  SPASMS 11/02/20  Yes [name], MD  TURMERIC PO Take 1,000 mg by mouth.   Yes [provider]  UNABLE TO FIND Med Name: TENS UNIT DX: M54.2; M54.9 05/27/15  Yes [name], MD  Vitamin D, Ergocalciferol, (DRISDOL) 1.25 MG (50000 UNIT) CAPS capsule Take 1 capsule (50,000 Units total) by mouth every 7 (seven) days. 04/07/21  Yes Lyndal Pulley, DO  ammonium lactate (AMLACTIN) 12 % cream Apply topically as needed for dry skin.    [provider]   Efinaconazole 10 % SOLN As directed for toenail fungus 10/03/15   [name], MD  oseltamivir (TAMIFLU) 75 MG capsule Take 1 capsule (75 mg total) by mouth every 12 (twelve) hours. 06/28/21   Sharion Balloon, NP  Polyethyl Glycol-Propyl Glycol (SYSTANE OP) Apply to eye.    [provider]  predniSONE (DELTASONE) 20 MG tablet Take 1 tablet (20 mg total) by mouth daily with breakfast. 01/27/21   Lyndal Pulley, DO    Family History History reviewed. No pertinent family history.  Social History Social History   Tobacco Use   Smoking status: Former    Types: Cigarettes    Quit date: 01/23/1988    Years since quitting: 33.4   Smokeless tobacco: Never  Vaping Use   Vaping Use: Never used  Substance Use Topics   Alcohol use: No   Drug use: No     Allergies   Lyrica [pregabalin]   Review of Systems Review of Systems  Constitutional:  Positive for chills and fever.  HENT:  Positive for ear pain. Negative for sore throat.   Respiratory:  Positive for cough and shortness of breath.   Cardiovascular:  Negative for chest pain and palpitations.  Gastrointestinal:  Negative for diarrhea and vomiting.  Skin:  Negative for color change and rash.  Neurological:  Positive for headaches. Negative for dizziness, weakness and numbness.  All other systems reviewed and are negative.   Physical Exam Triage Vital Signs ED Triage Vitals [06/28/21 1745]  Enc Vitals Group     BP (!) 144/73     Pulse Rate (!) 107     Resp      Temp 100.1 F (37.8 C)     Temp Source Oral     SpO2 94 %     Weight      Height      Head Circumference      Peak Flow      Pain Score      Pain Loc      Pain Edu?      Excl. in [place]?    No data found.  Updated Vital Signs BP (!) 144/73 (BP Location: Left Wrist)   Pulse (!) 107   Temp 100.1 F (37.8 C) (Oral)   SpO2 94%   Visual Acuity Right Eye Distance:   Left Eye Distance:   Bilateral Distance:    Right Eye Near:   Left Eye Near:     Bilateral Near:     Physical Exam Vitals and nursing note reviewed.  Constitutional:      General: She is not in acute distress.    Appearance: She is well-developed. She is obese.  HENT:     Head: Normocephalic and atraumatic.     Right Ear: Tympanic membrane normal.     Left Ear: Tympanic membrane normal.     Nose: Nose normal.     Mouth/Throat:     Mouth: Mucous membranes are moist.     Pharynx: Oropharynx is clear.  Eyes:     Conjunctiva/sclera: Conjunctivae normal.  Cardiovascular:     Rate and Rhythm: Normal rate and regular rhythm.     Heart sounds: Normal heart sounds.  Pulmonary:     Effort: Pulmonary effort is normal. No respiratory distress.     Breath sounds: Normal breath sounds.  Abdominal:     Palpations: Abdomen is soft.     Tenderness: There is no abdominal tenderness.  Musculoskeletal:     Cervical back: Neck supple.  Skin:    General: Skin is warm and dry.  Neurological:     Mental Status: She is alert.  Psychiatric:        Mood and Affect: Mood normal.        Behavior: Behavior normal.     UC Treatments / Results  Labs (all labs ordered are listed, but only abnormal results are displayed) Labs Reviewed  POCT INFLUENZA A/B - Abnormal; Notable for the following components:      Result Value   Influenza A, POC Positive (*)    All other components within normal limits    EKG   Radiology No results found.  Procedures Procedures (including critical care time)  Medications Ordered in UC Medications - No data to display  Initial Impression / Assessment and Plan / UC Course  I have reviewed the triage vital signs and the nursing notes.  Pertinent labs & imaging results that were available during my care of the patient were reviewed by me and considered in my medical decision making (see chart for details).   Influenza A.  Rapid flu test positive for influenza A.  Treating with Tamiflu.  Treating cough with Tessalon Perles.  Discussed  Tylenol as needed.  Education provided on influenza.  Instructed patient to follow-up with PCP if her symptoms are not improving.  ED precautions discussed.  She agrees to plan of care.    Final Clinical Impressions(s) / UC Diagnoses   Final diagnoses:  Influenza A     Discharge Instructions        Take the Tamiflu as directed.  Take Tylenol  650 mg by mouth every 4 hours as needed for fever or discomfort.  Follow-up with your primary care provider if your symptoms are not improving.      ED Prescriptions     Medication Sig Dispense Auth. Provider   oseltamivir (TAMIFLU) 75 MG capsule  (Status: Discontinued) Take 1 capsule (75 mg total) by mouth every 12 (twelve) hours. 10 capsule Sharion Balloon, NP   oseltamivir (TAMIFLU) 75 MG capsule Take 1 capsule (75 mg total) by mouth every 12 (twelve) hours. 10 capsule Sharion Balloon, NP   benzonatate (TESSALON) 100 MG capsule Take 1 capsule (100 mg total) by mouth 3 (three) times daily as needed for cough. 21 capsule Sharion Balloon, NP      PDMP not reviewed this encounter.   Sharion Balloon, NP 06/28/21 [id number]

## 2021-06-28 NOTE — ED Triage Notes (Signed)
Pt c/o cough, SOB, chills, HA, and bodyaches sxs started yesterday.

## 2021-06-29 ENCOUNTER — Telehealth: Payer: Self-pay | Admitting: *Deleted

## 2021-06-29 NOTE — Telephone Encounter (Signed)
Received call from patient spouse, Fritz Pickerel.   Reports that patient was seen at [facility] for illness. Dx: Flu A. Patient was given Tamiflu.   FYI

## 2021-07-05 ENCOUNTER — Ambulatory Visit: Payer: Medicare Other | Admitting: Family Medicine

## 2021-07-07 NOTE — Progress Notes (Signed)
Kelli Moss Phone: [phone] Subjective:   [name], am serving as a scribe for Dr. Hulan Saas.  This visit occurred during the SARS-CoV-2 public health emergency.  Safety protocols were in place, including screening questions prior to the visit, additional usage of staff PPE, and extensive cleaning of exam room while observing appropriate contact time as indicated for disinfecting solutions.   I'm seeing this patient by the request  of:  [name], MD  CC: Hamstring and hip pain follow-up, neck pain follow-up  [id number]  05/24/2021 Patient has had x-rays of the neck done previously and did show the patient does have some anterior bridging with some degenerative disc disease.  Do feel that this will need further evaluation.  At this moment though patient wants to focus on getting the imaging done and then we will focus on the neck on a more regular basis. Patient does have a hamstring tear but is making significant progress.  I do believe the patient will continue to make improvement.  The patient does have anti-inflammatories such as meloxicam for any breakthrough.  Continue the home exercises and patient can discontinue physical therapy.  We will see patient back again in 6 weeks and at that time we will hopefully release patient from the hamstring and focus more on her neck.  Updated 07/10/2021 Kelli Moss is a 70 y.o. female coming in with complaint of left hip and back pain. Pain has improved. Would like to focus on neck pain. Patient has tightness. Patient has hard performing AROM. Does see chiro and has had injections from dentist for TMJ. These injections did help somewhat.  Patient states that he is still having more pain on the posterior aspect of the neck low.  States that she is not able to increase any activity at all.  Patient states that she has not had improvement with range of motion  in quite some time.  Wanting to know what else can be done.  Patient did have x-rays previously which were independently visualized by me showing the patient did have calcific changes of the anterior osteophyte formation noted.  Degenerative disc disease otherwise.     Past Medical History:  Diagnosis Date   Colon polyp    CTS (carpal tunnel syndrome)    Depression    Diabetes mellitus without complication ([place])    prediabetes   Dyslipidemia    Gastritis    Hypertension    Neuromuscular disorder ([place])    peripheral neuropathy   No past surgical history on file. Social History   Socioeconomic History   Marital status: Married    Spouse name: Not on file   Number of children: Not on file   Years of education: Not on file   Highest education level: Not on file  Occupational History   Not on file  Tobacco Use   Smoking status: Former    Types: Cigarettes    Quit date: 01/23/1988    Years since quitting: 33.4   Smokeless tobacco: Never  Vaping Use   Vaping Use: Never used  Substance and Sexual Activity   Alcohol use: No   Drug use: No   Sexual activity: Not on file  Other Topics Concern   Not on file  Social History Narrative   Not on file   Social Determinants of Health   Financial Resource Strain: Not on file  Food Insecurity: Not on file  Transportation  Needs: Not on file  Physical Activity: Not on file  Stress: Not on file  Social Connections: Not on file   Allergies  Allergen Reactions   Lyrica [Pregabalin]     nightmares   No family history on file.  Current Outpatient Medications (Endocrine & Metabolic):    predniSONE (DELTASONE) 20 MG tablet, Take 1 tablet (20 mg total) by mouth daily with breakfast.  Current Outpatient Medications (Cardiovascular):    amLODipine (NORVASC) 10 MG tablet, Take 1 tablet (10 mg total) by mouth daily.   hydrochlorothiazide (MICROZIDE) 12.5 MG capsule, Take 1 capsule (12.5 mg total) by mouth daily.   losartan (COZAAR) 100  MG tablet, Take 1 tablet (100 mg total) by mouth daily.   metoprolol succinate (TOPROL-XL) 50 MG 24 hr tablet, Take 1 tablet (50 mg total) by mouth daily. Take with or immediately following a meal.   omega-3 acid ethyl esters (LOVAZA) 1 g capsule, Take 1 capsule (1 g total) by mouth 2 (two) times daily.  Current Outpatient Medications (Respiratory):    benzonatate (TESSALON) 100 MG capsule, Take 1 capsule (100 mg total) by mouth 3 (three) times daily as needed for cough.   fluticasone (FLONASE) 50 MCG/ACT nasal spray, Place into both nostrils daily.  Current Outpatient Medications (Analgesics):    aspirin 81 MG tablet, Take 81 mg by mouth daily.   meloxicam (MOBIC) 7.5 MG tablet, Take 1 tablet (7.5 mg total) by mouth daily.  Current Outpatient Medications (Hematological):    cyanocobalamin (,VITAMIN B-12,) 1000 MCG/ML injection, INJECT 1 ML IN THE MUSCLE ONCE MONTHLY  Current Outpatient Medications (Other):    ammonium lactate (AMLACTIN) 12 % cream, Apply topically as needed for dry skin.   CINNAMON PO, Take 2,000 mg by mouth daily.   diazepam (VALIUM) 10 MG tablet, TAKE 1 TABLET BY MOUTH EVERY 8 HOURS AS NEEDED FOR MUSCLE SPASM   Efinaconazole 10 % SOLN, As directed for toenail fungus   Flaxseed, Linseed, (FLAX SEEDS PO), Take 1 tablet by mouth daily.   Grape Seed 100 MG CAPS, Take by mouth.   LORazepam (ATIVAN) 1 MG tablet, Take 1 tablet (1 mg total) by mouth at bedtime as needed.   Magnesium 500 MG TABS, Take 500 mg by mouth.   Misc Natural Products (BLACK CHERRY CONCENTRATE PO), Take by mouth.   oseltamivir (TAMIFLU) 75 MG capsule, Take 1 capsule (75 mg total) by mouth every 12 (twelve) hours.   Polyethyl Glycol-Propyl Glycol (SYSTANE OP), Apply to eye.   tiZANidine (ZANAFLEX) 4 MG tablet, TAKE ONE TABLET BY MOUTH EVERY 8 HOURS AS NEEDED FOR  MUSCLE  SPASMS   TURMERIC PO, Take 1,000 mg by mouth.   UNABLE TO FIND, Med Name: TENS UNIT DX: M54.2; M54.9   Vitamin D, Ergocalciferol,  (DRISDOL) 1.25 MG (50000 UNIT) CAPS capsule, Take 1 capsule (50,000 Units total) by mouth every 7 (seven) days.     Review of Systems:  No headache, visual changes, nausea, vomiting, diarrhea, constipation, dizziness, abdominal pain, skin rash, fevers, chills, night sweats, weight loss, swollen lymph nodes, body aches, joint swelling, chest pain, shortness of breath, mood changes. POSITIVE muscle aches  Objective  Blood pressure (!) 130/58, pulse 97, height 5\' 7"  (1.702 m), weight 253 lb (114.8 kg), SpO2 97 %.   General: No apparent distress alert and oriented x3 mood and affect normal, dressed appropriately.  Overweight HEENT: Pupils equal, extraocular movements intact  Respiratory: Patient's speak in full sentences and does not appear short of breath  Cardiovascular:  No lower extremity edema, non tender, no erythema  Gait mild antalgic Patient's neck exam does have severe loss of lordosis.  Patient has limited sidebending bilaterally.  He does have fullness of the neck noted.  Severe tightness noted in the occipital region.  Only has 5 degrees of extension and 15 degrees of flexion.    Impression and Recommendations:     The above documentation has been reviewed and is accurate and complete Lyndal Pulley, DO

## 2021-07-10 ENCOUNTER — Encounter: Payer: Self-pay | Admitting: Family Medicine

## 2021-07-10 ENCOUNTER — Ambulatory Visit (INDEPENDENT_AMBULATORY_CARE_PROVIDER_SITE_OTHER): Payer: Medicare Other | Admitting: Family Medicine

## 2021-07-10 ENCOUNTER — Other Ambulatory Visit: Payer: Self-pay

## 2021-07-10 VITALS — BP 130/58 | HR 97 | Ht 67.0 in | Wt 253.0 lb

## 2021-07-10 DIAGNOSIS — M542 Cervicalgia: Secondary | ICD-10-CM | POA: Diagnosis not present

## 2021-07-10 DIAGNOSIS — M25552 Pain in left hip: Secondary | ICD-10-CM | POA: Diagnosis not present

## 2021-07-10 DIAGNOSIS — M5030 Other cervical disc degeneration, unspecified cervical region: Secondary | ICD-10-CM | POA: Diagnosis not present

## 2021-07-10 NOTE — Patient Instructions (Signed)
Scapular exercises MRI cervical spine [phone] 2 Tennis balls in a tube sock See me again in 8 weeks no matter what

## 2021-07-10 NOTE — Assessment & Plan Note (Signed)
Hamstring is doing significantly better.  Still has some tightness.  Discussed with patient to slowly increase activity.  Patient should continue to make improvement.  Follow-up with me again in 6 to 8 weeks

## 2021-07-10 NOTE — Assessment & Plan Note (Signed)
Patient has had significant arthritic changes of the neck.  Having increasing limited range of motion.  Significant tightness noted of the musculature surrounding the area.  Recommend patient could continue to take the meloxicam but has not noticed significant improvement.  Patient has muscle relaxants.  Patient did not respond well to the Cymbalta and did not notice any improvement.  Failed physical therapy at this point I do feel advanced imaging is warranted.  We will get MRI to further evaluate and see if patient is a candidate for possible epidurals.  Follow-up with me again in 6 weeks otherwise.  Chronic problem with exacerbation.

## 2021-08-06 ENCOUNTER — Ambulatory Visit
Admission: RE | Admit: 2021-08-06 | Discharge: 2021-08-06 | Disposition: A | Discharge status: Home / Self Care | Payer: Medicare Other | Source: Ambulatory Visit | Attending: Family Medicine | Admitting: Family Medicine

## 2021-08-06 DIAGNOSIS — M2578 Osteophyte, vertebrae: Secondary | ICD-10-CM | POA: Diagnosis not present

## 2021-08-06 DIAGNOSIS — M542 Cervicalgia: Secondary | ICD-10-CM

## 2021-08-06 DIAGNOSIS — M4802 Spinal stenosis, cervical region: Secondary | ICD-10-CM | POA: Diagnosis not present

## 2021-08-07 ENCOUNTER — Telehealth: Payer: Self-pay | Admitting: Cardiovascular Disease

## 2021-08-07 NOTE — Telephone Encounter (Signed)
Patient wants to make sure that dr Claiborne Billings got her CT results from her appt in July. Please advise

## 2021-08-07 NOTE — Telephone Encounter (Signed)
Returned call to patient who was calling in regard to her CT chest that she had done back in July of this year. Patient states that she was under the impression that results were forwarded to Dr. Claiborne Billings for him to review. Advised patient that results were reviewed by her PCP but that I would forward message to Dr. Claiborne Billings to see if he can review the results and let her know of any recommendations that Dr. Claiborne Billings may have. Patient verbalized understanding.

## 2021-08-24 NOTE — Telephone Encounter (Signed)
CT scan demonstrated a stable 3 mm nodule in the right middle lung felt to be benign.  Patient was also noted to have aortic atherosclerosis.  It would be important therefore to make certain her cholesterol is well controlled.

## 2021-08-25 NOTE — Telephone Encounter (Signed)
Called patient, LVM to call back to discuss results of Chest CT. Left call back number.

## 2021-08-28 ENCOUNTER — Ambulatory Visit (INDEPENDENT_AMBULATORY_CARE_PROVIDER_SITE_OTHER): Payer: Medicare Other | Admitting: Nurse Practitioner

## 2021-08-28 ENCOUNTER — Telehealth: Payer: Self-pay

## 2021-08-28 ENCOUNTER — Encounter: Payer: Self-pay | Admitting: Nurse Practitioner

## 2021-08-28 ENCOUNTER — Other Ambulatory Visit: Payer: Self-pay

## 2021-08-28 VITALS — BP 158/78 | HR 97 | Ht 67.0 in | Wt 260.0 lb

## 2021-08-28 DIAGNOSIS — R300 Dysuria: Secondary | ICD-10-CM | POA: Diagnosis not present

## 2021-08-28 LAB — URINALYSIS, ROUTINE W REFLEX MICROSCOPIC
Bacteria, UA: NONE SEEN /HPF
Bilirubin Urine: NEGATIVE
Glucose, UA: NEGATIVE
Hgb urine dipstick: NEGATIVE
Hyaline Cast: NONE SEEN /LPF
Ketones, ur: NEGATIVE
Nitrite: NEGATIVE
Protein, ur: NEGATIVE
RBC / HPF: NONE SEEN /HPF (ref 0–2)
Specific Gravity, Urine: 1 — ABNORMAL LOW (ref 1.001–1.035)
pH: 6 (ref 5.0–8.0)

## 2021-08-28 LAB — MICROSCOPIC MESSAGE

## 2021-08-28 MED ORDER — PHENAZOPYRIDINE HCL 100 MG PO TABS: 100.0000 mg | ORAL_TABLET | Freq: Two times a day (BID) | ORAL | 0 refills | Status: AC | PRN

## 2021-08-28 MED ORDER — CEPHALEXIN 500 MG PO CAPS: 500.0000 mg | ORAL_CAPSULE | Freq: Four times a day (QID) | ORAL | 0 refills | Status: DC

## 2021-08-28 NOTE — Telephone Encounter (Signed)
Pt called in wanting to know if dr would send an antibiotic as pt took an at home urine test that showed pt was positive for a UTI. Please advise.  Cb#: [phone]

## 2021-08-28 NOTE — Telephone Encounter (Signed)
Spoke with pt and she reports urinary urgency/frequency since last week. Pt also reports some discomfort with urination. Pt states she took an Azo home UTI test and it showed possible UTI.  Pt scheduled for this afternoon with Janett Billow.

## 2021-08-28 NOTE — Telephone Encounter (Signed)
Spoke with Malachy Mood - aware Dr. Claiborne Billings has reviewed chest CT from July 2022

## 2021-08-28 NOTE — Progress Notes (Signed)
Subjective:    Patient ID: Kelli Moss, female    DOB: [birth year], 71 y.o.   MRN: [id number]  HPI: Kelli Moss is a 71 y.o. female presenting for increased urinary frequency and dysuria.  Chief Complaint  Patient presents with   Dysuria   URINARY SYMPTOMS Duration: days Dysuria: burning Urinary frequency: yes Urgency: yes Small volume voids: yes Symptom severity: moderate Urinary incontinence: no Foul odor: no Hematuria: no Abdominal pain:  yes, pressure Back pain: no Suprapubic pain/pressure: yes Flank pain: no Fever:  no Nausea: no Vomiting: no Relief with cranberry juice: no Relief with pyridium: no Status: stable Previous urinary tract infection: yes Recurrent urinary tract infection: no Sexual activity: not sexually active currently History of sexually transmitted disease: no Vaginal discharge: no Treatments attempted:  increased fluids   Allergies  Allergen Reactions   Lyrica [Pregabalin]     nightmares    Outpatient Encounter Medications as of 08/28/2021  Medication Sig   amLODipine (NORVASC) 10 MG tablet Take 1 tablet (10 mg total) by mouth daily.   ammonium lactate (AMLACTIN) 12 % cream Apply topically as needed for dry skin.   aspirin 81 MG tablet Take 81 mg by mouth daily.   cephALEXin (KEFLEX) 500 MG capsule Take 1 capsule (500 mg total) by mouth every 6 (six) hours.   CINNAMON PO Take 2,000 mg by mouth daily.   cyanocobalamin (,VITAMIN B-12,) 1000 MCG/ML injection INJECT 1 ML IN THE MUSCLE ONCE MONTHLY   diazepam (VALIUM) 10 MG tablet TAKE 1 TABLET BY MOUTH EVERY 8 HOURS AS NEEDED FOR MUSCLE SPASM   Efinaconazole 10 % SOLN As directed for toenail fungus   Flaxseed, Linseed, (FLAX SEEDS PO) Take 1 tablet by mouth daily.   fluticasone (FLONASE) 50 MCG/ACT nasal spray Place into both nostrils daily.   Grape Seed 100 MG CAPS Take by mouth.   hydrochlorothiazide (MICROZIDE) 12.5 MG capsule Take 1 capsule (12.5 mg total) by mouth daily.    LORazepam (ATIVAN) 1 MG tablet Take 1 tablet (1 mg total) by mouth at bedtime as needed.   losartan (COZAAR) 100 MG tablet Take 1 tablet (100 mg total) by mouth daily.   Magnesium 500 MG TABS Take 500 mg by mouth.   meloxicam (MOBIC) 7.5 MG tablet Take 1 tablet (7.5 mg total) by mouth daily.   metoprolol succinate (TOPROL-XL) 50 MG 24 hr tablet Take 1 tablet (50 mg total) by mouth daily. Take with or immediately following a meal.   Misc Natural Products (BLACK CHERRY CONCENTRATE PO) Take by mouth.   omega-3 acid ethyl esters (LOVAZA) 1 g capsule Take 1 capsule (1 g total) by mouth 2 (two) times daily.   phenazopyridine (PYRIDIUM) 100 MG tablet Take 1 tablet (100 mg total) by mouth 2 (two) times daily as needed for pain (urinray pain).   Polyethyl Glycol-Propyl Glycol (SYSTANE OP) Apply to eye.   tiZANidine (ZANAFLEX) 4 MG tablet TAKE ONE TABLET BY MOUTH EVERY 8 HOURS AS NEEDED FOR  MUSCLE  SPASMS   TURMERIC PO Take 1,000 mg by mouth.   UNABLE TO FIND Med Name: TENS UNIT DX: M54.2; M54.9   Vitamin D, Ergocalciferol, (DRISDOL) 1.25 MG (50000 UNIT) CAPS capsule Take 1 capsule (50,000 Units total) by mouth every 7 (seven) days.   [DISCONTINUED] benzonatate (TESSALON) 100 MG capsule Take 1 capsule (100 mg total) by mouth 3 (three) times daily as needed for cough.   [DISCONTINUED] oseltamivir (TAMIFLU) 75 MG capsule Take 1 capsule (75 mg  total) by mouth every 12 (twelve) hours.   [DISCONTINUED] predniSONE (DELTASONE) 20 MG tablet Take 1 tablet (20 mg total) by mouth daily with breakfast.   No facility-administered encounter medications on file as of 08/28/2021.    Patient Active Problem List   Diagnosis Date Noted   Degenerative cervical disc 05/24/2021   Left hip pain 01/27/2021   Diabetes mellitus without complication (HCC)    Dyslipidemia    Neuromuscular disorder ([place])    CTS (carpal tunnel syndrome)     Past Medical History:  Diagnosis Date   Colon polyp    CTS (carpal tunnel syndrome)     Depression    Diabetes mellitus without complication ([place])    prediabetes   Dyslipidemia    Gastritis    Hypertension    Neuromuscular disorder ([place])    peripheral neuropathy    Relevant past medical, surgical, family and social history reviewed and updated as indicated. Interim medical history since our last visit reviewed.  Review of Systems Per HPI unless specifically indicated above     Objective:    BP (!) 158/78    Pulse 97    Ht 5\' 7"  (1.702 m)    Wt 260 lb (117.9 kg)    SpO2 98%    BMI 40.72 kg/m   Wt Readings from Last 3 Encounters:  08/28/21 260 lb (117.9 kg)  07/10/21 253 lb (114.8 kg)  05/24/21 259 lb (117.5 kg)    Physical Exam Vitals and nursing note reviewed.  Constitutional:      General: She is not in acute distress.    Appearance: Normal appearance. She is obese. She is not toxic-appearing.  Abdominal:     General: Abdomen is flat. Bowel sounds are normal. There is no distension.     Palpations: Abdomen is soft. There is no mass.     Tenderness: There is no abdominal tenderness. There is no right CVA tenderness, left CVA tenderness or guarding.  Skin:    General: Skin is warm and dry.     Capillary Refill: Capillary refill takes less than 2 seconds.     Coloration: Skin is not jaundiced or pale.     Findings: No erythema.  Neurological:     Mental Status: She is alert and oriented to person, place, and time.     Motor: No weakness.     Gait: Gait normal.  Psychiatric:        Mood and Affect: Mood normal.        Behavior: Behavior normal.        Thought Content: Thought content normal.        Judgment: Judgment normal.      Assessment & Plan:  1. Dysuria Acute. UA today shows 2+ leukocyte esterase, 0-5 WBC, 0-5 squamous epithelial cells.  Specific gravity 1.000.  Suspect very dilute urine.  Send urine for culture and in meantime, treat with cephalexin 500 mg every 6 hours for 5 days.  Also start pyridium for urinary pain - discussed that this  turns urine bright orange.  Follow up with no improvement in symptoms.  If symptoms worsen or with nausea/vomiting and unable to keep fluids down, go to ER.   - Urinalysis, Routine w reflex microscopic - Urine Culture - cephALEXin (KEFLEX) 500 MG capsule; Take 1 capsule (500 mg total) by mouth every 6 (six) hours.  Dispense: 20 capsule; Refill: 0 - phenazopyridine (PYRIDIUM) 100 MG tablet; Take 1 tablet (100 mg total) by mouth 2 (two) times  daily as needed for pain (urinray pain).  Dispense: 20 tablet; Refill: 0   Follow up plan: Return if symptoms worsen or fail to improve.

## 2021-08-28 NOTE — Telephone Encounter (Signed)
Follow Up:      Patient is returning Julie's call from today. 

## 2021-08-29 LAB — URINE CULTURE
MICRO NUMBER:: 12845074
Result:: NO GROWTH
SPECIMEN QUALITY:: ADEQUATE

## 2021-08-29 NOTE — Progress Notes (Signed)
[facility] Phone: [phone] Subjective:   [name], am serving as a scribe for Dr. Hulan Saas. This visit occurred during the SARS-CoV-2 public health emergency.  Safety protocols were in place, including screening questions prior to the visit, additional usage of staff PPE, and extensive cleaning of exam room while observing appropriate contact time as indicated for disinfecting solutions.  I'm seeing this patient by the request  of:  [name], MD  CC: Neck pain follow-up  [id number]  07/10/2021 Patient has had significant arthritic changes of the neck.  Having increasing limited range of motion.  Significant tightness noted of the musculature surrounding the area.  Recommend patient could continue to take the meloxicam but has not noticed significant improvement.  Patient has muscle relaxants.  Patient did not respond well to the Cymbalta and did not notice any improvement.  Failed physical therapy at this point I do feel advanced imaging is warranted.  We will get MRI to further evaluate and see if patient is a candidate for possible epidurals.  Follow-up with me again in 6 weeks otherwise.  Chronic problem with exacerbation.  Update 08/30/2021 BRIGET SHAHEED is a 71 y.o. female coming in with complaint of neck and L hip pain. Left hip is feeling better. Patient states that her pain is worse after having to lie down for MRI.   MRI Cervical 12/182022 IMPRESSION: 1. 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 The CSF surrounding the cord is partially effaced at C5-6 and C6-7, but there is no cord deformity or abnormal cord signal. 2. Mild foraminal narrowing bilaterally at C5-6 and on the right at C6-7. No high-grade spinal stenosis or definite nerve root encroachment. 3. No apparent acute findings.     Past Medical History:   Diagnosis Date   Colon polyp    CTS (carpal tunnel syndrome)    Depression    Diabetes mellitus without complication ([place])    prediabetes   Dyslipidemia    Gastritis    Hypertension    Neuromuscular disorder ([place])    peripheral neuropathy   No past surgical history on file. Social History   Socioeconomic History   Marital status: Married    Spouse name: Not on file   Number of children: Not on file   Years of education: Not on file   Highest education level: Not on file  Occupational History   Not on file  Tobacco Use   Smoking status: Former    Types: Cigarettes    Quit date: 01/23/1988    Years since quitting: 33.6   Smokeless tobacco: Never  Vaping Use   Vaping Use: Never used  Substance and Sexual Activity   Alcohol use: No   Drug use: No   Sexual activity: Not on file  Other Topics Concern   Not on file  Social History Narrative   Not on file   Social Determinants of Health   Financial Resource Strain: Not on file  Food Insecurity: Not on file  Transportation Needs: Not on file  Physical Activity: Not on file  Stress: Not on file  Social Connections: Not on file   Allergies  Allergen Reactions   Lyrica [Pregabalin]     nightmares   No family history on file.   Current Outpatient Medications (Cardiovascular):    amLODipine (NORVASC) 10 MG tablet, Take 1 tablet (10 mg total) by mouth daily.  hydrochlorothiazide (MICROZIDE) 12.5 MG capsule, Take 1 capsule (12.5 mg total) by mouth daily.   losartan (COZAAR) 100 MG tablet, Take 1 tablet (100 mg total) by mouth daily.   metoprolol succinate (TOPROL-XL) 50 MG 24 hr tablet, Take 1 tablet (50 mg total) by mouth daily. Take with or immediately following a meal.   omega-3 acid ethyl esters (LOVAZA) 1 g capsule, Take 1 capsule (1 g total) by mouth 2 (two) times daily.  Current Outpatient Medications (Respiratory):    fluticasone (FLONASE) 50 MCG/ACT nasal spray, Place into both nostrils daily.  Current  Outpatient Medications (Analgesics):    aspirin 81 MG tablet, Take 81 mg by mouth daily.   meloxicam (MOBIC) 7.5 MG tablet, Take 1 tablet (7.5 mg total) by mouth daily.  Current Outpatient Medications (Hematological):    cyanocobalamin (,VITAMIN B-12,) 1000 MCG/ML injection, INJECT 1 ML IN THE MUSCLE ONCE MONTHLY  Current Outpatient Medications (Other):    ammonium lactate (AMLACTIN) 12 % cream, Apply topically as needed for dry skin.   cephALEXin (KEFLEX) 500 MG capsule, Take 1 capsule (500 mg total) by mouth every 6 (six) hours.   CINNAMON PO, Take 2,000 mg by mouth daily.   diazepam (VALIUM) 10 MG tablet, TAKE 1 TABLET BY MOUTH EVERY 8 HOURS AS NEEDED FOR MUSCLE SPASM   Efinaconazole 10 % SOLN, As directed for toenail fungus   Flaxseed, Linseed, (FLAX SEEDS PO), Take 1 tablet by mouth daily.   Grape Seed 100 MG CAPS, Take by mouth.   LORazepam (ATIVAN) 1 MG tablet, Take 1 tablet (1 mg total) by mouth at bedtime as needed.   Magnesium 500 MG TABS, Take 500 mg by mouth.   Misc Natural Products (BLACK CHERRY CONCENTRATE PO), Take by mouth.   phenazopyridine (PYRIDIUM) 100 MG tablet, Take 1 tablet (100 mg total) by mouth 2 (two) times daily as needed for pain (urinray pain).   Polyethyl Glycol-Propyl Glycol (SYSTANE OP), Apply to eye.   tiZANidine (ZANAFLEX) 4 MG tablet, TAKE ONE TABLET BY MOUTH EVERY 8 HOURS AS NEEDED FOR  MUSCLE  SPASMS   TURMERIC PO, Take 1,000 mg by mouth.   UNABLE TO FIND, Med Name: TENS UNIT DX: M54.2; M54.9   Vitamin D, Ergocalciferol, (DRISDOL) 1.25 MG (50000 UNIT) CAPS capsule, Take 1 capsule (50,000 Units total) by mouth every 7 (seven) days.   Reviewed prior external information including notes and imaging from  primary care provider As well as notes that were available from care everywhere and other healthcare systems.  As we discussed the MRI above.  Past medical history, social, surgical and family history all reviewed in electronic medical record.  No  pertanent information unless stated regarding to the chief complaint.   Review of Systems:  No headache, visual changes, nausea, vomiting, diarrhea, constipation, dizziness, abdominal pain, skin rash, fevers, chills, night sweats, weight loss, swollen lymph nodes,, joint swelling, chest pain, shortness of breath, mood changes. POSITIVE muscle aches, body aches  Objective  Blood pressure 130/70, pulse 86, height 5\' 7"  (1.702 m), weight 258 lb (117 kg), SpO2 96 %.   General: No apparent distress alert and oriented x3 mood and affect normal, dressed appropriately.  HEENT: Pupils equal, extraocular movements intact  Respiratory: Patient's speak in full sentences and does not appear short of breath  Cardiovascular: No lower extremity edema, non tender, no erythema  Gait mild antalgic MSK: Patient is sitting and being very careful with her neck at the moment.  Good range of motion of the  upper extremities.  Does have limited range of motion in all planes of the neck    Impression and Recommendations:    The above documentation has been reviewed and is accurate and complete Lyndal Pulley, DO -

## 2021-08-30 ENCOUNTER — Encounter: Payer: Self-pay | Admitting: Family Medicine

## 2021-08-30 ENCOUNTER — Other Ambulatory Visit: Payer: Self-pay

## 2021-08-30 ENCOUNTER — Ambulatory Visit (INDEPENDENT_AMBULATORY_CARE_PROVIDER_SITE_OTHER): Payer: Medicare Other | Admitting: Family Medicine

## 2021-08-30 VITALS — BP 130/70 | HR 86 | Ht 67.0 in | Wt 258.0 lb

## 2021-08-30 DIAGNOSIS — M542 Cervicalgia: Secondary | ICD-10-CM | POA: Diagnosis not present

## 2021-08-30 DIAGNOSIS — M5030 Other cervical disc degeneration, unspecified cervical region: Secondary | ICD-10-CM

## 2021-08-30 NOTE — Assessment & Plan Note (Signed)
Patient's MRI does show that there is some nerve root impingement especially at the C5-6 and C6-7 level.  Discussed with patient about different treatment options.  We discussed that possible injection would be most beneficial.  Patient wants to avoid any significant number of medications if possible.  We discussed icing regimen and home exercises.  Patient will be traveling.  Wants to consider getting the injection after her travels.  We will order it at this point and see how patient responds.  We will follow-up with me again 4 weeks after the injection.  Total time discussing with patient as well as significant other in looking over MRI and further notes 32 minutes today.

## 2021-08-30 NOTE — Patient Instructions (Signed)
[facility] [place] [facility] [phone] Write [place] if anything changes See me 4 weeks after injection

## 2021-09-06 ENCOUNTER — Ambulatory Visit: Payer: Medicare Other | Admitting: Family Medicine

## 2021-09-12 IMAGING — CT CT CHEST NODULE FOLLOW UP LOW DOSE W/O CM
2 of 5 series · 14 of 40 positions shown, 17 images · non-contrast
Comparison: Chest CT 03/16/2020.

CLINICAL DATA: 70-year-old female former smoker (quit 50 years ago)
with history of pulmonary nodules. Follow-up study.

EXAM:
CT CHEST WITHOUT CONTRAST
TECHNIQUE: Multidetector CT imaging of the chest was performed following the
standard protocol without IV contrast.

[Series 3: lung nodule f/u 2.00 · axial · 0.81mm/px · z∈[-1174,-932]mm · 11 of 137 slices shown, 14 images]
[im 8/137  mediastinal]
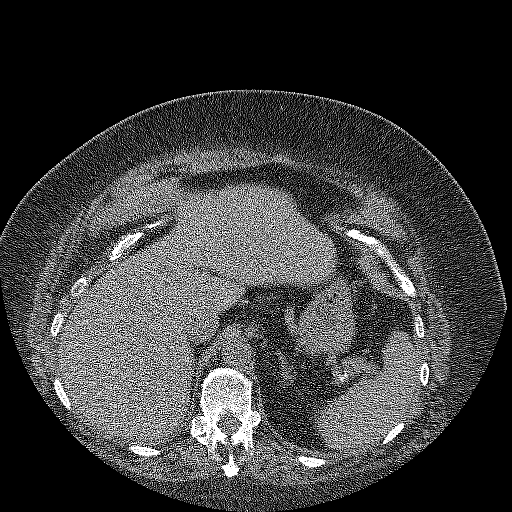
[im 8/137  lung]
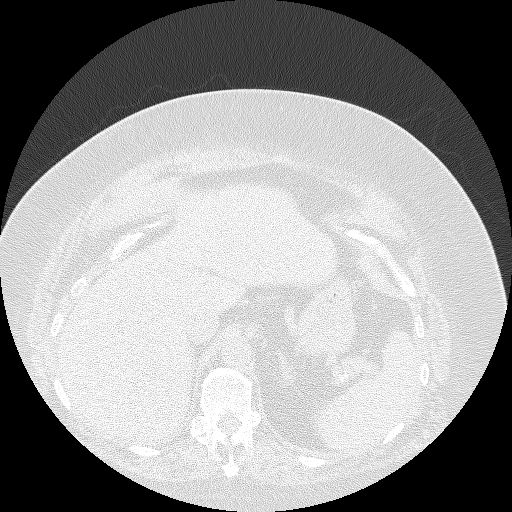
[im 23/137  lung]
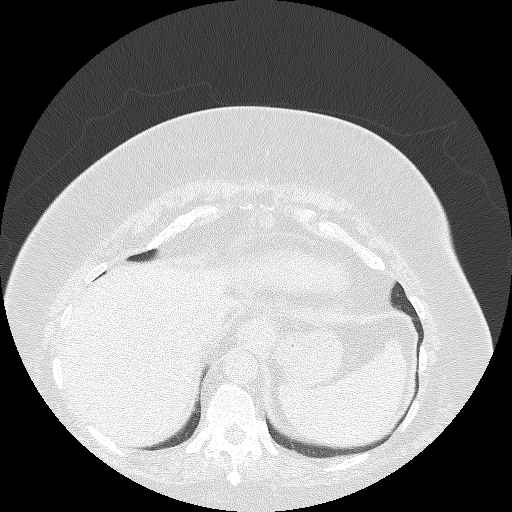
[im 31/137  lung]
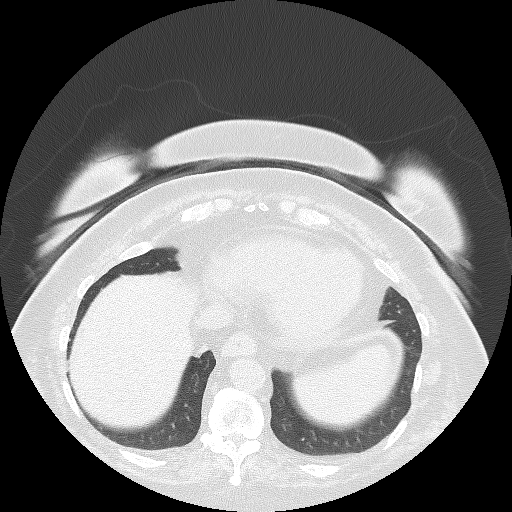
[im 46/137  lung]
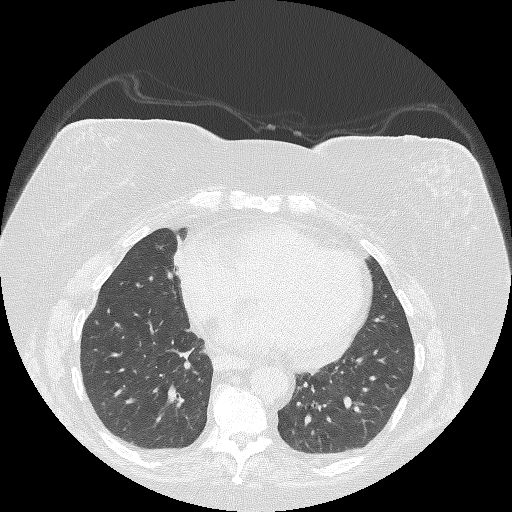
[im 53/137  mediastinal]
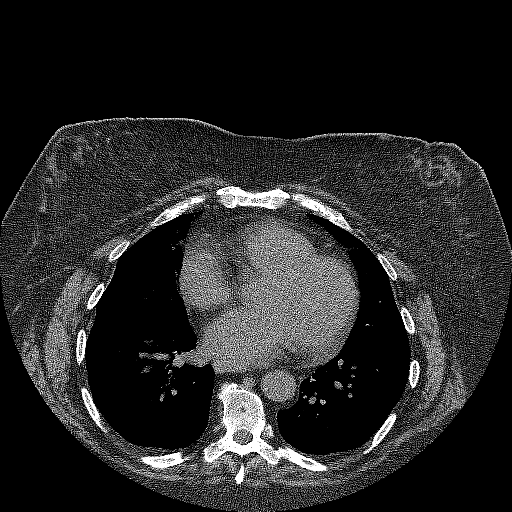
[im 53/137  lung]
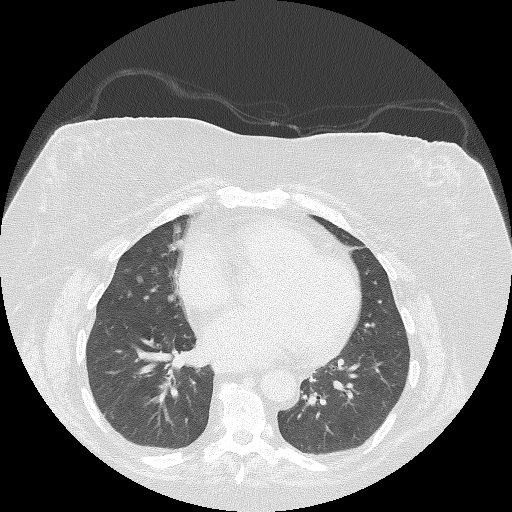
[im 69/137  lung]
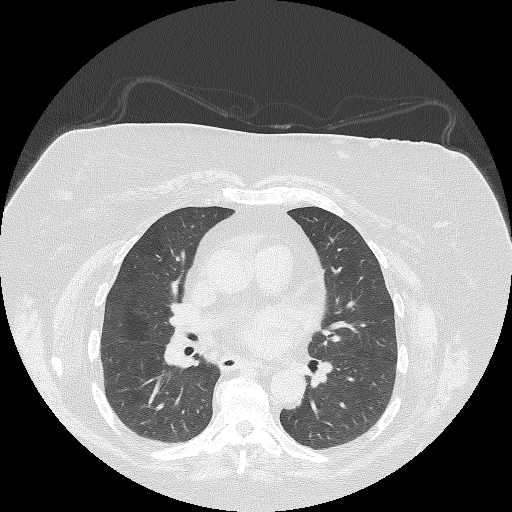
[im 84/137  lung]
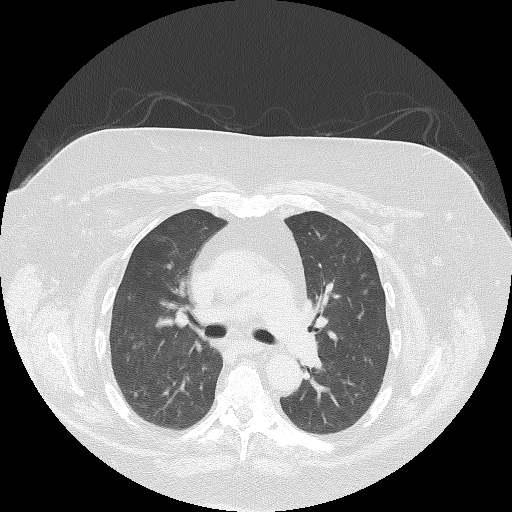
[im 91/137  lung]
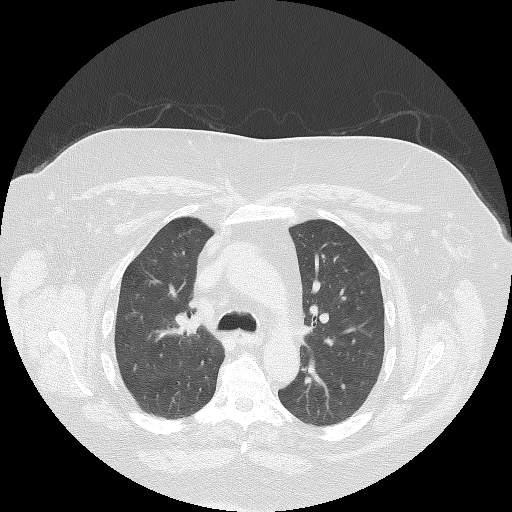
[im 106/137  mediastinal]
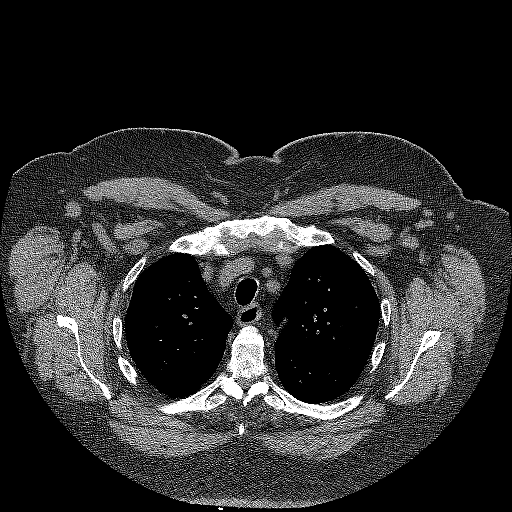
[im 106/137  lung]
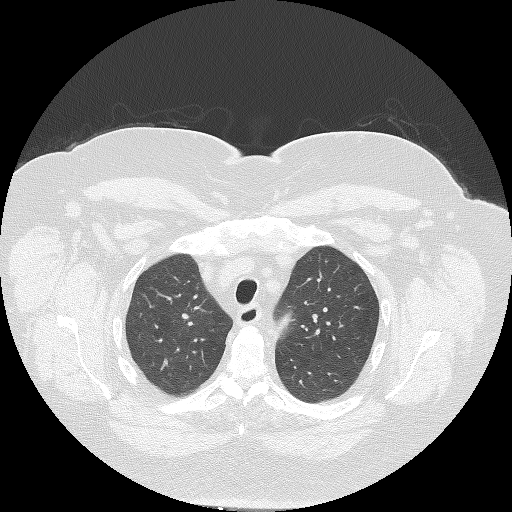
[im 114/137  lung]
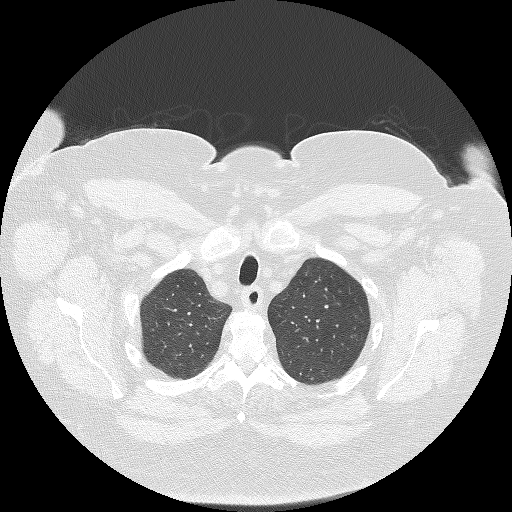
[im 129/137  lung]
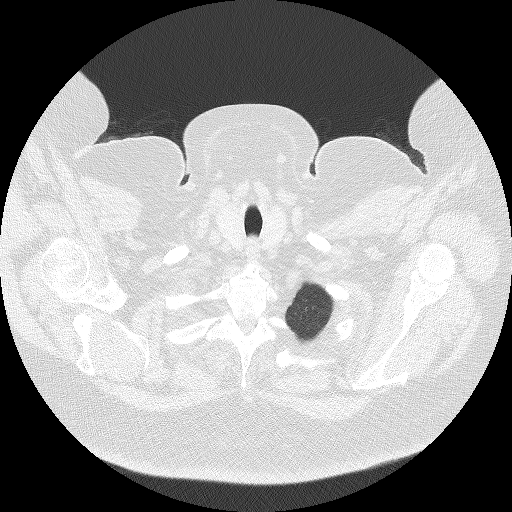

[Series 5: coronals nodule f/u 2.00 cor · coronal · 0.54mm/px · 3 of 206 slices shown]
[im 42/206  lung]
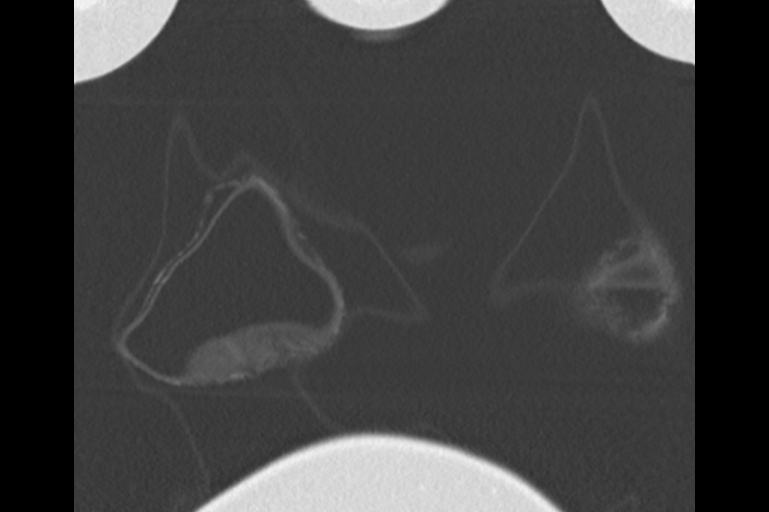
[im 83/206  lung]
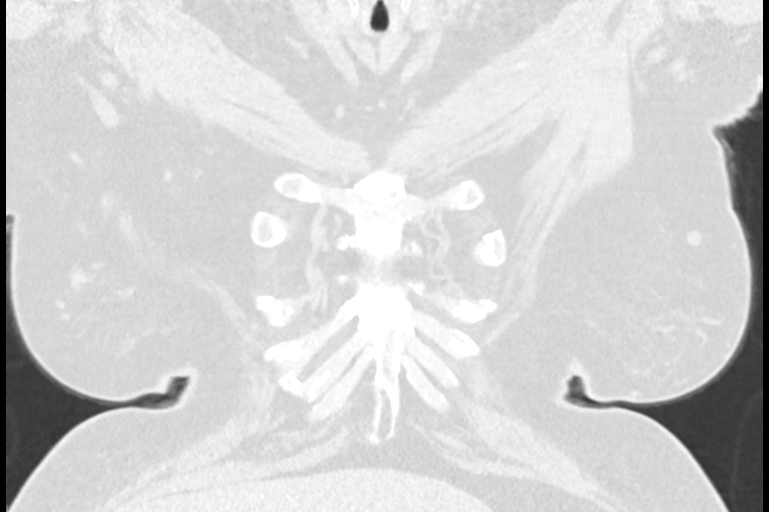
[im 124/206  lung]
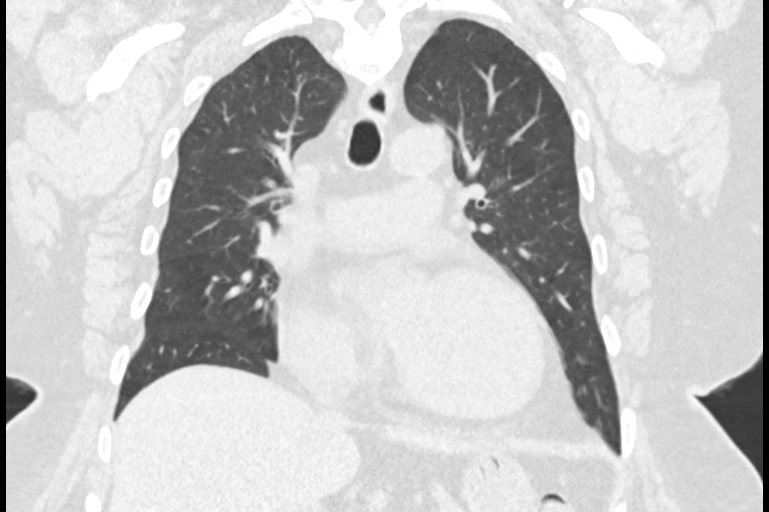

[14 of 40 positions shown; findings below may reference images not displayed]

FINDINGS: Cardiovascular: Heart size is normal. There is no significant
pericardial fluid, thickening or pericardial calcification. There is
aortic atherosclerosis, as well as atherosclerosis of the great
vessels of the mediastinum and the coronary arteries, including
calcified atherosclerotic plaque in the left anterior descending
coronary artery.

Mediastinum/Nodes: No pathologically enlarged mediastinal or hilar
lymph nodes. Please note that accurate exclusion of hilar adenopathy
is limited on noncontrast CT scans. Esophagus is unremarkable in
appearance. No axillary lymphadenopathy.

Lungs/Pleura: 3 mm nodule in the superior segment of the right lower
lobe (axial image 52 of series 3), stable compared to the prior
study, considered definitively benign. No other larger more
suspicious appearing pulmonary nodules or masses are noted. No acute
consolidative airspace disease. No pleural effusions. Mild chronic
scarring in the medial segment of the right middle lobe.

Upper Abdomen: Unremarkable.

Musculoskeletal: There are no aggressive appearing lytic or blastic
lesions noted in the visualized portions of the skeleton.
IMPRESSION: 1. 3 mm right middle lobe pulmonary nodule stable compared to the
prior study, considered definitively benign requiring no future
imaging follow-up.
2. Aortic atherosclerosis, in addition to left anterior descending
coronary artery disease. Please note that although the presence of
coronary artery calcium documents the presence of coronary artery
disease, the severity of this disease and any potential stenosis
cannot be assessed on this non-gated CT examination. Assessment for
potential risk factor modification, dietary therapy or pharmacologic
therapy may be warranted, if clinically indicated.

Aortic Atherosclerosis ([id number]-JV0.0).

## 2021-09-25 DIAGNOSIS — U071 COVID-19: Secondary | ICD-10-CM | POA: Diagnosis not present

## 2021-10-26 ENCOUNTER — Telehealth: Payer: Self-pay | Admitting: *Deleted

## 2021-10-26 ENCOUNTER — Ambulatory Visit: Payer: Medicare Other | Admitting: Podiatry

## 2021-10-26 NOTE — Chronic Care Management (AMB) (Signed)
?  Care Management  ? ?Outreach Note ? ?10/26/2021 ?Name: Kelli Moss MRN: [id number] DOB: 04-Oct-1950 ? ?Referred by: [name], MD ?Reason for referral : Care Coordination (Outreach to schedule initial call with [facility] ) ? ? ?An unsuccessful telephone outreach was attempted today. The patient was referred to the case management team for assistance with care management and care coordination.  ? ?Follow Up Plan:  ?A HIPAA compliant phone message was left for the patient providing contact information and requesting a return call.  ?The care management team will reach out to the patient again over the next 7 days. If patient returns call to provider office, please advise to call [facility] at [phone]. ? ?[name]  ?Care Guide, Embedded Care Coordination ?[place]  Care Management  ?Direct Dial: [phone] ? ?

## 2021-10-31 ENCOUNTER — Ambulatory Visit (INDEPENDENT_AMBULATORY_CARE_PROVIDER_SITE_OTHER): Payer: Medicare Other | Admitting: Podiatry

## 2021-10-31 ENCOUNTER — Encounter: Payer: Self-pay | Admitting: Podiatry

## 2021-10-31 ENCOUNTER — Other Ambulatory Visit: Payer: Self-pay

## 2021-10-31 DIAGNOSIS — M79675 Pain in left toe(s): Secondary | ICD-10-CM | POA: Diagnosis not present

## 2021-10-31 DIAGNOSIS — E119 Type 2 diabetes mellitus without complications: Secondary | ICD-10-CM | POA: Diagnosis not present

## 2021-10-31 DIAGNOSIS — L608 Other nail disorders: Secondary | ICD-10-CM | POA: Insufficient documentation

## 2021-10-31 DIAGNOSIS — M79674 Pain in right toe(s): Secondary | ICD-10-CM

## 2021-10-31 DIAGNOSIS — B351 Tinea unguium: Secondary | ICD-10-CM | POA: Diagnosis not present

## 2021-10-31 NOTE — Progress Notes (Signed)
This patient presents  to my office for at risk foot care.  This patient requires this care by a professional since this patient will be at risk due to having diabetic neuropathy.  Her neuropathy is due to pernicious anemia. She has also been diagnosed with neuromuscular disorder.  This patient is unable to cut nails herself since the patient cannot reach her nails.These nails are painful walking and wearing shoes.  This patient presents for at risk foot care today.  General Appearance  Alert, conversant and in no acute stress.  Vascular  Dorsalis pedis and posterior tibial  pulses are weakly  palpable due to swelling bilaterally.  Capillary return is within normal limits  bilaterally. Temperature is within normal limits  bilaterally.  Neurologic  Senn-Weinstein monofilament wire test absent. bilaterally. Muscle power absent. bilaterally.  Nails Thick disfigured discolored nails with subungual debris  from hallux to fifth toes bilaterally. No evidence of bacterial infection or drainage bilaterally.  Pincer nails noted.  Orthopedic  No limitations of motion  feet .  No crepitus or effusions noted.  No bony pathology or digital deformities noted.  Skin  normotropic skin with no porokeratosis noted bilaterally.  No signs of infections or ulcers noted.     Onychomycosis  Pain in right toes  Pain in left toes  Consent was obtained for treatment procedures.   Mechanical debridement of nails 1-5  bilaterally performed with a nail nipper.  Filed with dremel without incident.    Return office visit   3 months                   Told patient to return for periodic foot care and evaluation due to potential at risk complications.   Shanautica Forker DPM   

## 2021-11-02 ENCOUNTER — Other Ambulatory Visit: Payer: Medicare Other

## 2021-11-03 ENCOUNTER — Other Ambulatory Visit: Payer: Medicare Other

## 2021-11-03 ENCOUNTER — Ambulatory Visit: Payer: Medicare Other

## 2021-11-03 ENCOUNTER — Other Ambulatory Visit: Payer: Self-pay

## 2021-11-03 DIAGNOSIS — I10 Essential (primary) hypertension: Secondary | ICD-10-CM

## 2021-11-03 DIAGNOSIS — E119 Type 2 diabetes mellitus without complications: Secondary | ICD-10-CM

## 2021-11-03 DIAGNOSIS — E785 Hyperlipidemia, unspecified: Secondary | ICD-10-CM

## 2021-11-03 DIAGNOSIS — Z0000 Encounter for general adult medical examination without abnormal findings: Secondary | ICD-10-CM

## 2021-11-03 NOTE — Chronic Care Management (AMB) (Signed)
?  Care Management  ? ?Note ? ?11/03/2021 ?Name: [name] MRN: [id number] DOB: 1950/12/14 ? ?Kelli Moss is a 71 y.o. year old female who is a primary care patient of [name], MD. I reached out to Karma Greaser by phone today in response to a referral sent by [name]'s primary care provider.  ? ?[name] was given information about care management services today including:  ?Care management services include personalized support from designated clinical staff supervised by her physician, including individualized plan of care and coordination with other care providers ?24/7 contact phone numbers for assistance for urgent and routine care needs. ?The patient may stop care management services at any time by phone call to the office staff. ? ?Patient did not agree to enrollment in care management services and does not wish to consider at this time. ? ?Follow up plan: ?Patient declines further follow up and engagement by the care management team. Appropriate care team members and provider have been notified via electronic communication. The care management team is available to follow up with the patient after provider conversation with the patient regarding recommendation for care management engagement and subsequent re-referral to the care management team.  ? ?[name]  ?Care Guide, Embedded Care Coordination ?[place]  Care Management  ?Direct Dial: [phone] ? ?

## 2021-11-04 LAB — HEMOGLOBIN A1C
Hgb A1c MFr Bld: 6.3 % of total Hgb — ABNORMAL HIGH (ref <5.7)
Mean Plasma Glucose: 134 mg/dL
eAG (mmol/L): 7.4 mmol/L

## 2021-11-04 LAB — CBC WITH DIFFERENTIAL/PLATELET
Absolute Monocytes: 792 cells/uL (ref 200–950)
Basophils Absolute: 54 cells/uL (ref 0–200)
Basophils Relative: 0.6 %
Eosinophils Absolute: 180 cells/uL (ref 15–500)
Eosinophils Relative: 2 %
HCT: 41.1 % (ref 35.0–45.0)
Hemoglobin: 13.6 g/dL (ref 11.7–15.5)
Lymphs Abs: 2268 cells/uL (ref 850–3900)
MCH: 27.8 pg (ref 27.0–33.0)
MCHC: 33.1 g/dL (ref 32.0–36.0)
MCV: 84 fL (ref 80.0–100.0)
MPV: 10.2 fL (ref 7.5–12.5)
Monocytes Relative: 8.8 %
Neutro Abs: 5706 cells/uL (ref 1500–7800)
Neutrophils Relative %: 63.4 %
Platelets: 263 10*3/uL (ref 140–400)
RBC: 4.89 10*6/uL (ref 3.80–5.10)
RDW: 14.2 % (ref 11.0–15.0)
Total Lymphocyte: 25.2 %
WBC: 9 10*3/uL (ref 3.8–10.8)

## 2021-11-04 LAB — COMPREHENSIVE METABOLIC PANEL
AG Ratio: 1.5 (calc) (ref 1.0–2.5)
ALT: 18 U/L (ref 6–29)
AST: 17 U/L (ref 10–35)
Albumin: 4.2 g/dL (ref 3.6–5.1)
Alkaline phosphatase (APISO): 70 U/L (ref 37–153)
BUN/Creatinine Ratio: 23 (calc) — ABNORMAL HIGH (ref 6–22)
BUN: 13 mg/dL (ref 7–25)
CO2: 25 mmol/L (ref 20–32)
Calcium: 9.1 mg/dL (ref 8.6–10.4)
Chloride: 104 mmol/L (ref 98–110)
Creat: 0.56 mg/dL — ABNORMAL LOW (ref 0.60–1.00)
Globulin: 2.8 g/dL (calc) (ref 1.9–3.7)
Glucose, Bld: 135 mg/dL — ABNORMAL HIGH (ref 65–99)
Potassium: 3.6 mmol/L (ref 3.5–5.3)
Sodium: 140 mmol/L (ref 135–146)
Total Bilirubin: 0.7 mg/dL (ref 0.2–1.2)
Total Protein: 7 g/dL (ref 6.1–8.1)

## 2021-11-04 LAB — LIPID PANEL
Cholesterol: 155 mg/dL (ref <200)
HDL: 33 mg/dL — ABNORMAL LOW (ref >=50)
LDL Cholesterol (Calc): 84 mg/dL (calc)
Non-HDL Cholesterol (Calc): 122 mg/dL (calc) (ref <130)
Total CHOL/HDL Ratio: 4.7 (calc) (ref <5.0)
Triglycerides: 316 mg/dL — ABNORMAL HIGH (ref <150)

## 2021-11-07 ENCOUNTER — Other Ambulatory Visit: Payer: Self-pay

## 2021-11-07 ENCOUNTER — Ambulatory Visit (INDEPENDENT_AMBULATORY_CARE_PROVIDER_SITE_OTHER): Payer: Medicare Other | Admitting: Family Medicine

## 2021-11-07 ENCOUNTER — Other Ambulatory Visit: Payer: Self-pay | Admitting: Family Medicine

## 2021-11-07 ENCOUNTER — Telehealth: Payer: Self-pay | Admitting: Family Medicine

## 2021-11-07 VITALS — BP 168/62 | HR 100 | Temp 96.9°F | Ht 67.0 in | Wt 258.8 lb

## 2021-11-07 DIAGNOSIS — I10 Essential (primary) hypertension: Secondary | ICD-10-CM

## 2021-11-07 DIAGNOSIS — E8881 Metabolic syndrome: Secondary | ICD-10-CM

## 2021-11-07 DIAGNOSIS — E785 Hyperlipidemia, unspecified: Secondary | ICD-10-CM

## 2021-11-07 DIAGNOSIS — Z0000 Encounter for general adult medical examination without abnormal findings: Secondary | ICD-10-CM

## 2021-11-07 DIAGNOSIS — M5030 Other cervical disc degeneration, unspecified cervical region: Secondary | ICD-10-CM

## 2021-11-07 DIAGNOSIS — R911 Solitary pulmonary nodule: Secondary | ICD-10-CM | POA: Diagnosis not present

## 2021-11-07 DIAGNOSIS — E119 Type 2 diabetes mellitus without complications: Secondary | ICD-10-CM

## 2021-11-07 MED ORDER — OZEMPIC (0.25 OR 0.5 MG/DOSE) 2 MG/1.5ML ~~LOC~~ SOPN: 0.5000 mg | PEN_INJECTOR | SUBCUTANEOUS | 1 refills | Status: DC

## 2021-11-07 NOTE — Telephone Encounter (Signed)
Patient called to follow up on refill for omega 3. The cheapest place she found is at the [facility] pharmacy located at [place]. [address], [place], [place] [address], pharmacy number is [phone]. ? ?Please advise at [phone]. ?

## 2021-11-07 NOTE — Telephone Encounter (Signed)
Patient called to follow up on today's visit and to report to provider her blood pressure has gone down to 144/68, and 132/67. ? ?Patient also requesting letter of necessity for insurance company to cover ozempic for blood sugar managment. Please fax to [phone], attn: [facility] OPS ? ?Please advise at [phone]. ?

## 2021-11-07 NOTE — Progress Notes (Signed)
? ?Subjective:  ? ? Patient ID: Kelli Moss, female    DOB: [birth year], 71 y.o.   MRN: [id number] ? ?HPI  ?Patient is here today for complete physical exam.  Her CT scan last year shows stability in her pulmonary nodule.  No further follow-up was recommended for this.  Coronary artery calcifications were seen.  Cardiology has performed the coronary arteries calcium score which was over 60th percentile.  Patient denies any angina.  She is not on a statin.  However her LDL cholesterol is well below 100.  Her hemoglobin A1c is elevated at 6.3 showing prediabetes.  We discussed options she has to treat this today.  I recommended Jardiance due to the reduction in death from cardiovascular disease.  We also discussed Ozempic which can facilitate weight loss.  Patient is interested in [place] and would like to check on the price prior to starting it.  The remainder of her lab work is listed below.  Her blood pressure today is extremely high and I verified this myself.  She refuses a bone density test.  She refuses a colonoscopy. ?Immunization History  ?Administered Date(s) Administered  ? Fluad Quad(high Dose 65+) 06/11/2019, 07/11/2020  ? Influenza, High Dose Seasonal PF 06/12/2018  ? Influenza,inj,Quad PF,6+ Mos 05/10/2014  ? PFIZER(Purple Top)SARS-COV-2 Vaccination 09/15/2019, 10/06/2019, 06/14/2020  ? Pneumococcal Conjugate-13 06/12/2018  ? Pneumococcal Polysaccharide-23 12/31/2016  ? Tdap 03/31/2009  ? Zoster, Live 05/30/2011  ? ? ?Appointment on 11/03/2021  ?Component Date Value Ref Range Status  ? Cholesterol 11/03/2021 155  <200 mg/dL Final  ? HDL 11/03/2021 33 (L)  > OR = 50 mg/dL Final  ? Triglycerides 11/03/2021 316 (H)  <150 mg/dL Final  ? Comment: . ?If a non-fasting specimen was collected, consider ?repeat triglyceride testing on a fasting specimen ?if clinically indicated.  ?Garald Balding al. J. of Clin. Lipidol. 8563;1:497-026. ?. ?  ? LDL Cholesterol (Calc) 11/03/2021 84  mg/dL (calc) Final  ? Comment:  Reference range: <100 ?Marland Kitchen ?Desirable range <100 mg/dL for primary prevention;   ?<70 mg/dL for patients with CHD or diabetic patients  ?with > or = 2 CHD risk factors. ?. ?LDL-C is now calculated using the Martin-Hopkins  ?calculation, which is a validated novel method providing  ?better accuracy than the Friedewald equation in the  ?estimation of LDL-C.  ?Cresenciano Genre et al. Annamaria Helling. 3785;885(02): 2061-2068  ?(http://education.[facility].com/faq/FAQ164) ?  ? Total CHOL/HDL Ratio 11/03/2021 4.7  <5.0 (calc) Final  ? Non-HDL Cholesterol (Calc) 11/03/2021 122  <130 mg/dL (calc) Final  ? Comment: For patients with diabetes plus 1 major ASCVD risk  ?factor, treating to a non-HDL-C goal of <100 mg/dL  ?(LDL-C of <70 mg/dL) is considered a therapeutic  ?option. ?  ? Hgb A1c MFr Bld 11/03/2021 6.3 (H)  <5.7 % of total Hgb Final  ? Comment: For someone without known diabetes, a hemoglobin  ?A1c value between 5.7% and 6.4% is consistent with ?prediabetes and should be confirmed with a  ?follow-up test. ?. ?For someone with known diabetes, a value <7% ?indicates that their diabetes is well controlled. A1c ?targets should be individualized based on duration of ?diabetes, age, comorbid conditions, and other ?considerations. ?. ?This assay result is consistent with an increased risk ?of diabetes. ?. ?Currently, no consensus exists regarding use of ?hemoglobin A1c for diagnosis of diabetes for children. ?. ?  ? Mean Plasma Glucose 11/03/2021 134  mg/dL Final  ? eAG (mmol/L) 11/03/2021 7.4  mmol/L Final  ? WBC 11/03/2021 9.0  3.8 -  10.8 Thousand/uL Final  ? RBC 11/03/2021 4.89  3.80 - 5.10 Million/uL Final  ? Hemoglobin 11/03/2021 13.6  11.7 - 15.5 g/dL Final  ? HCT 11/03/2021 41.1  35.0 - 45.0 % Final  ? MCV 11/03/2021 84.0  80.0 - 100.0 fL Final  ? MCH 11/03/2021 27.8  27.0 - 33.0 pg Final  ? MCHC 11/03/2021 33.1  32.0 - 36.0 g/dL Final  ? RDW 11/03/2021 14.2  11.0 - 15.0 % Final  ? Platelets 11/03/2021 263  140 - 400 Thousand/uL  Final  ? MPV 11/03/2021 10.2  7.5 - 12.5 fL Final  ? Neutro Abs 11/03/2021 5,706  1,500 - 7,800 cells/uL Final  ? Lymphs Abs 11/03/2021 2,268  850 - 3,900 cells/uL Final  ? Absolute Monocytes 11/03/2021 792  200 - 950 cells/uL Final  ? Eosinophils Absolute 11/03/2021 180  15 - 500 cells/uL Final  ? Basophils Absolute 11/03/2021 54  0 - 200 cells/uL Final  ? Neutrophils Relative % 11/03/2021 63.4  % Final  ? Total Lymphocyte 11/03/2021 25.2  % Final  ? Monocytes Relative 11/03/2021 8.8  % Final  ? Eosinophils Relative 11/03/2021 2.0  % Final  ? Basophils Relative 11/03/2021 0.6  % Final  ? Glucose, Bld 11/03/2021 135 (H)  65 - 99 mg/dL Final  ? Comment: . ?           Fasting reference interval ?. ?For someone without known diabetes, a glucose ?value >125 mg/dL indicates that they may have ?diabetes and this should be confirmed with a ?follow-up test. ?. ?  ? BUN 11/03/2021 13  7 - 25 mg/dL Final  ? Creat 11/03/2021 0.56 (L)  0.60 - 1.00 mg/dL Final  ? BUN/Creatinine Ratio 11/03/2021 23 (H)  6 - 22 (calc) Final  ? Sodium 11/03/2021 140  135 - 146 mmol/L Final  ? Potassium 11/03/2021 3.6  3.5 - 5.3 mmol/L Final  ? Chloride 11/03/2021 104  98 - 110 mmol/L Final  ? CO2 11/03/2021 25  20 - 32 mmol/L Final  ? Calcium 11/03/2021 9.1  8.6 - 10.4 mg/dL Final  ? Total Protein 11/03/2021 7.0  6.1 - 8.1 g/dL Final  ? Albumin 11/03/2021 4.2  3.6 - 5.1 g/dL Final  ? Globulin 11/03/2021 2.8  1.9 - 3.7 g/dL (calc) Final  ? AG Ratio 11/03/2021 1.5  1.0 - 2.5 (calc) Final  ? Total Bilirubin 11/03/2021 0.7  0.2 - 1.2 mg/dL Final  ? Alkaline phosphatase (APISO) 11/03/2021 70  37 - 153 U/L Final  ? AST 11/03/2021 17  10 - 35 U/L Final  ? ALT 11/03/2021 18  6 - 29 U/L Final  ? ? ? ?Past Medical History:  ?Diagnosis Date  ? Colon polyp   ? CTS (carpal tunnel syndrome)   ? Depression   ? Diabetes mellitus without complication ([place])   ? prediabetes  ? Dyslipidemia   ? Gastritis   ? Hypertension   ? Neuromuscular disorder ([place])   ? peripheral  neuropathy  ? ?No past surgical history on file. ?Current Outpatient Medications on File Prior to Visit  ?Medication Sig Dispense Refill  ? amLODipine (NORVASC) 10 MG tablet Take 1 tablet (10 mg total) by mouth daily. 90 tablet 3  ? ammonium lactate (AMLACTIN) 12 % cream Apply topically as needed for dry skin.    ? aspirin 81 MG tablet Take 81 mg by mouth daily.    ? cephALEXin (KEFLEX) 500 MG capsule Take 1 capsule (500 mg total) by mouth every  6 (six) hours. 20 capsule 0  ? CINNAMON PO Take 2,000 mg by mouth daily.    ? cyanocobalamin (,VITAMIN B-12,) 1000 MCG/ML injection INJECT 1 ML IN THE MUSCLE ONCE MONTHLY 30 mL 3  ? diazepam (VALIUM) 10 MG tablet TAKE 1 TABLET BY MOUTH EVERY 8 HOURS AS NEEDED FOR MUSCLE SPASM 60 tablet 0  ? Efinaconazole 10 % SOLN As directed for toenail fungus 8 mL 3  ? Flaxseed, Linseed, (FLAX SEEDS PO) Take 1 tablet by mouth daily.    ? fluticasone (FLONASE) 50 MCG/ACT nasal spray Place into both nostrils daily.    ? Grape Seed 100 MG CAPS Take by mouth.    ? hydrochlorothiazide (MICROZIDE) 12.5 MG capsule Take 1 capsule (12.5 mg total) by mouth daily. 90 capsule 3  ? LORazepam (ATIVAN) 1 MG tablet Take 1 tablet (1 mg total) by mouth at bedtime as needed. 90 tablet 0  ? losartan (COZAAR) 100 MG tablet Take 1 tablet (100 mg total) by mouth daily. 90 tablet 3  ? Magnesium 500 MG TABS Take 500 mg by mouth.    ? meloxicam (MOBIC) 7.5 MG tablet Take 1 tablet (7.5 mg total) by mouth daily. 30 tablet 0  ? metoprolol succinate (TOPROL-XL) 50 MG 24 hr tablet Take 1 tablet (50 mg total) by mouth daily. Take with or immediately following a meal. 90 tablet 3  ? Misc Natural Products (BLACK CHERRY CONCENTRATE PO) Take by mouth.    ? omega-3 acid ethyl esters (LOVAZA) 1 g capsule Take 1 capsule (1 g total) by mouth 2 (two) times daily. 180 capsule 3  ? phenazopyridine (PYRIDIUM) 100 MG tablet Take 1 tablet (100 mg total) by mouth 2 (two) times daily as needed for pain (urinray pain). 20 tablet 0  ?  Polyethyl Glycol-Propyl Glycol (SYSTANE OP) Apply to eye.    ? tiZANidine (ZANAFLEX) 4 MG tablet TAKE ONE TABLET BY MOUTH EVERY 8 HOURS AS NEEDED FOR  MUSCLE  SPASMS 90 tablet 2  ? TURMERIC PO Take 1,000

## 2021-11-07 NOTE — Telephone Encounter (Signed)
Can complete PA for Ozempic once rx has been sent in and need for PA has been determined.  ?

## 2021-11-10 NOTE — Telephone Encounter (Signed)
Spoke with patient and advised of Omega 3 recommendations. Patient states that she currently has a prescription for this from Dr. Claiborne Billings. Advised patient she would need to contact this provider if she is wanting refills. Patient voiced understanding. ? ?Patient inquired about Ozempic. Advised her we would complete PA process if necessary. ? ?Called [facility] to find out if PA needed for Ozempic. They state patient had prescription transferred to [facility] in [place]. ?Called [facility] in [place], they advise they have rx ready but ran through [facility] as patient has not provided them with any insurance information. ? ?Attempted to reach patient to advise, no answer and no vm available.  ?

## 2021-11-10 NOTE — Telephone Encounter (Signed)
Please advise on Omega 3 rx or does pt need to purchase OTC. Thanks! ?

## 2021-11-14 ENCOUNTER — Telehealth: Payer: Self-pay | Admitting: Cardiovascular Disease

## 2021-11-14 NOTE — Telephone Encounter (Signed)
?*  STAT* If patient is at the pharmacy, call can be transferred to refill team. ? ? ?1. Which medications need to be refilled? (please list name of each medication and dose if known)  ?omega-3 acid ethyl esters (LOVAZA) 1 g capsule ? ?2. Which pharmacy/location (including street and city if local pharmacy) is medication to be sent to? ?[facility], [place], [place] [address] ? ? ?3. Do they need a 30 day or 90 day supply?  ?90 day supply ? ?

## 2021-11-15 MED ORDER — OMEGA-3-ACID ETHYL ESTERS 1 G PO CAPS: 1.0000 | ORAL_CAPSULE | Freq: Two times a day (BID) | ORAL | 0 refills | Status: DC

## 2021-11-15 NOTE — Telephone Encounter (Signed)
Refill sent to [facility] ?

## 2021-11-20 DIAGNOSIS — Z20822 Contact with and (suspected) exposure to covid-19: Secondary | ICD-10-CM | POA: Diagnosis not present

## 2021-11-30 ENCOUNTER — Telehealth: Payer: Self-pay

## 2021-11-30 DIAGNOSIS — E119 Type 2 diabetes mellitus without complications: Secondary | ICD-10-CM

## 2021-11-30 MED ORDER — SEMAGLUTIDE (1 MG/DOSE) 4 MG/3ML ~~LOC~~ SOPN: 1.0000 mg | PEN_INJECTOR | SUBCUTANEOUS | 0 refills | Status: DC

## 2021-11-30 NOTE — Telephone Encounter (Signed)
Spoke with pt done w/first dose of Ozempic, per pt tol well. Rx sent to pharmacy per pt request ?

## 2021-12-19 ENCOUNTER — Ambulatory Visit (INDEPENDENT_AMBULATORY_CARE_PROVIDER_SITE_OTHER): Payer: Medicare Other | Admitting: Cardiovascular Disease

## 2021-12-19 ENCOUNTER — Encounter: Payer: Self-pay | Admitting: Cardiovascular Disease

## 2021-12-19 ENCOUNTER — Other Ambulatory Visit: Payer: Self-pay | Admitting: Cardiovascular Disease

## 2021-12-19 ENCOUNTER — Ambulatory Visit: Payer: Medicare Other | Admitting: Cardiovascular Disease

## 2021-12-19 VITALS — BP 138/72 | HR 79 | Ht 67.0 in | Wt 278.0 lb

## 2021-12-19 DIAGNOSIS — R600 Localized edema: Secondary | ICD-10-CM

## 2021-12-19 DIAGNOSIS — E782 Mixed hyperlipidemia: Secondary | ICD-10-CM | POA: Diagnosis not present

## 2021-12-19 DIAGNOSIS — R7303 Prediabetes: Secondary | ICD-10-CM

## 2021-12-19 DIAGNOSIS — R002 Palpitations: Secondary | ICD-10-CM

## 2021-12-19 DIAGNOSIS — I5189 Other ill-defined heart diseases: Secondary | ICD-10-CM

## 2021-12-19 DIAGNOSIS — G4733 Obstructive sleep apnea (adult) (pediatric): Secondary | ICD-10-CM | POA: Diagnosis not present

## 2021-12-19 DIAGNOSIS — I2510 Atherosclerotic heart disease of native coronary artery without angina pectoris: Secondary | ICD-10-CM | POA: Diagnosis not present

## 2021-12-19 DIAGNOSIS — I10 Essential (primary) hypertension: Secondary | ICD-10-CM | POA: Diagnosis not present

## 2021-12-19 DIAGNOSIS — I2584 Coronary atherosclerosis due to calcified coronary lesion: Secondary | ICD-10-CM | POA: Diagnosis not present

## 2021-12-19 MED ORDER — EZETIMIBE 10 MG PO TABS: 10.0000 mg | ORAL_TABLET | Freq: Every day | ORAL | 3 refills | Status: AC

## 2021-12-19 MED ORDER — OMEGA-3-ACID ETHYL ESTERS 1 G PO CAPS: 2.0000 g | ORAL_CAPSULE | Freq: Two times a day (BID) | ORAL | 0 refills | Status: DC

## 2021-12-19 MED ORDER — METOPROLOL SUCCINATE ER 50 MG PO TB24: 75.0000 mg | ORAL_TABLET | Freq: Every day | ORAL | 3 refills | Status: DC

## 2021-12-19 NOTE — Patient Instructions (Addendum)
Medication Instructions:  ?Increase Metoprolol Succinate to 75 mg every morning. (1.5 tablets) ?Increase HCTZ try to start taking it 2 times a week.  ?Increase Lovaza to 2 capsules twice a day. ? ?Start Zetia 10 mg daily  ? ? ?*If you need a refill on your cardiac medications before your next appointment, please call your pharmacy* ? ?Follow-Up: ?At [facility], you and your health needs are our priority.  As part of our continuing mission to provide you with exceptional heart care, we have created designated Provider Care Teams.  These Care Teams include your primary Cardiologist (physician) and Advanced Practice Providers (APPs -  Physician Assistants and Nurse Practitioners) who all work together to provide you with the care you need, when you need it. ? ?We recommend signing up for the patient portal called "MyChart".  Sign up information is provided on this After Visit Summary.  MyChart is used to connect with patients for Virtual Visits (Telemedicine).  Patients are able to view lab/test results, encounter notes, upcoming appointments, etc.  Non-urgent messages can be sent to your provider as well.   ?To learn more about what you can do with MyChart, go to [email or web address].   ? ?Your next appointment:   ?6 month(s) ? ?The format for your next appointment:   ?In Person ? ?Provider:   ?Shelva Majestic, MD   ? ? ? ? ? ? ? ? ? ?

## 2021-12-19 NOTE — Progress Notes (Signed)
? ?Cardiology Office Note   ? ?Date:  12/19/2021  ? ?ID:  Kelli Moss, DOB Jan 11, 1951, MRN [id number] ? ?PCP:  [name], MD  ?Cardiologist:  Shelva Majestic, MD  ? ? ?One year F/U cardiology evaluation initially referred through the courtesy of Dr. Jenna Luo for evaluation of chest pain. Patient has also requested establishment of sleep medicine care with me. ? ?History of Present Illness:  ?Kelli Moss is a 71 y.o. female who was initially referred to me through the courtesy of Dr. Dennard Schaumann for evaluation of grabbing chest discomfort.  She is the sister of my patient Kelli Moss.  I saw her for my initial evaluation in December 2019 and last saw her on December 05, 2020. ? ?[name] has a longstanding history of hypertension for > 20 years and is also felt to have metabolic syndrome.  She has a history of hiatal hernia and celiac disease followed by Dr. Collene Mares as well as a history of pernicious anemia and peripheral neuropathy.  She also has a longstanding history of obstructive sleep apnea and has been on CPAP therapy for over 20 years.  She had been evaluated by Dr. Dennard Schaumann and during her evaluation she admitted to experiencing episodes of a grabbing-like chest discomfort which is nonexertional but may radiate to her jaws.  She admits to having some anxiety.  She also has had issues with lower extremity edema.  She is extremely sedentary.  She has additional risk factors including a brother with a history of CAD.  She has a remote history of tobacco.  When I initially saw her on July 31, 2018 she had significant tense lower extremity edema to her thighs and was on amlodipine 10 mg and losartan 100 mg for hypertension.  I recommended she decrease amlodipine to 5 mg and with a resting pulse of 95 added beta-blocker therapy with Toprol-XL 25 mg for 2 weeks with titration up to 50 mg.  I also added HCTZ 12.5 mg to help with her edema.  I recommended she undergo a 2D echo Doppler study as well  as a coronary CTA to assess for subclinical atherosclerosis.  She had a significant history of mixed hyperlipidemia and laboratory in October 2019 showed triglycerides of 350, and VLDL of 31.  I recommended the addition of Vascepa 2 capsules twice a day I discussed the importance of weight loss.  She was continuing to use CPAP and is followed by [facility] neurology. ? ?She was evaluated by Almyra Deforest, [name].  Her coronary calcium score was 23 which placed her in the 62nd percentile for age and sex matched control.  She was found to have a 6 mm right middle lobe nodule and on repeat evaluation in July 2020 this was unchanged.  There was diffuse dilation of her esophagus suggestive of esophagitis and dysmotility disorder.  She tells me that she is in need to have another CT since another lung nodule was found. ? ?I last saw her on December 05, 2020 at which time she was not having any anginal symptomatology but admitted to occasional "chest cramping " which seem to be precipitated when she would become upset.  She has a history of morbid obesity and at that time was wondering about initiating a keto diet.  I discussed that this was not optimal for heart health and recommended the Mediterranean diet.   ?During her evaluation, she admitted to 100% compliance with CPAP use. ? ?Since I last saw  her, she recently has had issues with blood pressure elevation.  Currently she has been taking amlodipine 10 mg daily, hydrochlorothiazide 12.5 mg but she states she rarely takes this and has only been taking at most 5 times per month.  She has been on losartan 100 mg daily and metoprolol 50 mg daily.  When she recently saw Dr. Dennard Schaumann on March 31 for blood pressure was elevated in the 945 systolic range.  She also admits to periods where her heart rate increases.  She wishes that I assume her sleep medicine care.  I was able to obtain a sleep study which was interpreted by Dr. Wallie Char at [facility] neurology from December 2017.   This revealed moderate to severe obstructive sleep apnea with an overall AHI of 17 events per hour, RDI 18.3/h NREM AHI was severe at 40/h versus non-REM AHI of 16.7/h.  She brought her machine to the office today.  We were able to obtain a download through her card since her machine set up date was September 15, 2016 with [name] now as her DME company.  A download from November 20, 2021 through Dec 19, 2021 shows 100% usage and average use of 8 hours and 18 minutes.  Her ResMed AirSense 10 AutoSet unit is set at a pressure of 15 cm and AHI 0.5.  She has been using a full facemask, AirTouch F20.  I reviewed her most recent evaluation with Dr. Dennard Schaumann.  She is not on any therapy for lipid-lowering treatment and apparently although there was suggestion to initiate potential statin this was never done and there is concern of the patient concerning myalgias.  Laboratory from his evaluation in March 2023 shows the LDL of 84 and triglycerides were significantly elevated at 316 despite taking Lovaza 1 capsule twice daily.  Total cholesterol was 155 and HDL DL was low at 33.  Hemoglobin A1c was 6.3 consistent with prediabetes.  There was some discussion of initiating Ozempic to assist with appetite suppression but this has not been done.  She presents for yearly cardiology evaluation and to establish sleep medicine care with me. ? ?Past Medical History:  ?Diagnosis Date  ? Colon polyp   ? CTS (carpal tunnel syndrome)   ? Depression   ? Diabetes mellitus without complication ([place])   ? prediabetes  ? Dyslipidemia   ? Gastritis   ? Hypertension   ? Neuromuscular disorder ([place])   ? peripheral neuropathy  ? ? ?No past surgical history on file. ? ?Current Medications: ?Outpatient Medications Prior to Visit  ?Medication Sig Dispense Refill  ? ammonium lactate (AMLACTIN) 12 % cream Apply topically as needed for dry skin.    ? aspirin 81 MG tablet Take 81 mg by mouth daily.    ? cephALEXin (KEFLEX) 500 MG capsule Take 1 capsule (500 mg  total) by mouth every 6 (six) hours. 20 capsule 0  ? CINNAMON PO Take 2,000 mg by mouth daily.    ? cyanocobalamin (,VITAMIN B-12,) 1000 MCG/ML injection INJECT 1 ML IN THE MUSCLE ONCE MONTHLY 30 mL 3  ? diazepam (VALIUM) 10 MG tablet TAKE 1 TABLET BY MOUTH EVERY 8 HOURS AS NEEDED FOR MUSCLE SPASM 60 tablet 0  ? Efinaconazole 10 % SOLN As directed for toenail fungus 8 mL 3  ? Flaxseed, Linseed, (FLAX SEEDS PO) Take 1 tablet by mouth daily.    ? fluticasone (FLONASE) 50 MCG/ACT nasal spray Place into both nostrils daily.    ? Grape Seed 100 MG CAPS Take  by mouth.    ? hydrochlorothiazide (MICROZIDE) 12.5 MG capsule Take 1 capsule (12.5 mg total) by mouth daily. 90 capsule 3  ? LORazepam (ATIVAN) 1 MG tablet Take 1 tablet (1 mg total) by mouth at bedtime as needed. 90 tablet 0  ? Magnesium 500 MG TABS Take 500 mg by mouth.    ? meloxicam (MOBIC) 7.5 MG tablet Take 1 tablet (7.5 mg total) by mouth daily. 30 tablet 0  ? Misc Natural Products (BLACK CHERRY CONCENTRATE PO) Take by mouth.    ? phenazopyridine (PYRIDIUM) 100 MG tablet Take 1 tablet (100 mg total) by mouth 2 (two) times daily as needed for pain (urinray pain). 20 tablet 0  ? Polyethyl Glycol-Propyl Glycol (SYSTANE OP) Apply to eye.    ? Semaglutide, 1 MG/DOSE, 4 MG/3ML SOPN Inject 1 mg as directed once a week. 3 mL 0  ? tiZANidine (ZANAFLEX) 4 MG tablet TAKE ONE TABLET BY MOUTH EVERY 8 HOURS AS NEEDED FOR  MUSCLE  SPASMS 90 tablet 2  ? TURMERIC PO Take 1,000 mg by mouth.    ? UNABLE TO FIND Med Name: TENS UNIT DX: M54.2; M54.9 1 Units 0  ? Vitamin D, Ergocalciferol, (DRISDOL) 1.25 MG (50000 UNIT) CAPS capsule Take 1 capsule (50,000 Units total) by mouth every 7 (seven) days. 12 capsule 0  ? amLODipine (NORVASC) 10 MG tablet Take 1 tablet (10 mg total) by mouth daily. 90 tablet 3  ? losartan (COZAAR) 100 MG tablet Take 1 tablet (100 mg total) by mouth daily. 90 tablet 3  ? metoprolol succinate (TOPROL-XL) 50 MG 24 hr tablet Take 1 tablet (50 mg total) by  mouth daily. Take with or immediately following a meal. 90 tablet 3  ? omega-3 acid ethyl esters (LOVAZA) 1 g capsule Take 1 capsule (1 g total) by mouth 2 (two) times daily. 180 capsule 0  ? ?No facility-

## 2021-12-21 ENCOUNTER — Telehealth: Payer: Self-pay | Admitting: *Deleted

## 2021-12-21 NOTE — Telephone Encounter (Signed)
Order for replacement CPAP machine faxed to [facility]. ?

## 2021-12-25 ENCOUNTER — Other Ambulatory Visit: Payer: Self-pay | Admitting: Family Medicine

## 2021-12-25 DIAGNOSIS — E119 Type 2 diabetes mellitus without complications: Secondary | ICD-10-CM

## 2021-12-26 NOTE — Telephone Encounter (Signed)
Requested medications are due for refill today.  yes ? ?Requested medications are on the active medications list.  yes ? ?Last refill. 11/30/2021 86m 0 refills ? ?Future visit scheduled.   no ? ?Notes to clinic.  Refilled failed protocol d/t abnormal labs. ? ? ? ?Requested Prescriptions  ?Pending Prescriptions Disp Refills  ? OZEMPIC, 1 MG/DOSE, 4 MG/3ML SOPN [Pharmacy Med Name: OZEMPIC 1 MG DOSE PEN[R]]  0  ?  Sig: INJECT '1MG'$  UNDER THE SKIN EVERY WEEK  ?  ? Endocrinology:  Diabetes - GLP-1 Receptor Agonists - semaglutide Failed - 12/25/2021 12:55 PM  ?  ?  Failed - HBA1C in normal range and within 180 days  ?  Hgb A1c MFr Bld  ?Date Value Ref Range Status  ?11/03/2021 6.3 (H) <5.7 % of total Hgb Final  ?  Comment:  ?  For someone without known diabetes, a hemoglobin  ?A1c value between 5.7% and 6.4% is consistent with ?prediabetes and should be confirmed with a  ?follow-up test. ?. ?For someone with known diabetes, a value <7% ?indicates that their diabetes is well controlled. A1c ?targets should be individualized based on duration of ?diabetes, age, comorbid conditions, and other ?considerations. ?. ?This assay result is consistent with an increased risk ?of diabetes. ?. ?Currently, no consensus exists regarding use of ?hemoglobin A1c for diagnosis of diabetes for children. ?. ?  ?  ?  ?  ?  Failed - Cr in normal range and within 360 days  ?  Creat  ?Date Value Ref Range Status  ?11/03/2021 0.56 (L) 0.60 - 1.00 mg/dL Final  ? ?Creatinine, Urine  ?Date Value Ref Range Status  ?10/27/2020 145 20 - 275 mg/dL Final  ?  ?  ?  ?  Passed - Valid encounter within last 6 months  ?  Recent Outpatient Visits   ? ?      ? 1 month [facility]  ? 4 months ago Dysuria  ? [facility] [place]  ? 1 year ago [facility] medical exam  [facility]  ? 2 years [facility]  [facility]  ? 2 years ago Trigger thumb, right thumb  ? [facility] [place], WCammie Mcgee MD  ? ?  ?  ? ? ?  ?  ?  ?  ?

## 2021-12-26 NOTE — Telephone Encounter (Signed)
Need to know if patient would like to increase to next dose of Ozempic, also need to know patient's current weight. ? ?[place] ? ? ?

## 2021-12-27 NOTE — Telephone Encounter (Signed)
Pt is requesting refill on her Ozempic.  ? ?Pt last OV 11/07/21 wt was : 258lb  ? ?currently 278lb per OV w/cardiologist 12/19/21 ? ?Please advice?  ?

## 2022-01-10 DIAGNOSIS — H1045 Other chronic allergic conjunctivitis: Secondary | ICD-10-CM | POA: Diagnosis not present

## 2022-01-10 DIAGNOSIS — H35371 Puckering of macula, right eye: Secondary | ICD-10-CM | POA: Diagnosis not present

## 2022-01-10 DIAGNOSIS — H04123 Dry eye syndrome of bilateral lacrimal glands: Secondary | ICD-10-CM | POA: Diagnosis not present

## 2022-01-19 DIAGNOSIS — H1045 Other chronic allergic conjunctivitis: Secondary | ICD-10-CM | POA: Diagnosis not present

## 2022-01-19 DIAGNOSIS — H25813 Combined forms of age-related cataract, bilateral: Secondary | ICD-10-CM | POA: Diagnosis not present

## 2022-01-19 DIAGNOSIS — H10403 Unspecified chronic conjunctivitis, bilateral: Secondary | ICD-10-CM | POA: Diagnosis not present

## 2022-01-19 DIAGNOSIS — H04123 Dry eye syndrome of bilateral lacrimal glands: Secondary | ICD-10-CM | POA: Diagnosis not present

## 2022-01-19 DIAGNOSIS — H35371 Puckering of macula, right eye: Secondary | ICD-10-CM | POA: Diagnosis not present

## 2022-02-05 ENCOUNTER — Encounter: Payer: Self-pay | Admitting: Podiatry

## 2022-02-05 ENCOUNTER — Ambulatory Visit (INDEPENDENT_AMBULATORY_CARE_PROVIDER_SITE_OTHER): Payer: Medicare Other | Admitting: Podiatry

## 2022-02-05 DIAGNOSIS — L608 Other nail disorders: Secondary | ICD-10-CM | POA: Diagnosis not present

## 2022-02-05 DIAGNOSIS — M79675 Pain in left toe(s): Secondary | ICD-10-CM

## 2022-02-05 DIAGNOSIS — B351 Tinea unguium: Secondary | ICD-10-CM | POA: Diagnosis not present

## 2022-02-05 DIAGNOSIS — E119 Type 2 diabetes mellitus without complications: Secondary | ICD-10-CM | POA: Diagnosis not present

## 2022-02-05 DIAGNOSIS — M79674 Pain in right toe(s): Secondary | ICD-10-CM

## 2022-02-05 NOTE — Progress Notes (Signed)
This patient presents  to my office for at risk foot care.  This patient requires this care by a professional since this patient will be at risk due to having diabetic neuropathy.  Her neuropathy is due to pernicious anemia. She has also been diagnosed with neuromuscular disorder.  This patient is unable to cut nails herself since the patient cannot reach her nails.These nails are painful walking and wearing shoes.  This patient presents for at risk foot care today.  General Appearance  Alert, conversant and in no acute stress.  Vascular  Dorsalis pedis and posterior tibial  pulses are weakly  palpable due to swelling bilaterally.  Capillary return is within normal limits  bilaterally. Temperature is within normal limits  bilaterally.  Neurologic  Senn-Weinstein monofilament wire test absent. bilaterally. Muscle power absent. bilaterally.  Nails Thick disfigured discolored nails with subungual debris  from hallux to fifth toes bilaterally. No evidence of bacterial infection or drainage bilaterally.  Pincer nails noted.  Orthopedic  No limitations of motion  feet .  No crepitus or effusions noted.  No bony pathology or digital deformities noted.  Skin  normotropic skin with no porokeratosis noted bilaterally.  No signs of infections or ulcers noted.     Onychomycosis  Pain in right toes  Pain in left toes  Consent was obtained for treatment procedures.   Mechanical debridement of nails 1-5  bilaterally performed with a nail nipper.  Filed with dremel without incident.    Return office visit   3 months                   Told patient to return for periodic foot care and evaluation due to potential at risk complications.   Che Rachal DPM   

## 2022-02-06 ENCOUNTER — Ambulatory Visit: Payer: Medicare Other | Admitting: Podiatry

## 2022-02-08 ENCOUNTER — Ambulatory Visit: Payer: Medicare Other | Admitting: Podiatry

## 2022-02-18 ENCOUNTER — Other Ambulatory Visit: Payer: Self-pay | Admitting: Family Medicine

## 2022-02-18 DIAGNOSIS — E119 Type 2 diabetes mellitus without complications: Secondary | ICD-10-CM

## 2022-02-19 NOTE — Telephone Encounter (Signed)
Requested Prescriptions  Pending Prescriptions Disp Refills  . OZEMPIC, 1 MG/DOSE, 4 MG/3ML SOPN [Pharmacy Med Name: OZEMPIC 1 MG DOSE PEN[R]] 3 mL 1    Sig: INJECT '1MG'$  UNDER THE SKIN EVERY WEEK     Endocrinology:  Diabetes - GLP-1 Receptor Agonists - semaglutide Failed - 02/18/2022  9:45 AM      Failed - HBA1C in normal range and within 180 days    Hgb A1c MFr Bld  Date Value Ref Range Status  11/03/2021 6.3 (H) <5.7 % of total Hgb Final    Comment:    For someone without known diabetes, a hemoglobin  A1c value between 5.7% and 6.4% is consistent with prediabetes and should be confirmed with a  follow-up test. . For someone with known diabetes, a value <7% indicates that their diabetes is well controlled. A1c targets should be individualized based on duration of diabetes, age, comorbid conditions, and other considerations. . This assay result is consistent with an increased risk of diabetes. . Currently, no consensus exists regarding use of hemoglobin A1c for diagnosis of diabetes for children. .          Failed - Cr in normal range and within 360 days    Creat  Date Value Ref Range Status  11/03/2021 0.56 (L) 0.60 - 1.00 mg/dL Final   Creatinine, Urine  Date Value Ref Range Status  10/27/2020 145 20 - 275 mg/dL Final         Passed - Valid encounter within last 6 months    Recent Outpatient Visits          3 months ago General medical exam   [name], MD   5 months ago [place] [facility] Eulogio Bear, NP   1 year ago General medical exam   [facility] [name], MD   2 years ago General medical exam   [facility], Warren T, MD   2 years ago Trigger thumb, right thumb   [place] Pickard, Cammie Mcgee, MD      Future Appointments            In 4 months Troy Sine, MD [facility], [facility]

## 2022-03-06 ENCOUNTER — Telehealth: Payer: Self-pay | Admitting: Cardiovascular Disease

## 2022-03-06 NOTE — Telephone Encounter (Signed)
[name] from [facility] called and wanted to make Dr. Claiborne Billings aware that the patient is not eligible for a new CPAP machine until October 2023. The order that [name] received had to be cancelled.  The office will need to send a new order and office visit notes after the patient's office visit in November

## 2022-03-26 ENCOUNTER — Ambulatory Visit (INDEPENDENT_AMBULATORY_CARE_PROVIDER_SITE_OTHER): Payer: Medicare Other | Admitting: Family Medicine

## 2022-03-26 VITALS — BP 132/68 | HR 70 | Ht 67.0 in | Wt 238.8 lb

## 2022-03-26 DIAGNOSIS — E8881 Metabolic syndrome: Secondary | ICD-10-CM | POA: Diagnosis not present

## 2022-03-26 DIAGNOSIS — I2584 Coronary atherosclerosis due to calcified coronary lesion: Secondary | ICD-10-CM | POA: Diagnosis not present

## 2022-03-26 DIAGNOSIS — I2510 Atherosclerotic heart disease of native coronary artery without angina pectoris: Secondary | ICD-10-CM | POA: Diagnosis not present

## 2022-03-26 DIAGNOSIS — E119 Type 2 diabetes mellitus without complications: Secondary | ICD-10-CM

## 2022-03-26 MED ORDER — SEMAGLUTIDE (2 MG/DOSE) 8 MG/3ML ~~LOC~~ SOPN: 2.0000 mg | PEN_INJECTOR | SUBCUTANEOUS | 11 refills | Status: DC

## 2022-03-26 MED ORDER — CYANOCOBALAMIN 1000 MCG/ML IJ SOLN: INTRAMUSCULAR | 3 refills | Status: DC

## 2022-03-26 NOTE — Progress Notes (Signed)
Subjective:    Patient ID: Kelli Moss, female    DOB: 1951-08-15, 71 y.o.   MRN: [id number]  HPI  HgA1c in March was 6.3.  Started on ozempic to help manage sugars and hopefully achieve weight loss.  I am extremely proud of this patient.  She is lost 23 pounds according to her scale.  She is tolerating the Ozempic without issue.  She denies any nausea or vomiting or abdominal pain.  She does still deal with severe neuropathy in the.  In the past she tried oral replacement with vitamin B12 but she does have a history of B12 deficiency.  However she felt like the parenteral B12 was working better to help control her pain and she would like to resume parenteral B12 injections on a monthly basis which I believe would be fine.  She also still deals with chronic neck pain due to cervical stenosis and degenerative disc disease.   Past Medical History:  Diagnosis Date   Colon polyp    CTS (carpal tunnel syndrome)    Depression    Diabetes mellitus without complication ([place])    prediabetes   Dyslipidemia    Gastritis    Hypertension    Neuromuscular disorder ([place])    peripheral neuropathy   No past surgical history on file. Current Outpatient Medications on File Prior to Visit  Medication Sig Dispense Refill   amLODipine (NORVASC) 10 MG tablet Take 1 tablet by mouth once daily 90 tablet 3   ammonium lactate (AMLACTIN) 12 % cream Apply topically as needed for dry skin.     aspirin 81 MG tablet Take 81 mg by mouth daily.     cephALEXin (KEFLEX) 500 MG capsule Take 1 capsule (500 mg total) by mouth every 6 (six) hours. 20 capsule 0   CINNAMON PO Take 2,000 mg by mouth daily.     cyanocobalamin (,VITAMIN B-12,) 1000 MCG/ML injection INJECT 1 ML IN THE MUSCLE ONCE MONTHLY 30 mL 3   diazepam (VALIUM) 10 MG tablet TAKE 1 TABLET BY MOUTH EVERY 8 HOURS AS NEEDED FOR MUSCLE SPASM 60 tablet 0   Efinaconazole 10 % SOLN As directed for toenail fungus 8 mL 3   ezetimibe (ZETIA) 10 MG tablet Take 1  tablet (10 mg total) by mouth daily. 90 tablet 3   Flaxseed, Linseed, (FLAX SEEDS PO) Take 1 tablet by mouth daily.     fluticasone (FLONASE) 50 MCG/ACT nasal spray Place into both nostrils daily.     Grape Seed 100 MG CAPS Take by mouth.     hydrochlorothiazide (MICROZIDE) 12.5 MG capsule Take 1 capsule (12.5 mg total) by mouth daily. 90 capsule 3   LORazepam (ATIVAN) 1 MG tablet Take 1 tablet (1 mg total) by mouth at bedtime as needed. 90 tablet 0   losartan (COZAAR) 100 MG tablet Take 1 tablet by mouth once daily 90 tablet 3   Magnesium 500 MG TABS Take 500 mg by mouth.     meloxicam (MOBIC) 7.5 MG tablet Take 1 tablet (7.5 mg total) by mouth daily. 30 tablet 0   metoprolol succinate (TOPROL-XL) 50 MG 24 hr tablet TAKE 1 TABLET BY MOUTH ONCE DAILY WITH OR IMMEDIATELY FOLLOWING A MEAL 135 tablet 3   Misc Natural Products (BLACK CHERRY CONCENTRATE PO) Take by mouth.     omega-3 acid ethyl esters (LOVAZA) 1 g capsule Take 2 capsules (2 g total) by mouth 2 (two) times daily. 180 capsule 0   OZEMPIC, 1 MG/DOSE, 4  MG/3ML SOPN INJECT '1MG'$  UNDER THE SKIN EVERY WEEK 3 mL 1   phenazopyridine (PYRIDIUM) 100 MG tablet Take 1 tablet (100 mg total) by mouth 2 (two) times daily as needed for pain (urinray pain). 20 tablet 0   Polyethyl Glycol-Propyl Glycol (SYSTANE OP) Apply to eye.     tiZANidine (ZANAFLEX) 4 MG tablet TAKE ONE TABLET BY MOUTH EVERY 8 HOURS AS NEEDED FOR  MUSCLE  SPASMS 90 tablet 2   TURMERIC PO Take 1,000 mg by mouth.     UNABLE TO FIND Med Name: TENS UNIT DX: M54.2; M54.9 1 Units 0   Vitamin D, Ergocalciferol, (DRISDOL) 1.25 MG (50000 UNIT) CAPS capsule Take 1 capsule (50,000 Units total) by mouth every 7 (seven) days. 12 capsule 0   No current facility-administered medications on file prior to visit.   Allergies  Allergen Reactions   Lyrica [Pregabalin]     nightmares   Social History   Socioeconomic History   Marital status: Married    Spouse name: Not on file   Number of  children: Not on file   Years of education: Not on file   Highest education level: Not on file  Occupational History   Not on file  Tobacco Use   Smoking status: Former    Types: Cigarettes    Quit date: 01/23/1988    Years since quitting: 34.1   Smokeless tobacco: Never  Vaping Use   Vaping Use: Never used  Substance and Sexual Activity   Alcohol use: No   Drug use: No   Sexual activity: Not on file  Other Topics Concern   Not on file  Social History Narrative   Not on file   Social Determinants of Health   Financial Resource Strain: Low Risk  (04/01/2020)   Overall Financial Resource Strain (CARDIA)    Difficulty of Paying Living Expenses: Not very hard  Food Insecurity: Not on file  Transportation Needs: Not on file  Physical Activity: Not on file  Stress: Not on file  Social Connections: Not on file  Intimate Partner Violence: Not on file   No family history on file.    Review of Systems  All other systems reviewed and are negative.      Objective:   Physical Exam Vitals reviewed.  Constitutional:      General: She is not in acute distress.    Appearance: She is well-developed. She is not diaphoretic.  HENT:     Head: Normocephalic and atraumatic.  Neck:     Thyroid: No thyromegaly.     Vascular: No JVD.     Trachea: No tracheal deviation.  Cardiovascular:     Rate and Rhythm: Normal rate and regular rhythm.     Heart sounds: Normal heart sounds. No murmur heard.    No friction rub. No gallop.  Pulmonary:     Effort: Pulmonary effort is normal. No respiratory distress.     Breath sounds: Normal breath sounds. No stridor. No wheezing or rales.  Chest:     Chest wall: No tenderness.  Abdominal:     General: Bowel sounds are normal. There is no distension.     Palpations: Abdomen is soft. There is no mass.     Tenderness: There is no abdominal tenderness. There is no guarding or rebound.  Musculoskeletal:     Cervical back: Rigidity present.   Lymphadenopathy:     Cervical: No cervical adenopathy.  Neurological:     Mental Status: She is alert and  oriented to person, place, and time.     Cranial Nerves: No cranial nerve deficit.     Motor: No abnormal muscle tone.     Coordination: Coordination normal.     Deep Tendon Reflexes: Reflexes are normal and symmetric.    Wt Readings from Last 3 Encounters:  03/26/22 238 lb 12.8 oz (108.3 kg)  12/19/21 278 lb (126.1 kg)  11/07/21 258 lb 12.8 oz (117.4 kg)         Assessment & Plan:  Diabetes mellitus without complication (HCC) - Plan: COMPLETE METABOLIC PANEL WITH GFR, Hemoglobin A2N  Metabolic syndrome - Plan: COMPLETE METABOLIC PANEL WITH GFR, Hemoglobin A1c I am very proud of her weight loss.  We will increase Ozempic to 2 mg subcu weekly.  I encouraged her to keep up the effort.  We will resume parenteral B12 1000 mcg IM every month for her history of B12 deficiency and peripheral neuropathy.

## 2022-03-27 ENCOUNTER — Telehealth: Payer: Self-pay

## 2022-03-27 LAB — COMPLETE METABOLIC PANEL WITH GFR
AG Ratio: 1.4 (calc) (ref 1.0–2.5)
ALT: 13 U/L (ref 6–29)
AST: 13 U/L (ref 10–35)
Albumin: 4.2 g/dL (ref 3.6–5.1)
Alkaline phosphatase (APISO): 69 U/L (ref 37–153)
BUN: 11 mg/dL (ref 7–25)
CO2: 24 mmol/L (ref 20–32)
Calcium: 9.3 mg/dL (ref 8.6–10.4)
Chloride: 104 mmol/L (ref 98–110)
Creat: 0.71 mg/dL (ref 0.60–1.00)
Globulin: 2.9 g/dL (calc) (ref 1.9–3.7)
Glucose, Bld: 103 mg/dL — ABNORMAL HIGH (ref 65–99)
Potassium: 3.9 mmol/L (ref 3.5–5.3)
Sodium: 138 mmol/L (ref 135–146)
Total Bilirubin: 0.6 mg/dL (ref 0.2–1.2)
Total Protein: 7.1 g/dL (ref 6.1–8.1)
eGFR: 91 mL/min/{1.73_m2} (ref >=60)

## 2022-03-27 LAB — HEMOGLOBIN A1C
Hgb A1c MFr Bld: 5.1 % of total Hgb (ref <5.7)
Mean Plasma Glucose: 100 mg/dL
eAG (mmol/L): 5.5 mmol/L

## 2022-03-27 NOTE — Telephone Encounter (Signed)
Kelli Moss at [facility] re B12 Inj.  Wanted to verify the orders. Jonetta Speak per Dr. Samella Parr notes it said" will resume B12 1000 mcg IM every month"

## 2022-05-07 ENCOUNTER — Telehealth: Payer: Self-pay | Admitting: Family Medicine

## 2022-05-07 ENCOUNTER — Other Ambulatory Visit: Payer: Self-pay | Admitting: Family Medicine

## 2022-05-07 MED ORDER — MOLNUPIRAVIR EUA 200MG CAPSULE: 4.0000 | ORAL_CAPSULE | Freq: Two times a day (BID) | ORAL | 0 refills | Status: AC

## 2022-05-07 NOTE — Telephone Encounter (Signed)
Patient called to report positive COVID test taken 05/07/22. Requesting for provider to call in medication to pharmacy.  Pharmacy confirmed as:  [facility], [place] [address] AT [address]  [address] [place], [place] [address]  Phone:  [phone]  Fax:  [phone]  DEA #:  [id number]  Please advise at [phone], or [phone].

## 2022-05-10 ENCOUNTER — Ambulatory Visit: Payer: Medicare Other | Admitting: Podiatry

## 2022-05-14 ENCOUNTER — Other Ambulatory Visit: Payer: Self-pay | Admitting: Family Medicine

## 2022-05-22 NOTE — Telephone Encounter (Signed)
ok 

## 2022-06-14 ENCOUNTER — Telehealth: Payer: Self-pay | Admitting: Cardiovascular Disease

## 2022-06-14 MED ORDER — METOPROLOL SUCCINATE ER 50 MG PO TB24: ORAL_TABLET | ORAL | 2 refills | Status: DC

## 2022-06-14 NOTE — Telephone Encounter (Signed)
Pt c/o medication issue:  1. Name of Medication: metoprolol succinate (TOPROL-XL) 50 MG 24 hr tablet  2. How are you currently taking this medication (dosage and times per day)? TAKE 1 TABLET BY MOUTH ONCE DAILY WITH OR IMMEDIATELY FOLLOWING A MEAL  3. Are you having a reaction (difficulty breathing--STAT)? no  4. What is your medication issue? Calling to say that they have a prescription for 1.5 tablet but our system is show 1 tablet. Please advise which one should it be. And if it is 1.5 they need prescription that says for the full 90days supply.

## 2022-06-14 NOTE — Telephone Encounter (Signed)
Copied from 12/19/21 MD note:  She also has noticed that time her heart rate has increased and there is some occasional swelling.  As result, I have recommended further titration of her metoprolol succinate from 50 mg up to 75 mg.  Per chart review, Rx sent in on 12/19/21 for '50mg'$  tabs - take 1 PO QD  She picked up a quantity of 135 in July - instructions are different.   Rx sent in for correct quantity and instructions to [facility].

## 2022-06-18 ENCOUNTER — Ambulatory Visit (INDEPENDENT_AMBULATORY_CARE_PROVIDER_SITE_OTHER): Payer: Medicare Other | Admitting: Family Medicine

## 2022-06-18 ENCOUNTER — Encounter: Payer: Self-pay | Admitting: Family Medicine

## 2022-06-18 VITALS — BP 138/72 | HR 80 | Temp 97.8°F | Ht 67.0 in | Wt 232.0 lb

## 2022-06-18 DIAGNOSIS — H5713 Ocular pain, bilateral: Secondary | ICD-10-CM

## 2022-06-18 DIAGNOSIS — N3001 Acute cystitis with hematuria: Secondary | ICD-10-CM | POA: Diagnosis not present

## 2022-06-18 DIAGNOSIS — R300 Dysuria: Secondary | ICD-10-CM | POA: Diagnosis not present

## 2022-06-18 LAB — MICROSCOPIC MESSAGE

## 2022-06-18 LAB — URINALYSIS, ROUTINE W REFLEX MICROSCOPIC
Bilirubin Urine: NEGATIVE
Glucose, UA: NEGATIVE
Hyaline Cast: NONE SEEN /LPF
Ketones, ur: NEGATIVE
Nitrite: NEGATIVE
Specific Gravity, Urine: 1.015 (ref 1.001–1.035)
pH: 5.5 (ref 5.0–8.0)

## 2022-06-18 MED ORDER — CEPHALEXIN 500 MG PO CAPS: 500.0000 mg | ORAL_CAPSULE | Freq: Two times a day (BID) | ORAL | 0 refills | Status: AC

## 2022-06-18 NOTE — Progress Notes (Signed)
   Acute Office Visit  Subjective:     Patient ID: Kelli Moss, female    DOB: [birth year], 71 y.o.   MRN: [id number]  Chief Complaint  Patient presents with   Follow-up    Possible pink eye     HPI Patient is in today for bladder fullness and eye symptoms.   She reports urinary frequency and bladder fullness. Denis burning with urination, hematuria, fever, chills, night sweats, nausea, or vomiting.  6 weeks post covid, bilateral eye crusting in AM, "stabbing" eye pain, blurred vision, photosensitivity for 1.5 weeks. Has tried warm washcloths and refresh eye drops. She called Dr Kelli Moss, opthamology, today and he called in Tobramycin. She was instructed to see them in 5 days if symptoms not improved.   Review of Systems  Constitutional: Negative.   HENT: Negative.    Eyes:  Positive for blurred vision, photophobia, pain and discharge.  Gastrointestinal: Negative.   Genitourinary:  Positive for dysuria and frequency.  Neurological: Negative.   All other systems reviewed and are negative.       Objective:    BP 138/72   Pulse 80   Temp 97.8 F (36.6 C) (Oral)   Ht '5\' 7"'$  (1.702 m)   Wt 232 lb (105.2 kg)   SpO2 98%   BMI 36.34 kg/m    Physical Exam Vitals and nursing note reviewed.  Constitutional:      Appearance: Normal appearance. She is normal weight.  HENT:     Head: Normocephalic and atraumatic.  Eyes:     Extraocular Movements: Extraocular movements intact.     Conjunctiva/sclera: Conjunctivae normal.     Pupils: Pupils are equal, round, and reactive to light.     Comments: Mild edema and erythema bilateral medial lids  Skin:    General: Skin is warm and dry.  Neurological:     General: No focal deficit present.     Mental Status: She is alert and oriented to person, place, and time. Mental status is at baseline.  Psychiatric:        Mood and Affect: Mood normal.        Behavior: Behavior normal.        Thought Content: Thought content normal.         Judgment: Judgment normal.     No results found for any visits on 06/18/22.      Assessment & Plan:  1. Dysuria UA positive. Will treat with Keflex '500mg'$  BID for 7 days. Return to office if symptoms worsen or not resolved in 7 days. Will culture. - Urinalysis, Routine w reflex microscopic - cephALEXin (KEFLEX) 500 MG capsule; Take 1 capsule (500 mg total) by mouth every 12 (twelve) hours for 7 days.  Dispense: 14 capsule; Refill: 0  2. Eye pain, bilateral Patient spoke with her opthamologist who recommended Tobramycin drops and follow up if symptoms not relieved in 5 days. Based on her symptoms of eye pain, photophobia, and vision changes I will place an urgent opthamology referral. - Ambulatory referral to Ophthalmology  3. Acute cystitis with hematuria    Return if symptoms worsen or fail to improve.  Kelli Maid, FNP

## 2022-06-20 DIAGNOSIS — H40013 Open angle with borderline findings, low risk, bilateral: Secondary | ICD-10-CM | POA: Diagnosis not present

## 2022-06-20 DIAGNOSIS — H1045 Other chronic allergic conjunctivitis: Secondary | ICD-10-CM | POA: Diagnosis not present

## 2022-06-20 DIAGNOSIS — H10023 Other mucopurulent conjunctivitis, bilateral: Secondary | ICD-10-CM | POA: Diagnosis not present

## 2022-06-20 DIAGNOSIS — G4733 Obstructive sleep apnea (adult) (pediatric): Secondary | ICD-10-CM | POA: Diagnosis not present

## 2022-06-21 ENCOUNTER — Ambulatory Visit (INDEPENDENT_AMBULATORY_CARE_PROVIDER_SITE_OTHER): Payer: Medicare Other | Admitting: Podiatry

## 2022-06-21 DIAGNOSIS — E119 Type 2 diabetes mellitus without complications: Secondary | ICD-10-CM

## 2022-06-21 DIAGNOSIS — M79675 Pain in left toe(s): Secondary | ICD-10-CM

## 2022-06-21 DIAGNOSIS — B351 Tinea unguium: Secondary | ICD-10-CM

## 2022-06-21 DIAGNOSIS — M79674 Pain in right toe(s): Secondary | ICD-10-CM

## 2022-06-21 LAB — URINE CULTURE
MICRO NUMBER:: 14117933
SPECIMEN QUALITY:: ADEQUATE

## 2022-06-21 NOTE — Progress Notes (Signed)
This patient presents  to my office for at risk foot care.  This patient requires this care by a professional since this patient will be at risk due to having diabetic neuropathy.  Her neuropathy is due to pernicious anemia. She has also been diagnosed with neuromuscular disorder.  This patient is unable to cut nails herself since the patient cannot reach her nails.These nails are painful walking and wearing shoes.  This patient presents for at risk foot care today.  General Appearance  Alert, conversant and in no acute stress.  Vascular  Dorsalis pedis and posterior tibial  pulses are weakly  palpable due to swelling bilaterally.  Capillary return is within normal limits  bilaterally. Temperature is within normal limits  bilaterally.  Neurologic  Senn-Weinstein monofilament wire test absent. bilaterally. Muscle power absent. bilaterally.  Nails Thick disfigured discolored nails with subungual debris  from hallux to fifth toes bilaterally. No evidence of bacterial infection or drainage bilaterally.  Pincer nails noted.  Orthopedic  No limitations of motion  feet .  No crepitus or effusions noted.  No bony pathology or digital deformities noted.  Skin  normotropic skin with no porokeratosis noted bilaterally.  No signs of infections or ulcers noted.     Onychomycosis  Pain in right toes  Pain in left toes  Consent was obtained for treatment procedures.   Mechanical debridement of nails 1-5  bilaterally performed with a nail nipper.  Filed with dremel without incident.    Return office visit   3 months                   Told patient to return for periodic foot care and evaluation due to potential at risk complications.   Edie Vallandingham DPM   

## 2022-07-03 ENCOUNTER — Encounter: Payer: Self-pay | Admitting: Cardiovascular Disease

## 2022-07-03 ENCOUNTER — Ambulatory Visit: Payer: Medicare Other | Attending: Cardiovascular Disease | Admitting: Cardiovascular Disease

## 2022-07-03 VITALS — BP 110/64 | HR 79 | Ht 67.0 in | Wt 247.0 lb

## 2022-07-03 DIAGNOSIS — I10 Essential (primary) hypertension: Secondary | ICD-10-CM

## 2022-07-03 DIAGNOSIS — E782 Mixed hyperlipidemia: Secondary | ICD-10-CM | POA: Diagnosis not present

## 2022-07-03 DIAGNOSIS — I2510 Atherosclerotic heart disease of native coronary artery without angina pectoris: Secondary | ICD-10-CM

## 2022-07-03 DIAGNOSIS — R7303 Prediabetes: Secondary | ICD-10-CM

## 2022-07-03 DIAGNOSIS — I2584 Coronary atherosclerosis due to calcified coronary lesion: Secondary | ICD-10-CM | POA: Diagnosis not present

## 2022-07-03 DIAGNOSIS — G4733 Obstructive sleep apnea (adult) (pediatric): Secondary | ICD-10-CM

## 2022-07-03 DIAGNOSIS — I5189 Other ill-defined heart diseases: Secondary | ICD-10-CM

## 2022-07-03 DIAGNOSIS — R600 Localized edema: Secondary | ICD-10-CM

## 2022-07-03 MED ORDER — HYDROCHLOROTHIAZIDE 12.5 MG PO CAPS: 12.5000 mg | ORAL_CAPSULE | ORAL | 1 refills | Status: AC | PRN

## 2022-07-03 MED ORDER — OMEGA-3-ACID ETHYL ESTERS 1 G PO CAPS: 2.0000 g | ORAL_CAPSULE | Freq: Two times a day (BID) | ORAL | 3 refills | Status: DC

## 2022-07-03 NOTE — Progress Notes (Signed)
Cardiology Office Note    Date:  07/03/2022   ID:  Kelli Moss, DOB 09/04/1950, MRN [id number]  PCP:  Kelli Frizzle, MD  Cardiologist:  Shelva Majestic, MD    6 month F/U cardiology/sleep evaluation initially referred through the courtesy of Dr. Jenna Moss for evaluation of chest pain. Patient also requested establishment of sleep medicine care with me.  History of Present Illness:  Kelli Moss is a 71 y.o. female who was initially referred to me through the courtesy of Dr. Dennard Moss for evaluation of grabbing chest discomfort.  She is the sister of my patient Mr. Kelli Moss.  I saw her for my initial evaluation in December 2019 and last saw her on December 05, 2020.  Kelli Moss has a longstanding history of hypertension for > 20 years and is also felt to have metabolic syndrome.  She has a history of hiatal hernia and celiac disease followed by Dr. Collene Moss as well as a history of pernicious anemia and peripheral neuropathy.  She also has a longstanding history of obstructive sleep apnea and has been on CPAP therapy for over 20 years.  She had been evaluated by Dr. Dennard Moss and during her evaluation she admitted to experiencing episodes of a grabbing-like chest discomfort which is nonexertional but may radiate to her jaws.  She admits to having some anxiety.  She also has had issues with lower extremity edema.  She is extremely sedentary.  She has additional risk factors including a brother with a history of CAD.  She has a remote history of tobacco.  When I initially saw her on July 31, 2018 she had significant tense lower extremity edema to her thighs and was on amlodipine 10 mg and losartan 100 mg for hypertension.  I recommended she decrease amlodipine to 5 mg and with a resting pulse of 95 added beta-blocker therapy with Toprol-XL 25 mg for 2 weeks with titration up to 50 mg.  I also added HCTZ 12.5 mg to help with her edema.  I recommended she undergo a 2D echo Doppler study as  well as a coronary CTA to assess for subclinical atherosclerosis.  She had a significant history of mixed hyperlipidemia and laboratory in October 2019 showed triglycerides of 350, and VLDL of 31.  I recommended the addition of Vascepa 2 capsules twice a day I discussed the importance of weight loss.  She was continuing to use CPAP and is followed by [facility].  She was evaluated by Kelli Moss in April 2021.  Her coronary calcium score was 23 which placed her in the 62nd percentile for age and sex matched control.  She was found to have a 6 mm right middle lobe nodule and on repeat evaluation in July 2020 this was unchanged.  There was diffuse dilation of her esophagus suggestive of esophagitis and dysmotility disorder.  She tells me that she is in need to have another CT since another lung nodule was found.  I saw her on December 05, 2020 at which time she was not having any anginal symptomatology but admitted to occasional "chest cramping " which seem to be precipitated when she would become upset.  She has a history of morbid obesity and at that time was wondering about initiating a keto diet.  I discussed that this was not optimal for heart health and recommended the Mediterranean diet.   During her evaluation, she admitted to 100% compliance with CPAP use.  I last saw her on Dec 19, 2021. Since her prior evaluation she  had issues with blood pressure elevation.  Currently she has been taking amlodipine 10 mg daily, hydrochlorothiazide 12.5 mg but she states she rarely takes this and has only been taking at most 5 times per month.  She has been on losartan 100 mg daily and metoprolol 50 mg daily.  When she recently saw Dr. Dennard Moss on March 31 for blood pressure was elevated in the 831 systolic range.  She also admits to periods where her heart rate increases.  She wishes that I assume her sleep medicine care.  I was able to obtain a sleep study which was interpreted by Kelli Moss at [facility] from December 2017.  This revealed moderate to severe obstructive sleep apnea with an overall AHI of 17 events per hour, RDI 18.3/h NREM AHI was severe at 40/h versus non-REM AHI of 16.7/h.  She brought her machine to the office today.  We were able to obtain a download through her card since her machine set up date was September 15, 2016 with [name] now as her DME company.  A download from November 20, 2021 through Dec 19, 2021 shows 100% usage and average use of 8 hours and 18 minutes.  Her ResMed AirSense 10 AutoSet unit is set at a pressure of 15 cm and AHI 0.5.  She has been using a full facemask, AirTouch F20.  I reviewed her most recent evaluation with Dr. Dennard Moss.  She is not on any therapy for lipid-lowering treatment and apparently although there was suggestion to initiate potential statin this was never done and there is concern of the patient concerning myalgias.  Laboratory from his evaluation in March 2023 shows the LDL of 84 and triglycerides were significantly elevated at 316 despite taking Lovaza 1 capsule twice daily.  Total cholesterol was 155 and HDL DL was low at 33.  Hemoglobin A1c was 6.3 consistent with prediabetes.  There was some discussion of initiating Ozempic to assist with appetite suppression but this has not been done.  I saw her for a yearly cardiology evaluation and to establish sleep medicine care with me.  During that evaluation, she requested to obtain a new CPAP machine since her machine was over 81 years old and she qualified.  [name] was her DME company.  I provided her with 2 sample masks including an AirTouch 20 fullface mask as well as a ResMed air fit F30i mask.  Since I last saw her, she admits to a 30 pound weight loss since initiating therapy with Ozempic.  She was told by her DME that she now qualifies for a new CPAP machine.  She has had some issues with eye infection in June and more recently in October 2023.  She was concerned that this may be resulting from  her CPAP.  She has seen Dr. Carolynn Sayers most recently and is on tobramycin.  She has been on a hypertensive regimen of amlodipine 10 mg, losartan 100 mg, metoprolol succinate 75 mg daily and she had self discontinued HCTZ which had been 25 mg.  She also has not been taking her omega-3 fatty acid which was Lovaza 2 capsules twice a day but has continued to take Zetia 10 mg.  She is on Ozempic now which has been beneficial for weight loss.  Laboratory March 2023 showed total cholesterol 155 HDL 33 LDL 84 but triglycerides remain elevated at 316.  She has continued to use her CPAP which is set at a 15  cm water pressure and AHI is excellent at 1.6.  There is some mask leak.  She had COVID 2 months ago and is back to normal.  She presents for reevaluation.  Past Medical History:  Diagnosis Date   Colon polyp    CTS (carpal tunnel syndrome)    Depression    Diabetes mellitus without complication ([place])    prediabetes   Dyslipidemia    Gastritis    Hypertension    Neuromuscular disorder ([place])    peripheral neuropathy    No past surgical history on file.  Current Medications: Outpatient Medications Prior to Visit  Medication Sig Dispense Refill   amLODipine (NORVASC) 10 MG tablet Take 1 tablet by mouth once daily 90 tablet 3   ammonium lactate (AMLACTIN) 12 % cream Apply topically as needed for dry skin.     aspirin 81 MG tablet Take 81 mg by mouth daily.     CINNAMON PO Take 2,000 mg by mouth daily.     cyanocobalamin (VITAMIN B12) 1000 MCG/ML injection INJECT 1 ML IN THE MUSCLE ONCE MONTHLY 30 mL 3   diazepam (VALIUM) 10 MG tablet TAKE 1 TABLET BY MOUTH EVERY 8 HOURS AS NEEDED FOR MUSCLE SPASM 60 tablet 0   Efinaconazole 10 % SOLN As directed for toenail fungus 8 mL 3   ezetimibe (ZETIA) 10 MG tablet Take 1 tablet (10 mg total) by mouth daily. 90 tablet 3   Flaxseed, Linseed, (FLAX SEEDS PO) Take 1 tablet by mouth daily.     fluticasone (FLONASE) 50 MCG/ACT nasal spray Place into both nostrils  daily.     Grape Seed 100 MG CAPS Take by mouth.     LORazepam (ATIVAN) 1 MG tablet Take 1 tablet (1 mg total) by mouth at bedtime as needed. 90 tablet 0   losartan (COZAAR) 100 MG tablet Take 1 tablet by mouth once daily 90 tablet 3   Magnesium 500 MG TABS Take 500 mg by mouth.     meloxicam (MOBIC) 7.5 MG tablet Take 1 tablet (7.5 mg total) by mouth daily. 30 tablet 0   metoprolol succinate (TOPROL-XL) 50 MG 24 hr tablet TAKE 1.5 TABLET BY MOUTH ONCE DAILY WITH OR IMMEDIATELY FOLLOWING A MEAL 135 tablet 2   Misc Natural Products (BLACK CHERRY CONCENTRATE PO) Take by mouth.     Multiple Vitamins-Minerals (PRESERVISION AREDS 2 PO) Take by mouth.     phenazopyridine (PYRIDIUM) 100 MG tablet Take 1 tablet (100 mg total) by mouth 2 (two) times daily as needed for pain (urinray pain). 20 tablet 0   Polyethyl Glycol-Propyl Glycol (SYSTANE OP) Apply to eye.     Semaglutide, 2 MG/DOSE, 8 MG/3ML SOPN Inject 2 mg as directed once a week. 3 mL 11   tiZANidine (ZANAFLEX) 4 MG tablet TAKE 1 TABLET BY MOUTH EVERY 8 HOURS AS NEEDED FOR MUSCLE SPASM 90 tablet 0   TURMERIC PO Take 1,000 mg by mouth.     UNABLE TO FIND Med Name: TENS UNIT DX: M54.2; M54.9 1 Units 0   Vitamin D, Ergocalciferol, (DRISDOL) 1.25 MG (50000 UNIT) CAPS capsule Take 1 capsule (50,000 Units total) by mouth every 7 (seven) days. 12 capsule 0   hydrochlorothiazide (MICROZIDE) 12.5 MG capsule Take 1 capsule (12.5 mg total) by mouth daily. 90 capsule 3   omega-3 acid ethyl esters (LOVAZA) 1 g capsule Take 2 capsules (2 g total) by mouth 2 (two) times daily. 180 capsule 0   No facility-administered medications prior to visit.  Allergies:   Lyrica [pregabalin]   Social History   Socioeconomic History   Marital status: Married    Spouse name: Not on file   Number of children: Not on file   Years of education: Not on file   Highest education level: Not on file  Occupational History   Not on file  Tobacco Use   Smoking status:  Former    Types: Cigarettes    Quit date: 01/23/1988    Years since quitting: 34.4   Smokeless tobacco: Never  Vaping Use   Vaping Use: Never used  Substance and Sexual Activity   Alcohol use: No   Drug use: No   Sexual activity: Not on file  Other Topics Concern   Not on file  Social History Narrative   Not on file   Social Determinants of Health   Financial Resource Strain: Low Risk  (04/01/2020)   Overall Financial Resource Strain (CARDIA)    Difficulty of Paying Living Expenses: Not very hard  Food Insecurity: Not on file  Transportation Needs: Not on file  Physical Activity: Not on file  Stress: Not on file  Social Connections: Not on file     Family History:  The patient's family history is not on file.  Her mother died at age 68 with pancreatic cancer.  Father died at age 62 with bladder cancer.  She has a 49 year old brother who has hypertension and CAD who is my patient Kelli Moss.)  ROS General: Negative; No fevers, chills, or night sweats; obesity HEENT: Negative; No changes in vision or hearing, sinus congestion, difficulty swallowing Pulmonary: Negative; No cough, wheezing, shortness of breath, hemoptysis Cardiovascular: See HPI GI: Negative; No nausea, vomiting, diarrhea, or abdominal pain GU: Negative; No dysuria, hematuria, or difficulty voiding Musculoskeletal: Negative; no myalgias, joint pain, or weakness Hematologic/Oncology: Negative; no easy bruising, bleeding Endocrine: metabolic syndrome Neuro: Negative; no changes in balance, headaches Skin: Negative; No rashes or skin lesions Psychiatric: Negative; No behavioral problems, depression Sleep: OSA on CPAP for >20 years; most recent sleep study 2017 showed moderately severe sleep apnea with severe sleep apnea during REM sleep.  She also had occasional periodic limb movement of sleep.  She is compliant with therapy.  Other comprehensive 14 point system review is negative.   PHYSICAL EXAM:   VS:   BP 110/64   Pulse 79   Ht _0  (1.702 m)   Wt 247 lb (112 kg)   SpO2 96%   BMI 38.69 kg/m     Repeat blood pressure by me was 120/66  Wt Readings from Last 3 Encounters:  07/03/22 247 lb (112 kg)  06/18/22 232 lb (105.2 kg)  03/26/22 238 lb 12.8 oz (108.3 kg)    General: Alert, oriented, no distress.  Weight loss of approximately 30 pounds since her last visit Skin: normal turgor, no rashes, warm and dry HEENT: Normocephalic, atraumatic. Pupils equal round and reactive to light; sclera anicteric; extraocular muscles intact;  Nose without nasal septal hypertrophy Mouth/Parynx benign; Mallinpatti scale 3 Neck: No JVD, no carotid bruits; normal carotid upstroke Lungs: clear to ausculatation and percussion; no wheezing or rales Chest wall: without tenderness to palpitation Heart: PMI not displaced, RRR, s1 s2 normal, 1/6 systolic murmur, no diastolic murmur, no rubs, gallops, thrills, or heaves Abdomen: soft, nontender; no hepatosplenomehaly, BS+; abdominal aorta nontender and not dilated by palpation. Back: no CVA tenderness Pulses 2+ Musculoskeletal: full range of motion, normal strength, no joint deformities Extremities: no clubbing cyanosis or edema,  Homan's sign negative  Neurologic: grossly nonfocal; Cranial nerves grossly wnl Psychologic: Normal mood and affect    Studies/Labs Reviewed:   July 03, 2022 ECG (independently read by me): NSR at 79, RBBB  Dec 19, 2021 ECG (independently read by me): NSR at 79, nonspecific intraventricular block  April 18,2022 ECG (independently read by me): NSR at 81, RBBB, QTc 497, no ectopy  December 2019 ECG (independently read by me): Normal sinus rhythm at 95 bpm.  Right bundle branch block with repolarization changes.  No ectopy.  Recent Labs:    Latest Ref Rng & Units 03/26/2022    8:22 AM 11/03/2021    8:46 AM 02/13/2021   10:06 AM  BMP  Glucose 65 - 99 mg/dL 103  135  146   BUN 7 - 25 mg/dL _0 Creatinine 0.60 -  1.00 mg/dL 0.71  0.56  0.53   BUN/Creat Ratio 6 - 22 (calc) SEE NOTE:  23    Sodium 135 - 146 mmol/L 138  140  138   Potassium 3.5 - 5.3 mmol/L 3.9  3.6  3.7   Chloride 98 - 110 mmol/L 104  104  104   CO2 20 - 32 mmol/L _1 Calcium 8.6 - 10.4 mg/dL 9.3  9.1  8.9    8.9         Latest Ref Rng & Units 03/26/2022    8:22 AM 11/03/2021    8:46 AM 02/13/2021   10:06 AM  Hepatic Function  Total Protein 6.1 - 8.1 g/dL 7.1  7.0  7.0   Albumin 3.5 - 5.2 g/dL   4.0   AST 10 - 35 U/L _2 ALT 6 - 29 U/L _3 Alk Phosphatase 39 - 117 U/L   58   Total Bilirubin 0.2 - 1.2 mg/dL 0.6  0.7  0.5        Latest Ref Rng & Units 11/03/2021    8:46 AM 02/13/2021   10:06 AM 10/27/2020    8:37 AM  CBC  WBC 3.8 - 10.8 Thousand/uL 9.0  8.4  6.9   Hemoglobin 11.7 - 15.5 g/dL 13.6  12.8  13.2   Hematocrit 35.0 - 45.0 % 41.1  38.1  39.4   Platelets 140 - 400 Thousand/uL 263  236.0  215    Lab Results  Component Value Date   MCV 84.0 11/03/2021   MCV 84.0 02/13/2021   MCV 84.4 10/27/2020   Lab Results  Component Value Date   TSH 1.62 02/13/2021   Lab Results  Component Value Date   HGBA1C 5.1 03/26/2022     BNP No results found for: "BNP"  ProBNP No results found for: "PROBNP"   Lipid Panel     Component Value Date/Time   CHOL 155 11/03/2021 0846   TRIG 316 (H) 11/03/2021 0846   HDL 33 (L) 11/03/2021 0846   CHOLHDL 4.7 11/03/2021 0846   VLDL 48 (H) 12/27/2016 0809   LDLCALC 84 11/03/2021 0846     RADIOLOGY: No results found.   Additional studies/ records that were reviewed today include:  I reviewed the records of Dr. Dennard Moss as well as her recent laboratory.  I have I obtain the results of her most recent sleep study interpreted by Dr. Rexene Alberts at [facility] from August 06, 2016.  She brought her machine with her today.  A download was  obtained.  Her machine was linked to our office for establishment of follow-up   ASSESSMENT:    1.  Essential hypertension   2. OSA (obstructive sleep apnea)   3. Mixed hyperlipidemia   4. Leg edema   5. Morbid obesity (Kelli Moss)   6. Coronary artery calcification   7. Grade I diastolic dysfunction   8. Prediabetes     PLAN:  Kelli Moss is a 71 year old female patient who is followed by Dr. Dennard Moss and has a longstanding history of hypertension for greater than 20 years as well as obstructive sleep apnea for which she has been on CPAP therapy. She has a longstanding history of morbid obesity and has metabolic syndrome/prediabetes..  She was diagnosed with a hiatal hernia and also has a history of pernicious anemia as well as peripheral neuropathy.  When I initially saw her she had tense lower extremity edema did improve with medication adjustment and diuretic therapy.  An echo Doppler study in December 2019 showed hyperdynamic LV function with EF 65 to 66%, grade 1 diastolic dysfunction, mild left atrial enlargement, and moderate LVH.  She had a coronary calcium score of 23.  Since her initial evaluation in 2019 I had not seen her in over 2-1/2 years until her last evaluation in April 2022.  At that time, she was back on amlodipine at 10 mg, and was on metoprolol succinate 50 mg daily, losartan 100 mg, and HCTZ 12.5 mg.  There is no significant edema today.  She did have mild tenderness in the back of her left calf.  There was no palpable cord.  There was no swelling.  Kelli Moss' sign was negative.  She has experienced atypical chest pain with chest cramping particularly when she was upset which I felt was most likely nonischemic and more musculoskeletal in etiology.  In addition there was some concern for esophageal dysmotility disorder.  Recently, her blood pressure has been noted to be elevated.  Her blood pressure today on recheck by me was 160/70.  She also has noticed that time her heart rate has increased and there is some occasional swelling.  As result, I have recommended further titration of  her metoprolol succinate from 50 mg up to 75 mg.  I have suggested that she try taking HCTZ 12.5 mg at least twice per week and depending upon blood pressure and edema this can be further increased.  She has been documented to have coronary calcification and is not on any statin due to concerns of myalgias and intolerance.  At her last office visit, I reviewed laboratory from Dr. Dennard Moss and with her LDL at 84 suggested initiation of Zetia 10 mg.  Her triglycerides continue to be elevated at 316 despite taking Lovaza twice a day and I suggested further titration to 2 capsules twice a day.  Presently, her blood pressure is stable on her regimen of amlodipine 10 mg, losartan 100 mg daily and metoprolol succinate 75 mg daily.  At times she has noted some intermittent leg edema and had self discontinued her hydrochlorothiazide.  I am renewing HCTZ 12.5 mg and have suggested she take this on a as needed basis rather than daily.  I reviewed recent laboratory from March 2023 which continues to show elevated triglycerides at 316.  I have recommended she resume Lovaza 2 capsules twice a day.  She is qualified to obtain a new CPAP machine and I will place an order for her to have a ResMed AirSense 11 CPAP AutoSet unit.  Currently she continues to meet compliance standards.  However she typically goes to bed at 10 PM and wakes up at 3 AM.  I have strongly recommended she sleep a minimum of 7 hours with CPAP therapy on a daily basis.  She has had 2 eye infections.  I discussed with her that there is the nasal lacrimal duct which connects the nose to the eye.  We stressed the importance of cleaning her CPAP supplies.  She had previously seen and optometrist and now is seeing Dr. Katy Fitch who is following her.  I commended her on her weight loss and discussed the importance of continued weight loss and exercise.  I discussed current recommendations of the American Heart Association exercising at least 30 minutes for 5 days a week if  at all possible of moderate intensity.  I will see her in 4 months for reevaluation or sooner as needed  Medication Adjustments/Labs and Tests Ordered: Current medicines are reviewed at length with the patient today.  Concerns regarding medicines are outlined above.  Medication changes, Labs and Tests ordered today are listed in the Patient Instructions below. Patient Instructions  Medication Instructions:  Your physician has recommended you make the following change in your medication:  DECREASE: Hydrochlorothiazide 12.76m as needed  *If you need a refill on your cardiac medications before your next appointment, please call your pharmacy*   Lab Work: NONE If you have labs (blood work) drawn today and your tests are completely normal, you will receive your results only by: [place](if you have MyChart) OR A paper copy in the mail If you have any lab test that is abnormal or we need to change your treatment, we will call you to review the results.   Testing/Procedures: NONE   Follow-Up: [facility] and your health needs are our priority.  As part of our continuing mission to provide you with exceptional heart care, we have created designated Provider Care Teams.  These Care Teams include your primary Cardiologist (physician) and Advanced Practice Providers (APPs -  Physician Assistants and Nurse Practitioners) who all work together to provide you with the care you need, when you need it.  We recommend signing up for the patient portal called "MyChart".  Sign up information is provided on this After Visit Summary.  MyChart is used to connect with patients for Virtual Visits (Telemedicine).  Patients are able to view lab/test results, encounter notes, upcoming appointments, etc.  Non-urgent messages can be sent to your provider as well.   To learn more about what you can do with MyChart, go to [email or web address]    Your next appointment:   4 month(s)  The  format for your next appointment:   In Person  Provider:   TShelva Majestic MD     Signed, TShelva Majestic MD  07/03/2022 12:05 PM    [address] [place]   [id number]: [phone]

## 2022-07-03 NOTE — Patient Instructions (Signed)
Medication Instructions:  Your physician has recommended you make the following change in your medication:  DECREASE: Hydrochlorothiazide 12.'5mg'$  as needed  *If you need a refill on your cardiac medications before your next appointment, please call your pharmacy*   Lab Work: NONE If you have labs (blood work) drawn today and your tests are completely normal, you will receive your results only by: [place] (if you have MyChart) OR A paper copy in the mail If you have any lab test that is abnormal or we need to change your treatment, we will call you to review the results.   Testing/Procedures: NONE   Follow-Up: At [facility], you and your health needs are our priority.  As part of our continuing mission to provide you with exceptional heart care, we have created designated Provider Care Teams.  These Care Teams include your primary Cardiologist (physician) and Advanced Practice Providers (APPs -  Physician Assistants and Nurse Practitioners) who all work together to provide you with the care you need, when you need it.  We recommend signing up for the patient portal called "MyChart".  Sign up information is provided on this After Visit Summary.  MyChart is used to connect with patients for Virtual Visits (Telemedicine).  Patients are able to view lab/test results, encounter notes, upcoming appointments, etc.  Non-urgent messages can be sent to your provider as well.   To learn more about what you can do with MyChart, go to [email or web address].    Your next appointment:   4 month(s)  The format for your next appointment:   In Person  Provider:   Shelva Majestic, MD

## 2022-07-05 ENCOUNTER — Telehealth: Payer: Self-pay | Admitting: Family Medicine

## 2022-07-05 NOTE — Telephone Encounter (Signed)
Spoke with patient to schedule  AWV  Patient declined and requested do not call  She wants to see provider only

## 2022-07-19 DIAGNOSIS — H2513 Age-related nuclear cataract, bilateral: Secondary | ICD-10-CM | POA: Diagnosis not present

## 2022-07-19 DIAGNOSIS — H10023 Other mucopurulent conjunctivitis, bilateral: Secondary | ICD-10-CM | POA: Diagnosis not present

## 2022-07-19 DIAGNOSIS — G4733 Obstructive sleep apnea (adult) (pediatric): Secondary | ICD-10-CM | POA: Diagnosis not present

## 2022-07-19 DIAGNOSIS — H18832 Recurrent erosion of cornea, left eye: Secondary | ICD-10-CM | POA: Diagnosis not present

## 2022-07-19 DIAGNOSIS — H1045 Other chronic allergic conjunctivitis: Secondary | ICD-10-CM | POA: Diagnosis not present

## 2022-07-19 DIAGNOSIS — H40013 Open angle with borderline findings, low risk, bilateral: Secondary | ICD-10-CM | POA: Diagnosis not present

## 2022-08-02 DIAGNOSIS — H16212 Exposure keratoconjunctivitis, left eye: Secondary | ICD-10-CM | POA: Diagnosis not present

## 2022-08-02 DIAGNOSIS — H18832 Recurrent erosion of cornea, left eye: Secondary | ICD-10-CM | POA: Diagnosis not present

## 2022-08-06 LAB — HM DIABETES EYE EXAM

## 2022-09-05 DIAGNOSIS — H16213 Exposure keratoconjunctivitis, bilateral: Secondary | ICD-10-CM | POA: Diagnosis not present

## 2022-09-05 DIAGNOSIS — H18833 Recurrent erosion of cornea, bilateral: Secondary | ICD-10-CM | POA: Diagnosis not present

## 2022-09-11 ENCOUNTER — Encounter: Payer: Self-pay | Admitting: Family Medicine

## 2022-09-12 ENCOUNTER — Telehealth: Payer: Self-pay | Admitting: Family Medicine

## 2022-09-12 NOTE — Telephone Encounter (Signed)
Patient called stating that she received a jury duty notice and does not feel that she can serve with her neck and hip issues. She said that it is still hard for her to sit and/or walk and asked if Dr Kelli Moss could write a note for her to get out of jury duty?  Please advise.

## 2022-09-12 NOTE — Telephone Encounter (Signed)
Sent patient MyChart message with recommendations.

## 2022-09-17 ENCOUNTER — Telehealth: Payer: Self-pay | Admitting: Family Medicine

## 2022-09-17 DIAGNOSIS — H0288B Meibomian gland dysfunction left eye, upper and lower eyelids: Secondary | ICD-10-CM | POA: Diagnosis not present

## 2022-09-17 DIAGNOSIS — H0288A Meibomian gland dysfunction right eye, upper and lower eyelids: Secondary | ICD-10-CM | POA: Diagnosis not present

## 2022-09-17 DIAGNOSIS — H18593 Other hereditary corneal dystrophies, bilateral: Secondary | ICD-10-CM | POA: Diagnosis not present

## 2022-09-17 DIAGNOSIS — H04123 Dry eye syndrome of bilateral lacrimal glands: Secondary | ICD-10-CM | POA: Diagnosis not present

## 2022-09-17 DIAGNOSIS — H18833 Recurrent erosion of cornea, bilateral: Secondary | ICD-10-CM | POA: Diagnosis not present

## 2022-09-17 LAB — HM DIABETES EYE EXAM

## 2022-09-17 NOTE — Telephone Encounter (Signed)
Patient came to the office to request a letter to excuse her from jury duty due to neck and lower back pain. Patient also provided extra copy of MRI for provider's review. Copy of MRI placed in folder for nurse.   Please advise at [phone], or [phone] when letter ready for pickup.

## 2022-09-17 NOTE — Telephone Encounter (Signed)
Pt came in office for response. Does NOT use MyChart, deactivated at pt request. Appt made

## 2022-09-18 ENCOUNTER — Other Ambulatory Visit: Payer: Self-pay | Admitting: Family Medicine

## 2022-10-04 ENCOUNTER — Encounter: Payer: Self-pay | Admitting: Podiatry

## 2022-10-04 ENCOUNTER — Ambulatory Visit (INDEPENDENT_AMBULATORY_CARE_PROVIDER_SITE_OTHER): Payer: Medicare Other | Admitting: Podiatry

## 2022-10-04 VITALS — BP 166/81 | HR 82

## 2022-10-04 DIAGNOSIS — B351 Tinea unguium: Secondary | ICD-10-CM | POA: Diagnosis not present

## 2022-10-04 DIAGNOSIS — M79674 Pain in right toe(s): Secondary | ICD-10-CM

## 2022-10-04 DIAGNOSIS — E119 Type 2 diabetes mellitus without complications: Secondary | ICD-10-CM

## 2022-10-04 DIAGNOSIS — M79675 Pain in left toe(s): Secondary | ICD-10-CM

## 2022-10-04 NOTE — Progress Notes (Signed)
This patient presents  to my office for at risk foot care.  This patient requires this care by a professional since this patient will be at risk due to having diabetic neuropathy.  Her neuropathy is due to pernicious anemia. She has also been diagnosed with neuromuscular disorder.  This patient is unable to cut nails herself since the patient cannot reach her nails.These nails are painful walking and wearing shoes.  This patient presents for at risk foot care today.  General Appearance  Alert, conversant and in no acute stress.  Vascular  Dorsalis pedis and posterior tibial  pulses are weakly  palpable due to swelling bilaterally.  Capillary return is within normal limits  bilaterally. Temperature is within normal limits  bilaterally.  Neurologic  Senn-Weinstein monofilament wire test absent. bilaterally. Muscle power absent. bilaterally.  Nails Thick disfigured discolored nails with subungual debris  from hallux to fifth toes bilaterally. No evidence of bacterial infection or drainage bilaterally.  Pincer nails noted.  Orthopedic  No limitations of motion  feet .  No crepitus or effusions noted.  No bony pathology or digital deformities noted.  Skin  normotropic skin with no porokeratosis noted bilaterally.  No signs of infections or ulcers noted.     Onychomycosis  Pain in right toes  Pain in left toes  Consent was obtained for treatment procedures.   Mechanical debridement of nails 1-5  bilaterally performed with a nail nipper.  Filed with dremel without incident.    Return office visit   3 months                   Told patient to return for periodic foot care and evaluation due to potential at risk complications.   Gardiner Barefoot DPM

## 2022-10-11 ENCOUNTER — Ambulatory Visit: Payer: Medicare Other | Admitting: Family Medicine

## 2022-10-29 ENCOUNTER — Telehealth: Payer: Self-pay

## 2022-10-29 NOTE — Telephone Encounter (Signed)
Voicemail received from pt to confirm if we have received OV notes from her eye doctors Clent Jacks and Annia Belt.   Note: Care Team updated.

## 2022-11-07 ENCOUNTER — Other Ambulatory Visit: Payer: Medicare Other

## 2022-11-07 DIAGNOSIS — E785 Hyperlipidemia, unspecified: Secondary | ICD-10-CM | POA: Diagnosis not present

## 2022-11-07 DIAGNOSIS — E8881 Metabolic syndrome: Secondary | ICD-10-CM | POA: Diagnosis not present

## 2022-11-07 DIAGNOSIS — G709 Myoneural disorder, unspecified: Secondary | ICD-10-CM

## 2022-11-07 DIAGNOSIS — I10 Essential (primary) hypertension: Secondary | ICD-10-CM | POA: Diagnosis not present

## 2022-11-07 DIAGNOSIS — E119 Type 2 diabetes mellitus without complications: Secondary | ICD-10-CM | POA: Diagnosis not present

## 2022-11-08 LAB — CBC WITH DIFFERENTIAL/PLATELET
Absolute Monocytes: 476 cells/uL (ref 200–950)
Basophils Absolute: 68 cells/uL (ref 0–200)
Basophils Relative: 1 %
Eosinophils Absolute: 170 cells/uL (ref 15–500)
Eosinophils Relative: 2.5 %
HCT: 45.3 % — ABNORMAL HIGH (ref 35.0–45.0)
Hemoglobin: 15.1 g/dL (ref 11.7–15.5)
Lymphs Abs: 2162 cells/uL (ref 850–3900)
MCH: 28.5 pg (ref 27.0–33.0)
MCHC: 33.3 g/dL (ref 32.0–36.0)
MCV: 85.5 fL (ref 80.0–100.0)
MPV: 10.3 fL (ref 7.5–12.5)
Monocytes Relative: 7 %
Neutro Abs: 3924 cells/uL (ref 1500–7800)
Neutrophils Relative %: 57.7 %
Platelets: 282 10*3/uL (ref 140–400)
RBC: 5.3 10*6/uL — ABNORMAL HIGH (ref 3.80–5.10)
RDW: 13 % (ref 11.0–15.0)
Total Lymphocyte: 31.8 %
WBC: 6.8 10*3/uL (ref 3.8–10.8)

## 2022-11-08 LAB — COMPLETE METABOLIC PANEL WITH GFR
AG Ratio: 1.4 (calc) (ref 1.0–2.5)
ALT: 16 U/L (ref 6–29)
AST: 15 U/L (ref 10–35)
Albumin: 4.2 g/dL (ref 3.6–5.1)
Alkaline phosphatase (APISO): 72 U/L (ref 37–153)
BUN: 14 mg/dL (ref 7–25)
CO2: 21 mmol/L (ref 20–32)
Calcium: 9.4 mg/dL (ref 8.6–10.4)
Chloride: 107 mmol/L (ref 98–110)
Creat: 0.61 mg/dL (ref 0.60–1.00)
Globulin: 3.1 g/dL (calc) (ref 1.9–3.7)
Glucose, Bld: 97 mg/dL (ref 65–99)
Potassium: 4.3 mmol/L (ref 3.5–5.3)
Sodium: 141 mmol/L (ref 135–146)
Total Bilirubin: 0.5 mg/dL (ref 0.2–1.2)
Total Protein: 7.3 g/dL (ref 6.1–8.1)
eGFR: 96 mL/min/{1.73_m2} (ref >=60)

## 2022-11-08 LAB — HEMOGLOBIN A1C
Hgb A1c MFr Bld: 5.3 % of total Hgb (ref <5.7)
Mean Plasma Glucose: 105 mg/dL
eAG (mmol/L): 5.8 mmol/L

## 2022-11-08 LAB — LIPID PANEL
Cholesterol: 159 mg/dL (ref <200)
HDL: 35 mg/dL — ABNORMAL LOW (ref >=50)
LDL Cholesterol (Calc): 86 mg/dL (calc)
Non-HDL Cholesterol (Calc): 124 mg/dL (calc) (ref <130)
Total CHOL/HDL Ratio: 4.5 (calc) (ref <5.0)
Triglycerides: 317 mg/dL — ABNORMAL HIGH (ref <150)

## 2022-11-08 LAB — MICROALBUMIN / CREATININE URINE RATIO
Creatinine, Urine: 118 mg/dL (ref 20–275)
Microalb Creat Ratio: 35 mg/g creat — ABNORMAL HIGH (ref <30)
Microalb, Ur: 4.1 mg/dL

## 2022-11-08 LAB — VITAMIN B12: Vitamin B-12: 459 pg/mL (ref 200–1100)

## 2022-11-12 ENCOUNTER — Encounter: Payer: Self-pay | Admitting: Family Medicine

## 2022-11-13 ENCOUNTER — Ambulatory Visit (INDEPENDENT_AMBULATORY_CARE_PROVIDER_SITE_OTHER): Payer: Medicare Other | Admitting: Family Medicine

## 2022-11-13 ENCOUNTER — Encounter: Payer: Self-pay | Admitting: Family Medicine

## 2022-11-13 VITALS — BP 120/64 | HR 69 | Temp 97.8°F | Ht 67.0 in | Wt 219.8 lb

## 2022-11-13 DIAGNOSIS — Z1231 Encounter for screening mammogram for malignant neoplasm of breast: Secondary | ICD-10-CM

## 2022-11-13 DIAGNOSIS — M5030 Other cervical disc degeneration, unspecified cervical region: Secondary | ICD-10-CM | POA: Diagnosis not present

## 2022-11-13 DIAGNOSIS — Z0000 Encounter for general adult medical examination without abnormal findings: Secondary | ICD-10-CM

## 2022-11-13 DIAGNOSIS — Z1211 Encounter for screening for malignant neoplasm of colon: Secondary | ICD-10-CM

## 2022-11-13 DIAGNOSIS — E119 Type 2 diabetes mellitus without complications: Secondary | ICD-10-CM | POA: Diagnosis not present

## 2022-11-13 DIAGNOSIS — Z0001 Encounter for general adult medical examination with abnormal findings: Secondary | ICD-10-CM | POA: Diagnosis not present

## 2022-11-13 DIAGNOSIS — E785 Hyperlipidemia, unspecified: Secondary | ICD-10-CM

## 2022-11-13 DIAGNOSIS — E8881 Metabolic syndrome: Secondary | ICD-10-CM

## 2022-11-13 MED ORDER — ATORVASTATIN CALCIUM 10 MG PO TABS: 10.0000 mg | ORAL_TABLET | Freq: Every day | ORAL | 3 refills | Status: DC

## 2022-11-13 NOTE — Progress Notes (Signed)
Subjective:    Patient ID: Kelli Moss, female    DOB: 07-17-1951, 72 y.o.   MRN: [id number]  HPI  Wt Readings from Last 3 Encounters:  11/13/22 219 lb 12.8 oz (99.7 kg)  07/03/22 247 lb (112 kg)  06/18/22 232 lb (105.2 kg)    Patient is here today for complete physical exam.  Patient has lost 30 pounds since I last saw her.  I congratulated her on this.  Her blood pressure today is well-controlled at 120/64.  Her most recent lab work is excellent except for her LDL cholesterol is above goal.  Ideally I like to be under 55 given her history of coronary artery disease.  The patient is not sure that she is taking Zetia.  She had a previous bad reaction to Crestor.  We discussed trying Lipitor and she would be willing to try this.  She is due for mammogram.  She will allow me to schedule this for her.  She is overdue for colonoscopy.  Last year she refused a colonoscopy.  This year she declines a colonoscopy but she will consent to Cologuard.  She does have a history of colon polyps.  I realize this is not the ideal screening method for this patient however I would prefer any screening over no screening at all.  She declines a bone density test.  She is due for the RSV vaccine as well as a COVID booster. Immunization History  Administered Date(s) Administered   Fluad Quad(high Dose 65+) 06/11/2019, 07/11/2020   Influenza, High Dose Seasonal PF 06/12/2018   Influenza,inj,Quad PF,6+ Mos 05/10/2014   PFIZER(Purple Top)SARS-COV-2 Vaccination 09/15/2019, 10/06/2019, 06/14/2020   Pneumococcal Conjugate-13 06/12/2018   Pneumococcal Polysaccharide-23 12/31/2016   Tdap 03/31/2009   Zoster Recombinat (Shingrix) 04/12/2022   Zoster, Live 05/30/2011    Abstract on 11/12/2022  Component Date Value Ref Range Status   HM Diabetic Eye Exam 09/17/2022 No Retinopathy  No Retinopathy Final  Lab on 11/07/2022  Component Date Value Ref Range Status   Vitamin B-12 11/07/2022 459  200 - 1,100 pg/mL Final    Creatinine, Urine 11/07/2022 118  20 - 275 mg/dL Final   Microalb, Ur 11/07/2022 4.1  mg/dL Final   Comment: Reference Range Not established    Microalb Creat Ratio 11/07/2022 35 (H)  <30 mg/g creat Final   Comment: . The ADA defines abnormalities in albumin excretion as follows: Marland Kitchen Albuminuria Category        Result (mg/g creatinine) . Normal to Mildly increased   <30 Moderately increased         30-299  Severely increased           > OR = 300 . The ADA recommends that at least two of three specimens collected within a 3-6 month period be abnormal before considering a patient to be within a diagnostic category.    Hgb A1c MFr Bld 11/07/2022 5.3  <5.7 % of total Hgb Final   Comment: For the purpose of screening for the presence of diabetes: . <5.7%       Consistent with the absence of diabetes 5.7-6.4%    Consistent with increased risk for diabetes             (prediabetes) > or =6.5%  Consistent with diabetes . This assay result is consistent with a decreased risk of diabetes. . Currently, no consensus exists regarding use of hemoglobin A1c for diagnosis of diabetes in children. . According to American Diabetes Association (ADA)  guidelines, hemoglobin A1c <7.0% represents optimal control in non-pregnant diabetic patients. Different metrics may apply to specific patient populations.  Standards of Medical Care in Diabetes(ADA). .    Mean Plasma Glucose 11/07/2022 105  mg/dL Final   eAG (mmol/L) 11/07/2022 5.8  mmol/L Final   Comment: . This test was performed on the Roche cobas c503 platform. Effective 05/28/22, a change in test platforms from the Abbott Architect to the Roche cobas c503 may have shifted HbA1c results compared to historical results. Based on laboratory validation testing conducted at [place] relative to the Abbott platform had an average increase in HbA1c value of < or = 0.3%. This difference is within accepted  variability  established by the [facility]. Note that not all individuals will have had a shift in their results and direct comparisons between historical and current results for testing conducted on different platforms is not recommended.    Cholesterol 11/07/2022 159  <200 mg/dL Final   HDL 11/07/2022 35 (L)  > OR = 50 mg/dL Final   Triglycerides 11/07/2022 317 (H)  <150 mg/dL Final   Comment: . If a non-fasting specimen was collected, consider repeat triglyceride testing on a fasting specimen if clinically indicated.  Yates Decamp et al. J. of Clin. Lipidol. [id number]. Marland Kitchen    LDL Cholesterol (Calc) 11/07/2022 86  mg/dL (calc) Final   Comment: Reference range: <100 . Desirable range <100 mg/dL for primary prevention;   <70 mg/dL for patients with CHD or diabetic patients  with > or = 2 CHD risk factors. Marland Kitchen LDL-C is now calculated using the Martin-Hopkins  calculation, which is a validated novel method providing  better accuracy than the Friedewald equation in the  estimation of LDL-C.  Cresenciano Genre et al. Annamaria Helling. [id number]): 2061-2068  (http://education.[facility].com/faq/FAQ164)    Total CHOL/HDL Ratio 11/07/2022 4.5  <5.0 (calc) Final   Non-HDL Cholesterol (Calc) 11/07/2022 124  <130 mg/dL (calc) Final   Comment: For patients with diabetes plus 1 major ASCVD risk  factor, treating to a non-HDL-C goal of <100 mg/dL  (LDL-C of <70 mg/dL) is considered a therapeutic  option.    WBC 11/07/2022 6.8  3.8 - 10.8 Thousand/uL Final   RBC 11/07/2022 5.30 (H)  3.80 - 5.10 Million/uL Final   Hemoglobin 11/07/2022 15.1  11.7 - 15.5 g/dL Final   HCT 11/07/2022 45.3 (H)  35.0 - 45.0 % Final   MCV 11/07/2022 85.5  80.0 - 100.0 fL Final   MCH 11/07/2022 28.5  27.0 - 33.0 pg Final   MCHC 11/07/2022 33.3  32.0 - 36.0 g/dL Final   RDW 11/07/2022 13.0  11.0 - 15.0 % Final   Platelets 11/07/2022 282  140 - 400 Thousand/uL Final   MPV 11/07/2022 10.3  7.5 - 12.5  fL Final   Neutro Abs 11/07/2022 3,924  1,500 - 7,800 cells/uL Final   Lymphs Abs 11/07/2022 2,162  850 - 3,900 cells/uL Final   Absolute Monocytes 11/07/2022 476  200 - 950 cells/uL Final   Eosinophils Absolute 11/07/2022 170  15 - 500 cells/uL Final   Basophils Absolute 11/07/2022 68  0 - 200 cells/uL Final   Neutrophils Relative % 11/07/2022 57.7  % Final   Total Lymphocyte 11/07/2022 31.8  % Final   Monocytes Relative 11/07/2022 7.0  % Final   Eosinophils Relative 11/07/2022 2.5  % Final   Basophils Relative 11/07/2022 1.0  % Final   Glucose, Bld 11/07/2022 97  65 - 99 mg/dL Final  Comment: .            Fasting reference interval .    BUN 11/07/2022 14  7 - 25 mg/dL Final   Creat 11/07/2022 0.61  0.60 - 1.00 mg/dL Final   eGFR 11/07/2022 96  > OR = 60 mL/min/1.25m2 Final   BUN/Creatinine Ratio 11/07/2022 SEE NOTE:  6 - 22 (calc) Final   Comment:    Not Reported: BUN and Creatinine are within    reference range. .    Sodium 11/07/2022 141  135 - 146 mmol/L Final   Potassium 11/07/2022 4.3  3.5 - 5.3 mmol/L Final   Chloride 11/07/2022 107  98 - 110 mmol/L Final   CO2 11/07/2022 21  20 - 32 mmol/L Final   Calcium 11/07/2022 9.4  8.6 - 10.4 mg/dL Final   Total Protein 11/07/2022 7.3  6.1 - 8.1 g/dL Final   Albumin 11/07/2022 4.2  3.6 - 5.1 g/dL Final   Globulin 11/07/2022 3.1  1.9 - 3.7 g/dL (calc) Final   AG Ratio 11/07/2022 1.4  1.0 - 2.5 (calc) Final   Total Bilirubin 11/07/2022 0.5  0.2 - 1.2 mg/dL Final   Alkaline phosphatase (APISO) 11/07/2022 72  37 - 153 U/L Final   AST 11/07/2022 15  10 - 35 U/L Final   ALT 11/07/2022 16  6 - 29 U/L Final     Past Medical History:  Diagnosis Date   Colon polyp    CTS (carpal tunnel syndrome)    Depression    Diabetes mellitus without complication ([place])    prediabetes   Dyslipidemia    Gastritis    Hypertension    Neuromuscular disorder ([place])    peripheral neuropathy   No past surgical history on file. Current Outpatient  Medications on File Prior to Visit  Medication Sig Dispense Refill   amLODipine (NORVASC) 10 MG tablet Take 1 tablet by mouth once daily 90 tablet 3   ammonium lactate (AMLACTIN) 12 % cream Apply topically as needed for dry skin.     aspirin 81 MG tablet Take 81 mg by mouth daily.     CINNAMON PO Take 2,000 mg by mouth daily.     cyanocobalamin (VITAMIN B12) 1000 MCG/ML injection INJECT 1 ML IN THE MUSCLE ONCE MONTHLY 30 mL 3   diazepam (VALIUM) 10 MG tablet TAKE 1 TABLET BY MOUTH EVERY 8 HOURS AS NEEDED FOR MUSCLE SPASM 60 tablet 0   Efinaconazole 10 % SOLN As directed for toenail fungus 8 mL 3   ezetimibe (ZETIA) 10 MG tablet Take 1 tablet (10 mg total) by mouth daily. 90 tablet 3   Flaxseed, Linseed, (FLAX SEEDS PO) Take 1 tablet by mouth daily.     fluticasone (FLONASE) 50 MCG/ACT nasal spray Place into both nostrils daily.     Grape Seed 100 MG CAPS Take by mouth.     hydrochlorothiazide (MICROZIDE) 12.5 MG capsule Take 1 capsule (12.5 mg total) by mouth as needed. 90 capsule 1   LORazepam (ATIVAN) 1 MG tablet Take 1 tablet (1 mg total) by mouth at bedtime as needed. 90 tablet 0   losartan (COZAAR) 100 MG tablet Take 1 tablet by mouth once daily 90 tablet 3   Magnesium 500 MG TABS Take 500 mg by mouth.     meloxicam (MOBIC) 7.5 MG tablet Take 1 tablet (7.5 mg total) by mouth daily. 30 tablet 0   metoprolol succinate (TOPROL-XL) 50 MG 24 hr tablet TAKE 1.5 TABLET BY MOUTH ONCE DAILY  WITH OR IMMEDIATELY FOLLOWING A MEAL 135 tablet 2   Misc Natural Products (BLACK CHERRY CONCENTRATE PO) Take by mouth.     Multiple Vitamins-Minerals (PRESERVISION AREDS 2 PO) Take by mouth.     omega-3 acid ethyl esters (LOVAZA) 1 g capsule Take 2 capsules (2 g total) by mouth 2 (two) times daily. 360 capsule 3   phenazopyridine (PYRIDIUM) 100 MG tablet Take 1 tablet (100 mg total) by mouth 2 (two) times daily as needed for pain (urinray pain). 20 tablet 0   Polyethyl Glycol-Propyl Glycol (SYSTANE OP) Apply to  eye.     Semaglutide, 2 MG/DOSE, 8 MG/3ML SOPN Inject 2 mg as directed once a week. 3 mL 11   tiZANidine (ZANAFLEX) 4 MG tablet TAKE 1 TABLET BY MOUTH EVERY 8 HOURS AS NEEDED FOR MUSCLE SPASM 90 tablet 0   TURMERIC PO Take 1,000 mg by mouth.     UNABLE TO FIND Med Name: TENS UNIT DX: M54.2; M54.9 1 Units 0   Vitamin D, Ergocalciferol, (DRISDOL) 1.25 MG (50000 UNIT) CAPS capsule Take 1 capsule (50,000 Units total) by mouth every 7 (seven) days. 12 capsule 0   No current facility-administered medications on file prior to visit.   Allergies  Allergen Reactions   Lyrica [Pregabalin]     nightmares   Social History   Socioeconomic History   Marital status: Married    Spouse name: Not on file   Number of children: Not on file   Years of education: Not on file   Highest education level: Not on file  Occupational History   Not on file  Tobacco Use   Smoking status: Former    Types: Cigarettes    Quit date: 01/23/1988    Years since quitting: 34.8   Smokeless tobacco: Never  Vaping Use   Vaping Use: Never used  Substance and Sexual Activity   Alcohol use: No   Drug use: No   Sexual activity: Not on file  Other Topics Concern   Not on file  Social History Narrative   Not on file   Social Determinants of Health   Financial Resource Strain: Low Risk  (04/01/2020)   Overall Financial Resource Strain (CARDIA)    Difficulty of Paying Living Expenses: Not very hard  Food Insecurity: Not on file  Transportation Needs: Not on file  Physical Activity: Not on file  Stress: Not on file  Social Connections: Not on file  Intimate Partner Violence: Not on file   No family history on file.    Review of Systems  All other systems reviewed and are negative.      Objective:   Physical Exam Vitals reviewed.  Constitutional:      General: She is not in acute distress.    Appearance: She is well-developed. She is not diaphoretic.  HENT:     Head: Normocephalic and atraumatic.      Right Ear: External ear normal.     Left Ear: External ear normal.     Nose: Nose normal.     Mouth/Throat:     Pharynx: No oropharyngeal exudate.  Eyes:     General: No scleral icterus.    Conjunctiva/sclera: Conjunctivae normal.     Pupils: Pupils are equal, round, and reactive to light.  Neck:     Thyroid: No thyromegaly.     Vascular: No JVD.     Trachea: No tracheal deviation.  Cardiovascular:     Rate and Rhythm: Normal rate and regular rhythm.  Heart sounds: Normal heart sounds. No murmur heard.    No friction rub. No gallop.  Pulmonary:     Effort: Pulmonary effort is normal. No respiratory distress.     Breath sounds: Normal breath sounds. No stridor. No wheezing or rales.  Chest:     Chest wall: No tenderness.  Abdominal:     General: Bowel sounds are normal. There is no distension.     Palpations: Abdomen is soft. There is no mass.     Tenderness: There is no abdominal tenderness. There is no guarding or rebound.  Musculoskeletal:        General: No tenderness. Normal range of motion.     Cervical back: Normal range of motion and neck supple.  Lymphadenopathy:     Cervical: No cervical adenopathy.  Skin:    General: Skin is warm.     Coloration: Skin is not pale.     Findings: No erythema or rash.  Neurological:     Mental Status: She is alert and oriented to person, place, and time.     Cranial Nerves: No cranial nerve deficit.     Motor: No abnormal muscle tone.     Coordination: Coordination normal.     Deep Tendon Reflexes: Reflexes are normal and symmetric.  Psychiatric:        Mood and Affect: Mood is anxious. Affect is labile.        Behavior: Behavior normal.        Thought Content: Thought content normal.        Judgment: Judgment normal.    Abstract on 11/12/2022  Component Date Value Ref Range Status   HM Diabetic Eye Exam 09/17/2022 No Retinopathy  No Retinopathy Final  Lab on 11/07/2022  Component Date Value Ref Range Status   Vitamin  B-12 11/07/2022 459  200 - 1,100 pg/mL Final   Creatinine, Urine 11/07/2022 118  20 - 275 mg/dL Final   Microalb, Ur 11/07/2022 4.1  mg/dL Final   Comment: Reference Range Not established    Microalb Creat Ratio 11/07/2022 35 (H)  <30 mg/g creat Final   Comment: . The ADA defines abnormalities in albumin excretion as follows: Marland Kitchen Albuminuria Category        Result (mg/g creatinine) . Normal to Mildly increased   <30 Moderately increased         30-299  Severely increased           > OR = 300 . The ADA recommends that at least two of three specimens collected within a 3-6 month period be abnormal before considering a patient to be within a diagnostic category.    Hgb A1c MFr Bld 11/07/2022 5.3  <5.7 % of total Hgb Final   Comment: For the purpose of screening for the presence of diabetes: . <5.7%       Consistent with the absence of diabetes 5.7-6.4%    Consistent with increased risk for diabetes             (prediabetes) > or =6.5%  Consistent with diabetes . This assay result is consistent with a decreased risk of diabetes. . Currently, no consensus exists regarding use of hemoglobin A1c for diagnosis of diabetes in children. . According to American Diabetes Association (ADA) guidelines, hemoglobin A1c <7.0% represents optimal control in non-pregnant diabetic patients. Different metrics may apply to specific patient populations.  Standards of Medical Care in Diabetes(ADA). .    Mean Plasma Glucose 11/07/2022 105  mg/dL Final  eAG (mmol/L) 11/07/2022 5.8  mmol/L Final   Comment: . This test was performed on the Roche cobas c503 platform. Effective 05/28/22, a change in test platforms from the Abbott Architect to the Roche cobas c503 may have shifted HbA1c results compared to historical results. Based on laboratory validation testing conducted at [place] relative to the Abbott platform had an average increase in HbA1c value of < or = 0.3%. This  difference is within accepted  variability established by the [facility]. Note that not all individuals will have had a shift in their results and direct comparisons between historical and current results for testing conducted on different platforms is not recommended.    Cholesterol 11/07/2022 159  <200 mg/dL Final   HDL 11/07/2022 35 (L)  > OR = 50 mg/dL Final   Triglycerides 11/07/2022 317 (H)  <150 mg/dL Final   Comment: . If a non-fasting specimen was collected, consider repeat triglyceride testing on a fasting specimen if clinically indicated.  Yates Decamp et al. J. of Clin. Lipidol. [id number]. Marland Kitchen    LDL Cholesterol (Calc) 11/07/2022 86  mg/dL (calc) Final   Comment: Reference range: <100 . Desirable range <100 mg/dL for primary prevention;   <70 mg/dL for patients with CHD or diabetic patients  with > or = 2 CHD risk factors. Marland Kitchen LDL-C is now calculated using the Martin-Hopkins  calculation, which is a validated novel method providing  better accuracy than the Friedewald equation in the  estimation of LDL-C.  Cresenciano Genre et al. Annamaria Helling. [id number]): 2061-2068  (http://education.[facility].com/faq/FAQ164)    Total CHOL/HDL Ratio 11/07/2022 4.5  <5.0 (calc) Final   Non-HDL Cholesterol (Calc) 11/07/2022 124  <130 mg/dL (calc) Final   Comment: For patients with diabetes plus 1 major ASCVD risk  factor, treating to a non-HDL-C goal of <100 mg/dL  (LDL-C of <70 mg/dL) is considered a therapeutic  option.    WBC 11/07/2022 6.8  3.8 - 10.8 Thousand/uL Final   RBC 11/07/2022 5.30 (H)  3.80 - 5.10 Million/uL Final   Hemoglobin 11/07/2022 15.1  11.7 - 15.5 g/dL Final   HCT 11/07/2022 45.3 (H)  35.0 - 45.0 % Final   MCV 11/07/2022 85.5  80.0 - 100.0 fL Final   MCH 11/07/2022 28.5  27.0 - 33.0 pg Final   MCHC 11/07/2022 33.3  32.0 - 36.0 g/dL Final   RDW 11/07/2022 13.0  11.0 - 15.0 % Final   Platelets 11/07/2022 282  140 - 400  Thousand/uL Final   MPV 11/07/2022 10.3  7.5 - 12.5 fL Final   Neutro Abs 11/07/2022 3,924  1,500 - 7,800 cells/uL Final   Lymphs Abs 11/07/2022 2,162  850 - 3,900 cells/uL Final   Absolute Monocytes 11/07/2022 476  200 - 950 cells/uL Final   Eosinophils Absolute 11/07/2022 170  15 - 500 cells/uL Final   Basophils Absolute 11/07/2022 68  0 - 200 cells/uL Final   Neutrophils Relative % 11/07/2022 57.7  % Final   Total Lymphocyte 11/07/2022 31.8  % Final   Monocytes Relative 11/07/2022 7.0  % Final   Eosinophils Relative 11/07/2022 2.5  % Final   Basophils Relative 11/07/2022 1.0  % Final   Glucose, Bld 11/07/2022 97  65 - 99 mg/dL Final   Comment: .            Fasting reference interval .    BUN 11/07/2022 14  7 - 25 mg/dL Final   Creat 11/07/2022 0.61  0.60 - 1.00 mg/dL Final  eGFR 11/07/2022 96  > OR = 60 mL/min/1.18m2 Final   BUN/Creatinine Ratio 11/07/2022 SEE NOTE:  6 - 22 (calc) Final   Comment:    Not Reported: BUN and Creatinine are within    reference range. .    Sodium 11/07/2022 141  135 - 146 mmol/L Final   Potassium 11/07/2022 4.3  3.5 - 5.3 mmol/L Final   Chloride 11/07/2022 107  98 - 110 mmol/L Final   CO2 11/07/2022 21  20 - 32 mmol/L Final   Calcium 11/07/2022 9.4  8.6 - 10.4 mg/dL Final   Total Protein 11/07/2022 7.3  6.1 - 8.1 g/dL Final   Albumin 11/07/2022 4.2  3.6 - 5.1 g/dL Final   Globulin 11/07/2022 3.1  1.9 - 3.7 g/dL (calc) Final   AG Ratio 11/07/2022 1.4  1.0 - 2.5 (calc) Final   Total Bilirubin 11/07/2022 0.5  0.2 - 1.2 mg/dL Final   Alkaline phosphatase (APISO) 11/07/2022 72  37 - 153 U/L Final   AST 11/07/2022 15  10 - 35 U/L Final   ALT 11/07/2022 16  6 - 29 U/L Final           Assessment & Plan:  Encounter for screening mammogram for malignant neoplasm of breast - Plan: MM 3D SCREENING MAMMOGRAM BILATERAL BREAST  Colon cancer screening - Plan: Cologuard  Diabetes mellitus without complication ([place])  General medical exam  Metabolic  syndrome  Degenerative cervical disc  Dyslipidemia I am very happy with her blood pressure.  I am very happy with her hemoglobin A1c.  The Ozempic seems to be working well for the patient and she is losing weight.  I congratulated her on this.  I will schedule the patient for mammogram.  She will consent for Cologuard.  She declines a bone density test but she agrees to take calcium 1200 mg a day and vitamin D 1000 units a day.  I recommended RSV vaccine.  I recommended a COVID booster.  We will also start Lipitor 10 mg a day to try to drop her LDL cholesterol less than 55.  Recheck lab work in 6 months

## 2022-11-14 DIAGNOSIS — Z710 Person encountering health services to consult on behalf of another person: Secondary | ICD-10-CM | POA: Insufficient documentation

## 2022-11-14 DIAGNOSIS — Z1212 Encounter for screening for malignant neoplasm of rectum: Secondary | ICD-10-CM | POA: Insufficient documentation

## 2022-11-26 DIAGNOSIS — Z1211 Encounter for screening for malignant neoplasm of colon: Secondary | ICD-10-CM | POA: Diagnosis not present

## 2022-11-27 ENCOUNTER — Ambulatory Visit: Payer: Medicare Other | Admitting: Cardiovascular Disease

## 2022-12-03 LAB — COLOGUARD: COLOGUARD: POSITIVE — AB

## 2022-12-04 ENCOUNTER — Other Ambulatory Visit: Payer: Self-pay | Admitting: Family Medicine

## 2022-12-04 DIAGNOSIS — R195 Other fecal abnormalities: Secondary | ICD-10-CM

## 2022-12-05 ENCOUNTER — Encounter (INDEPENDENT_AMBULATORY_CARE_PROVIDER_SITE_OTHER): Payer: Self-pay | Admitting: *Deleted

## 2022-12-18 ENCOUNTER — Ambulatory Visit: Payer: Medicare Other | Attending: Cardiovascular Disease | Admitting: Cardiovascular Disease

## 2022-12-18 ENCOUNTER — Encounter: Payer: Self-pay | Admitting: Cardiovascular Disease

## 2022-12-18 VITALS — BP 128/72 | HR 76 | Ht 67.0 in | Wt 239.0 lb

## 2022-12-18 DIAGNOSIS — I2584 Coronary atherosclerosis due to calcified coronary lesion: Secondary | ICD-10-CM | POA: Insufficient documentation

## 2022-12-18 DIAGNOSIS — I5189 Other ill-defined heart diseases: Secondary | ICD-10-CM | POA: Insufficient documentation

## 2022-12-18 DIAGNOSIS — E782 Mixed hyperlipidemia: Secondary | ICD-10-CM | POA: Insufficient documentation

## 2022-12-18 DIAGNOSIS — I10 Essential (primary) hypertension: Secondary | ICD-10-CM | POA: Diagnosis not present

## 2022-12-18 DIAGNOSIS — G4733 Obstructive sleep apnea (adult) (pediatric): Secondary | ICD-10-CM | POA: Diagnosis not present

## 2022-12-18 DIAGNOSIS — I2510 Atherosclerotic heart disease of native coronary artery without angina pectoris: Secondary | ICD-10-CM | POA: Diagnosis not present

## 2022-12-18 DIAGNOSIS — E669 Obesity, unspecified: Secondary | ICD-10-CM | POA: Insufficient documentation

## 2022-12-18 NOTE — Patient Instructions (Signed)
Medication Instructions:  The current medical regimen is effective;  continue present plan and medications. *If you need a refill on your cardiac medications before your next appointment, please call your pharmacy*   Lab Work: CMET, LIPID in October   If you have labs (blood work) drawn today and your tests are completely normal, you will receive your results only by: MyChart Message (if you have MyChart) OR A paper copy in the mail If you have any lab test that is abnormal or we need to change your treatment, we will call you to review the results.    Follow-Up: At [facility], you and your health needs are our priority.  As part of our continuing mission to provide you with exceptional heart care, we have created designated Provider Care Teams.  These Care Teams include your primary Cardiologist (physician) and Advanced Practice Providers (APPs -  Physician Assistants and Nurse Practitioners) who all work together to provide you with the care you need, when you need it.  We recommend signing up for the patient portal called "MyChart".  Sign up information is provided on this After Visit Summary.  MyChart is used to connect with patients for Virtual Visits (Telemedicine).  Patients are able to view lab/test results, encounter notes, upcoming appointments, etc.  Non-urgent messages can be sent to your provider as well.   To learn more about what you can do with MyChart, go to [email or web address].    Your next appointment:   12 month(s)  Provider:   Nicki Guadalaj[place], MD

## 2022-12-18 NOTE — Progress Notes (Unsigned)
Cardiology Office Note    Date:  12/20/2022   ID:  Kelli Moss, DOB 1950/08/26, MRN [id number]  PCP:  Kelli Brooks, MD  Cardiologist:  Kelli Guadalajara, MD    6 month F/U cardiology/sleep evaluation initially referred through the courtesy of Dr. Lynnea Moss for evaluation of chest pain.   History of Present Illness:  Kelli Moss is a 72 y.o. female who was initially referred to me through the courtesy of Dr. Tanya Moss for evaluation of grabbing chest discomfort.  She is the sister of my patient Mr. Kelli Moss.  I saw her for my initial evaluation in December 2019 and last saw her on December 05, 2020.  Kelli Moss has a longstanding history of hypertension for > 20 years and is also felt to have metabolic syndrome.  She has a history of hiatal hernia and celiac disease followed by Dr. Loreta Moss as well as a history of pernicious anemia and peripheral neuropathy.  She also has a longstanding history of obstructive sleep apnea and has been on CPAP therapy for over 20 years.  She had been evaluated by Dr. Tanya Moss and during her evaluation she admitted to experiencing episodes of a grabbing-like chest discomfort which is nonexertional but may radiate to her jaws.  She admits to having some anxiety.  She also has had issues with lower extremity edema.  She is extremely sedentary.  She has additional risk factors including a brother with a history of CAD.  She has a remote history of tobacco.  When I initially saw her on July 31, 2018 she had significant tense lower extremity edema to her thighs and was on amlodipine 10 mg and losartan 100 mg for hypertension.  I recommended she decrease amlodipine to 5 mg and with a resting pulse of 95 added beta-blocker therapy with Toprol-XL 25 mg for 2 weeks with titration up to 50 mg.  I also added HCTZ 12.5 mg to help with her edema.  I recommended she undergo a 2D echo Doppler study as well as a coronary CTA to assess for subclinical atherosclerosis.  She  had a significant history of mixed hyperlipidemia and laboratory in October 2019 showed triglycerides of 350, and VLDL of 31.  I recommended the addition of Vascepa 2 capsules twice a day I discussed the importance of weight loss.  She was continuing to use CPAP and is followed by [facility] neurology.  She was evaluated by Kelli Course, [name].  Her coronary calcium score was 23 which placed her in the 62nd percentile for age and sex matched control.  She was found to have a 6 mm right middle lobe nodule and on repeat evaluation in July 2020 this was unchanged.  There was diffuse dilation of her esophagus suggestive of esophagitis and dysmotility disorder.  She tells me that she is in need to have another CT since another lung nodule was found.  I saw her on December 05, 2020 at which time she was not having any anginal symptomatology but admitted to occasional "chest cramping " which seem to be precipitated when she would become upset.  She has a history of morbid obesity and at that time was wondering about initiating a keto diet.  I discussed that this was not optimal for heart health and recommended the Mediterranean diet.   During her evaluation, she admitted to 100% compliance with CPAP use.  I saw her on Dec 19, 2021. Since her prior evaluation she  had issues  with blood pressure elevation.  Currently she has been taking amlodipine 10 mg daily, hydrochlorothiazide 12.5 mg but she states she rarely takes this and has only been taking at most 5 times per month.  She has been on losartan 100 mg daily and metoprolol 50 mg daily.  When she recently saw Dr. Tanya Moss on March 31 for blood pressure was elevated in the 160 systolic range.  She also admits to periods where her heart rate increases.  She wishes that I assume her sleep medicine care.  I was able to obtain a sleep study which was interpreted by Kelli Moss at [name] neurology from December 2017.  This revealed moderate to severe obstructive sleep  apnea with an overall AHI of 17 events per hour, RDI 18.3/h NREM AHI was severe at 40/h versus non-REM AHI of 16.7/h.  She brought her machine to the office today.  We were able to obtain a download through her card since her machine set up date was September 15, 2016 with [name] now as her DME company.  A download from November 20, 2021 through Dec 19, 2021 shows 100% usage and average use of 8 hours and 18 minutes.  Her ResMed AirSense 10 AutoSet unit is set at a pressure of 15 cm and AHI 0.5.  She has been using a full facemask, AirTouch F20.  I reviewed her most recent evaluation with Dr. Tanya Moss.  She is not on any therapy for lipid-lowering treatment and apparently although there was suggestion to initiate potential statin this was never done and there is concern of the patient concerning myalgias.  Laboratory from his evaluation in March 2023 shows the LDL of 84 and triglycerides were significantly elevated at 316 despite taking Lovaza 1 capsule twice daily.  Total cholesterol was 155 and HDL DL was low at 33.  Hemoglobin A1c was 6.3 consistent with prediabetes.  There was some discussion of initiating Kelli Moss to assist with appetite suppression but this has not been done.  I saw her for a yearly cardiology evaluation and to establish sleep medicine care with me.  During that evaluation, she requested to obtain a new CPAP machine since her machine was over 99 years old and she qualified.  [name] was her DME company.  I provided her with 2 sample masks including an AirTouch 20 fullface mask as well as a ResMed air fit F30i mask.  I last saw her on July 03, 2022.  She admits to a 30 pound weight loss since initiating therapy with Kelli Moss.  She was told by her DME that she now qualifies for a new CPAP machine.  She has had some issues with eye infection in June and more recently in October 2023.  She was concerned that this may be resulting from her CPAP.  She has seen Dr. Alden Moss most recently and is on  tobramycin.  She has been on a hypertensive regimen of amlodipine 10 mg, losartan 100 mg, metoprolol succinate 75 mg daily and she had self discontinued HCTZ which had been 25 mg.  She also has not been taking her omega-3 fatty acid which was Lovaza 2 capsules twice a day but has continued to take Zetia 10 mg.  She is on Kelli Moss now which has been beneficial for weight loss.  Laboratory March 2023 showed total cholesterol 155 HDL 33 LDL 84 but triglycerides remain elevated at 316.  She has continued to use her CPAP which is set at a 15 cm water pressure and AHI is  excellent at 1.6.  There is some mask leak.  She had COVID 2 months ago and is back to normal.  During that evaluation, I recommended that she resume taking Lovaza 2 capsules twice a day.  She also qualified to obtain a new CPAP machine and I placed an order for her to receive a ResMed AirSense 11 CPAP AutoSet unit.  I discussed optimal sleep duration of at least 7 to possibly 9 hours if at all possible.  Since I last saw her, she has felt well.  She received a new ResMed AirSense 11 CPAP unit on September 11, 2022 with [employer] as her DME company.  A download was obtained from January 23 through October 10, 2022 which shows excellent compliance.  She was using therapy 10 hours and 10 minutes per night.  At a 15 cm set pressure, AHI was 1.2.  A subsequent download from April 1 through December 18, 2022 continue to show compliance which average sleep duration at 7 hours and 33 minutes.  AHI was 2.9/h. She has continued to be on amlodipine 10 mg, losartan 100 mg, metoprolol tartrate 75 mg for hypertension.  She is on Lovaza 2 capsules twice a day, atorvastatin 10 mg, and Zetia 10 mg for mixed hyperlipidemia.  She takes HCTZ as needed for swelling.  She follows with Dr. Lynnea Moss for primary care and last saw him on November 13, 2022.  She presents for evaluation.  Past Medical History:  Diagnosis Date   Colon polyp    CTS (carpal tunnel  syndrome)    Depression    Diabetes mellitus without complication (HCC)    prediabetes   Dyslipidemia    Gastritis    Hypertension    Neuromuscular disorder (HCC)    peripheral neuropathy    No past surgical history on file.  Current Medications: Outpatient Medications Prior to Visit  Medication Sig Dispense Refill   amLODipine (NORVASC) 10 MG tablet Take 1 tablet by mouth once daily 90 tablet 3   ammonium lactate (AMLACTIN) 12 % cream Apply topically as needed for dry skin.     aspirin 81 MG tablet Take 81 mg by mouth daily.     atorvastatin (LIPITOR) 10 MG tablet Take 1 tablet (10 mg total) by mouth daily. 90 tablet 3   CINNAMON PO Take 2,000 mg by mouth daily.     cyanocobalamin (VITAMIN B12) 1000 MCG/ML injection INJECT 1 ML IN THE MUSCLE ONCE MONTHLY 30 mL 3   diazepam (VALIUM) 10 MG tablet TAKE 1 TABLET BY MOUTH EVERY 8 HOURS AS NEEDED FOR MUSCLE SPASM 60 tablet 0   Efinaconazole 10 % SOLN As directed for toenail fungus 8 mL 3   ezetimibe (ZETIA) 10 MG tablet Take 1 tablet (10 mg total) by mouth daily. 90 tablet 3   Flaxseed, Linseed, (FLAX SEEDS PO) Take 1 tablet by mouth daily.     fluticasone (FLONASE) 50 MCG/ACT nasal spray Place into both nostrils daily.     Grape Seed 100 MG CAPS Take by mouth.     hydrochlorothiazide (MICROZIDE) 12.5 MG capsule Take 1 capsule (12.5 mg total) by mouth as needed. 90 capsule 1   LORazepam (ATIVAN) 1 MG tablet Take 1 tablet (1 mg total) by mouth at bedtime as needed. 90 tablet 0   losartan (COZAAR) 100 MG tablet Take 1 tablet by mouth once daily 90 tablet 3   Magnesium 500 MG TABS Take 500 mg by mouth.     meloxicam (MOBIC)  7.5 MG tablet Take 1 tablet (7.5 mg total) by mouth daily. 30 tablet 0   metoprolol succinate (TOPROL-XL) 50 MG 24 hr tablet TAKE 1.5 TABLET BY MOUTH ONCE DAILY WITH OR IMMEDIATELY FOLLOWING A MEAL 135 tablet 2   Misc Natural Products (BLACK CHERRY CONCENTRATE PO) Take by mouth.     Multiple Vitamins-Minerals  (PRESERVISION AREDS 2 PO) Take by mouth.     omega-3 acid ethyl esters (LOVAZA) 1 g capsule Take 2 capsules (2 g total) by mouth 2 (two) times daily. 360 capsule 3   phenazopyridine (PYRIDIUM) 100 MG tablet Take 1 tablet (100 mg total) by mouth 2 (two) times daily as needed for pain (urinray pain). 20 tablet 0   Polyethyl Glycol-Propyl Glycol (SYSTANE OP) Apply to eye.     Semaglutide, 2 MG/DOSE, 8 MG/3ML SOPN Inject 2 mg as directed once a week. 3 mL 11   tiZANidine (ZANAFLEX) 4 MG tablet TAKE 1 TABLET BY MOUTH EVERY 8 HOURS AS NEEDED FOR MUSCLE SPASM 90 tablet 0   TURMERIC PO Take 1,000 mg by mouth.     UNABLE TO FIND Med Name: TENS UNIT DX: M54.2; M54.9 1 Units 0   Vitamin D, Ergocalciferol, (DRISDOL) 1.25 MG (50000 UNIT) CAPS capsule Take 1 capsule (50,000 Units total) by mouth every 7 (seven) days. 12 capsule 0   No facility-administered medications prior to visit.     Allergies:   Lyrica [pregabalin]   Social History   Socioeconomic History   Marital status: Married    Spouse name: Not on file   Number of children: Not on file   Years of education: Not on file   Highest education level: Not on file  Occupational History   Not on file  Tobacco Use   Smoking status: Former    Types: Cigarettes    Quit date: 01/23/1988    Years since quitting: 34.9   Smokeless tobacco: Never  Vaping Use   Vaping Use: Never used  Substance and Sexual Activity   Alcohol use: No   Drug use: No   Sexual activity: Not on file  Other Topics Concern   Not on file  Social History Narrative   Not on file   Social Determinants of Health   Financial Resource Strain: Low Risk  (04/01/2020)   Overall Financial Resource Strain (CARDIA)    Difficulty of Paying Living Expenses: Not very hard  Food Insecurity: Not on file  Transportation Needs: Not on file  Physical Activity: Not on file  Stress: Not on file  Social Connections: Not on file     Family History:  The patient's family history is not  on file.  Her mother died at age 42 with pancreatic cancer.  Father died at age 68 with bladder cancer.  She has a 53 year old brother who has hypertension and CAD who is my patient Kelli Moss.)  ROS General: Negative; No fevers, chills, or night sweats; obesity HEENT: Negative; No changes in vision or hearing, sinus congestion, difficulty swallowing Pulmonary: Negative; No cough, wheezing, shortness of breath, hemoptysis Cardiovascular: See HPI GI: Negative; No nausea, vomiting, diarrhea, or abdominal pain GU: Negative; No dysuria, hematuria, or difficulty voiding Musculoskeletal: Negative; no myalgias, joint pain, or weakness Hematologic/Oncology: Negative; no easy bruising, bleeding Endocrine: metabolic syndrome Neuro: Negative; no changes in balance, headaches Skin: Negative; No rashes or skin lesions Psychiatric: Negative; No behavioral problems, depression Sleep: OSA on CPAP for >20 years; most recent sleep study 2017 showed moderately severe sleep apnea with  severe sleep apnea during REM sleep.  She also had occasional periodic limb movement of sleep.  She is compliant with therapy.  Other comprehensive 14 point system review is negative.   PHYSICAL EXAM:   VS:  BP 128/72 (BP Location: Left Arm, Patient Position: Sitting)   Pulse 76   Ht 5\' 7"  (1.702 m)   Wt 239 lb (108.4 kg)   SpO2 96%   BMI 37.43 kg/m     Repeat blood pressure by me was 126/68  Wt Readings from Last 3 Encounters:  12/18/22 239 lb (108.4 kg)  11/13/22 219 lb 12.8 oz (99.7 kg)  07/03/22 247 lb (112 kg)    General: Alert, oriented, no distress.  Skin: normal turgor, no rashes, warm and dry HEENT: Normocephalic, atraumatic. Pupils equal round and reactive to light; sclera anicteric; extraocular muscles intact;  Nose without nasal septal hypertrophy Mouth/Parynx benign; Mallinpatti scale 3 Neck: No JVD, no carotid bruits; normal carotid upstroke Lungs: clear to ausculatation and percussion; no  wheezing or rales Chest wall: without tenderness to palpitation Heart: PMI not displaced, RRR, s1 s2 normal, 1/6 systolic murmur, no diastolic murmur, no rubs, gallops, thrills, or heaves Abdomen: soft, nontender; no hepatosplenomehaly, BS+; abdominal aorta nontender and not dilated by palpation. Back: no CVA tenderness Pulses 2+ Musculoskeletal: full range of motion, normal strength, no joint deformities Extremities: no clubbing cyanosis or edema, Homan's sign negative  Neurologic: grossly nonfocal; Cranial nerves grossly wnl Psychologic: Normal mood and affect     Studies/Labs Reviewed:   December 18, 2022 ECG (independently read by me): NSR at 76, RBBB  July 03, 2022 ECG (independently read by me): NSR at 79, RBBB  Dec 19, 2021 ECG (independently read by me): NSR at 79, nonspecific intraventricular block  April 18,2022 ECG (independently read by me): NSR at 81, RBBB, QTc 497, no ectopy  December 2019 ECG (independently read by me): Normal sinus rhythm at 95 bpm.  Right bundle branch block with repolarization changes.  No ectopy.  Recent Labs:    Latest Ref Rng & Units 11/07/2022    8:03 AM 03/26/2022    8:22 AM 11/03/2021    8:46 AM  BMP  Glucose 65 - 99 mg/dL 97  161  096   BUN 7 - 25 mg/dL 14  11  13    Creatinine 0.60 - 1.00 mg/dL 0.45  4.09  8.11   BUN/Creat Ratio 6 - 22 (calc) SEE NOTE:  SEE NOTE:  23   Sodium 135 - 146 mmol/L 141  138  140   Potassium 3.5 - 5.3 mmol/L 4.3  3.9  3.6   Chloride 98 - 110 mmol/L 107  104  104   CO2 20 - 32 mmol/L 21  24  25    Calcium 8.6 - 10.4 mg/dL 9.4  9.3  9.1         Latest Ref Rng & Units 11/07/2022    8:03 AM 03/26/2022    8:22 AM 11/03/2021    8:46 AM  Hepatic Function  Total Protein 6.1 - 8.1 g/dL 7.3  7.1  7.0   AST 10 - 35 U/L 15  13  17    ALT 6 - 29 U/L 16  13  18    Total Bilirubin 0.2 - 1.2 mg/dL 0.5  0.6  0.7        Latest Ref Rng & Units 11/07/2022    8:03 AM 11/03/2021    8:46 AM 02/13/2021   10:06 AM  CBC  WBC  3.8 - 10.8 Thousand/uL 6.8  9.0  8.4   Hemoglobin 11.7 - 15.5 g/dL 96.0  45.4  09.8   Hematocrit 35.0 - 45.0 % 45.3  41.1  38.1   Platelets 140 - 400 Thousand/uL 282  263  236.0    Lab Results  Component Value Date   MCV 85.5 11/07/2022   MCV 84.0 11/03/2021   MCV 84.0 02/13/2021   Lab Results  Component Value Date   TSH 1.62 02/13/2021   Lab Results  Component Value Date   HGBA1C 5.3 11/07/2022     BNP No results found for: "BNP"  ProBNP No results found for: "PROBNP"   Lipid Panel     Component Value Date/Time   CHOL 159 11/07/2022 0803   TRIG 317 (H) 11/07/2022 0803   HDL 35 (L) 11/07/2022 0803   CHOLHDL 4.5 11/07/2022 0803   VLDL 48 (H) 12/27/2016 0809   LDLCALC 86 11/07/2022 0803     RADIOLOGY: MM 3D SCREENING MAMMOGRAM BILATERAL BREAST  Result Date: 12/20/2022 CLINICAL DATA:  Screening. EXAM: DIGITAL SCREENING BILATERAL MAMMOGRAM WITH TOMOSYNTHESIS AND CAD TECHNIQUE: Bilateral screening digital craniocaudal and mediolateral oblique mammograms were obtained. Bilateral screening digital breast tomosynthesis was performed. The images were evaluated with computer-aided detection. COMPARISON:  Previous exam(s). ACR Breast Density Category b: There are scattered areas of fibroglandular density. FINDINGS: There are no findings suspicious for malignancy. IMPRESSION: No mammographic evidence of malignancy. A result letter of this screening mammogram will be mailed directly to the patient. RECOMMENDATION: Screening mammogram in one year. (Code:SM-B-01Y) BI-RADS CATEGORY  1: Negative. Electronically Signed   By: Elberta Fortis M.D.   On: 12/20/2022 12:52     Additional studies/ records that were reviewed today include:  I reviewed the records of Dr. Tanya Moss as well as her recent laboratory.  I have I obtain the results of her most recent sleep study interpreted by Dr. Frances Furbish at [facility] neurology from August 06, 2016.  She brought her machine with her today.  A download  was obtained.  Her machine was linked to our office for establishment of follow-up   ASSESSMENT:    1. OSA (obstructive sleep apnea)   2. Essential hypertension   3. Mixed hyperlipidemia   4. Obesity, Class II, BMI 35-39.9, isolated   5. Coronary artery calcification   6. Grade I diastolic dysfunction     PLAN:  Kelli Moss is a 72 year old female patient who is followed by Dr. Tanya Moss and has a longstanding history of hypertension for greater than 20 years as well as obstructive sleep apnea for which she has been on CPAP therapy. She has a longstanding history of morbid obesity and has metabolic syndrome/prediabetes..  She was diagnosed with a hiatal hernia and also has a history of pernicious anemia as well as peripheral neuropathy.  When I initially saw her she had tense lower extremity edema did improve with medication adjustment and diuretic therapy.  An echo Doppler study in December 2019 showed hyperdynamic LV function with EF 65 to 70%, grade 1 diastolic dysfunction, mild left atrial enlargement, and moderate LVH.  She had a coronary calcium score of 23.  Since her initial evaluation in 2019 I had not seen her in over 2-1/2 years until her last evaluation in April 2022.  At that time, she was back on amlodipine at 10 mg, and was on metoprolol succinate 50 mg daily, losartan 100 mg, and HCTZ 12.5 mg.  Over the years she has had medication adjustment for  blood pressure control.  Her blood pressure today is excellent now on her current regimen of amlodipine 10 mg, losartan 100 mg, metoprolol succinate 75 mg and she takes HCTZ 12.5 mg as needed.  Currently, there is no edema.  She has been successful with weight loss.  She has been on Kelli Moss.  She has mixed hyperlipidemia and is on a regimen of atorvastatin 10 mg, Zetia 10 mg, in addition to Lovaza 2 capsules twice a day.  I reviewed recent laboratory from November 07, 2022 done by Dr. Tanya Moss.  Renal function was stable with creatinine 0.61.   Glucose was 97.  LFTs were normal.  She does have mild microalbuminuria.  Hemoglobin A1c was excellent at 5.3.  Lipid studies continue to be suboptimal with continued triglyceride elevation at 317.  HDL was low at 35, total cholesterol 159 and LDL cholesterol 86.  She continues to use CPAP and received a new ResMed AirSense 11 CPAP auto unit on September 11, 2022 with [employer] as her DME company.  She has been using a ResMed AirTouch F20 mask.  I provided her with a new sample mask today in the office.  Her downloads demonstrate excellent compliance and her CPAP is set at a 15 cm water pressure.  Again discussed the importance of diet and exercise with American Heart Association recommendations of at least 150 minutes of exercise per week corresponding to a at least 5 days/week of 30 minutes of moderate intensity.  She will have follow-up laboratory with Dr. Tanya Moss.  I will see her in 1 year for follow-up evaluation or sooner as needed.     Medication Adjustments/Labs and Tests Ordered: Current medicines are reviewed at length with the patient today.  Concerns regarding medicines are outlined above.  Medication changes, Labs and Tests ordered today are listed in the Patient Instructions below. Patient Instructions  Medication Instructions:  The current medical regimen is effective;  continue present plan and medications. *If you need a refill on your cardiac medications before your next appointment, please call your pharmacy*   Lab Work: CMET, LIPID in October   If you have labs (blood work) drawn today and your tests are completely normal, you will receive your results only by: MyChart Message (if you have MyChart) OR A paper copy in the mail If you have any lab test that is abnormal or we need to change your treatment, we will call you to review the results.    Follow-Up: At [facility], you and your health needs are our priority.  As part of our continuing mission to  provide you with exceptional heart care, we have created designated Provider Care Teams.  These Care Teams include your primary Cardiologist (physician) and Advanced Practice Providers (APPs -  Physician Assistants and Nurse Practitioners) who all work together to provide you with the care you need, when you need it.  We recommend signing up for the patient portal called "MyChart".  Sign up information is provided on this After Visit Summary.  MyChart is used to connect with patients for Virtual Visits (Telemedicine).  Patients are able to view lab/test results, encounter notes, upcoming appointments, etc.  Non-urgent messages can be sent to your provider as well.   To learn more about what you can do with MyChart, go to [email or web address].    Your next appointment:   12 month(s)  Provider:   Nicki Guadalajara, MD        Signed, Kelli Guadalajara, MD  12/20/2022 5:54  [facility] [address] [place], [place], [place]  [address] Phone: [phone]

## 2022-12-19 ENCOUNTER — Ambulatory Visit (HOSPITAL_COMMUNITY)
Admission: RE | Admit: 2022-12-19 | Discharge: 2022-12-19 | Disposition: A | Discharge status: Home / Self Care | Payer: Medicare Other | Source: Ambulatory Visit | Attending: Family Medicine | Admitting: Family Medicine

## 2022-12-19 DIAGNOSIS — Z1231 Encounter for screening mammogram for malignant neoplasm of breast: Secondary | ICD-10-CM | POA: Insufficient documentation

## 2022-12-20 ENCOUNTER — Encounter: Payer: Self-pay | Admitting: Cardiovascular Disease

## 2022-12-24 ENCOUNTER — Encounter (INDEPENDENT_AMBULATORY_CARE_PROVIDER_SITE_OTHER): Payer: Self-pay | Admitting: Gastroenterology

## 2022-12-24 ENCOUNTER — Ambulatory Visit (INDEPENDENT_AMBULATORY_CARE_PROVIDER_SITE_OTHER): Payer: Medicare Other | Admitting: Gastroenterology

## 2022-12-24 VITALS — BP 116/77 | HR 85 | Temp 98.1°F | Ht 67.0 in | Wt 239.0 lb

## 2022-12-24 DIAGNOSIS — K900 Celiac disease: Secondary | ICD-10-CM

## 2022-12-24 DIAGNOSIS — R195 Other fecal abnormalities: Secondary | ICD-10-CM | POA: Diagnosis not present

## 2022-12-24 DIAGNOSIS — R197 Diarrhea, unspecified: Secondary | ICD-10-CM

## 2022-12-24 MED ORDER — ONDANSETRON HCL 4 MG PO TABS: 4.0000 mg | ORAL_TABLET | Freq: Three times a day (TID) | ORAL | 0 refills | Status: AC | PRN

## 2022-12-24 NOTE — Progress Notes (Addendum)
Referring Provider: Donita Brooks, MD Primary Care Physician:  Donita Brooks, MD Primary GI Physician: new   Chief Complaint  Patient presents with   positive cologuard    Referred for positive cologuard. Has not noticed any blood in her stool. Last colonoscopy 2015 with Dr. [name]. Has concerns about EGD with biopsy done back in 2015. Unclear about results and wanted to discuss.    HPI:   Kelli Moss is a 72 y.o. female with past medical history of depression, DM, dyslipidemia, HTN, peripheral neuropathy, PKI disease   Patient presenting today as a new patient for positive cologuard.   Patient denies changes in bowel habits. She denies black stools. Occasional rectal bleeding with blood on the toilet tissue that she has been noticing for many years. She reports stools range from constipation to watery, this has been going on for most of her life. She notes some Right sided abdominal pain, also for many years. She feels that pain is gas related. She reports history of IBS in the past. She reports she was diagnosed with celiacs disease a few years ago by Dr [name] and was told to go gluten free but she states was later told it was unclear if she has celiac disease or not. She states that she does not eat gluten free now. She does probiotics and kombucha which she feels helps her quite a bit. She notes during time of previous gluten testing on labs in 2022, she was eating gluten free. No changes in appetite or weight loss.   NSAID use: occasional  Social hx: no etoh or tobacco   Fam hx: maternal grandmother had CRC at age 42  Last Colonoscopy: 2015 diverticula in sigmoid, polyp in rectum-hyperplastic Last Endoscopy:2015 small hh, normal duodenum, mild duodenal intraepithelial lymphocytosis (questionable for celiac disease in presence of elevated TTG titer)   Recommendations:    Past Medical History:  Diagnosis Date   Colon polyp    CTS (carpal tunnel syndrome)    Depression     Diabetes mellitus without complication (HCC)    prediabetes   Dyslipidemia    Gastritis    Hypertension    Neuromuscular disorder (HCC)    peripheral neuropathy    No past surgical history on file.  Current Outpatient Medications  Medication Sig Dispense Refill   amLODipine (NORVASC) 10 MG tablet Take 1 tablet by mouth once daily 90 tablet 3   ammonium lactate (AMLACTIN) 12 % cream Apply topically as needed for dry skin.     aspirin 81 MG tablet Take 81 mg by mouth daily.     atorvastatin (LIPITOR) 10 MG tablet Take 1 tablet (10 mg total) by mouth daily. 90 tablet 3   CINNAMON PO Take 2,000 mg by mouth daily.     cyanocobalamin (VITAMIN B12) 1000 MCG/ML injection INJECT 1 ML IN THE MUSCLE ONCE MONTHLY 30 mL 3   diazepam (VALIUM) 10 MG tablet TAKE 1 TABLET BY MOUTH EVERY 8 HOURS AS NEEDED FOR MUSCLE SPASM 60 tablet 0   Efinaconazole 10 % SOLN As directed for toenail fungus 8 mL 3   ezetimibe (ZETIA) 10 MG tablet Take 1 tablet (10 mg total) by mouth daily. 90 tablet 3   Flaxseed, Linseed, (FLAX SEEDS PO) Take 1 tablet by mouth daily.     fluticasone (FLONASE) 50 MCG/ACT nasal spray Place into both nostrils daily.     Grape Seed 100 MG CAPS Take by mouth.     hydrochlorothiazide (MICROZIDE) 12.5  MG capsule Take 1 capsule (12.5 mg total) by mouth as needed. 90 capsule 1   LORazepam (ATIVAN) 1 MG tablet Take 1 tablet (1 mg total) by mouth at bedtime as needed. 90 tablet 0   losartan (COZAAR) 100 MG tablet Take 1 tablet by mouth once daily 90 tablet 3   Magnesium 500 MG TABS Take 500 mg by mouth.     meloxicam (MOBIC) 7.5 MG tablet Take 1 tablet (7.5 mg total) by mouth daily. 30 tablet 0   metoprolol succinate (TOPROL-XL) 50 MG 24 hr tablet TAKE 1.5 TABLET BY MOUTH ONCE DAILY WITH OR IMMEDIATELY FOLLOWING A MEAL 135 tablet 2   Misc Natural Products (BLACK CHERRY CONCENTRATE PO) Take by mouth.     Multiple Vitamins-Minerals (PRESERVISION AREDS 2 PO) Take by mouth.     omega-3 acid ethyl  esters (LOVAZA) 1 g capsule Take 2 capsules (2 g total) by mouth 2 (two) times daily. 360 capsule 3   Polyethyl Glycol-Propyl Glycol (SYSTANE OP) Apply to eye.     Semaglutide, 2 MG/DOSE, 8 MG/3ML SOPN Inject 2 mg as directed once a week. 3 mL 11   tiZANidine (ZANAFLEX) 4 MG tablet TAKE 1 TABLET BY MOUTH EVERY 8 HOURS AS NEEDED FOR MUSCLE SPASM 90 tablet 0   TURMERIC PO Take 1,000 mg by mouth.     UNABLE TO FIND Med Name: TENS UNIT DX: M54.2; M54.9 1 Units 0   Vitamin D, Ergocalciferol, (DRISDOL) 1.25 MG (50000 UNIT) CAPS capsule Take 1 capsule (50,000 Units total) by mouth every 7 (seven) days. 12 capsule 0   phenazopyridine (PYRIDIUM) 100 MG tablet Take 1 tablet (100 mg total) by mouth 2 (two) times daily as needed for pain (urinray pain). (Patient not taking: Reported on 12/24/2022) 20 tablet 0   No current facility-administered medications for this visit.    Allergies as of 12/24/2022 - Review Complete 12/24/2022  Allergen Reaction Noted   Lyrica [pregabalin]  01/22/2013    No family history on file.  Social History   Socioeconomic History   Marital status: Married    Spouse name: Not on file   Number of children: Not on file   Years of education: Not on file   Highest education level: Not on file  Occupational History   Not on file  Tobacco Use   Smoking status: Former    Types: Cigarettes    Quit date: 01/23/1988    Years since quitting: 34.9    Passive exposure: Past   Smokeless tobacco: Never  Vaping Use   Vaping Use: Never used  Substance and Sexual Activity   Alcohol use: No   Drug use: No   Sexual activity: Not on file  Other Topics Concern   Not on file  Social History Narrative   Not on file   Social Determinants of Health   Financial Resource Strain: Low Risk  (04/01/2020)   Overall Financial Resource Strain (CARDIA)    Difficulty of Paying Living Expenses: Not very hard  Food Insecurity: Not on file  Transportation Needs: Not on file  Physical Activity:  Not on file  Stress: Not on file  Social Connections: Not on file    Review of systems General: negative for malaise, night sweats, fever, chills, weight loss Neck: Negative for lumps, goiter, pain and significant neck swelling Resp: Negative for cough, wheezing, dyspnea at rest CV: Negative for chest pain, leg swelling, palpitations, orthopnea GI: denies melena, nausea, vomiting, constipation, dysphagia, odyonophagia, early satiety or unintentional  weight loss. +abdominal discomfort +loose stools +toilet tissue hematochezia  MSK: Negative for joint pain or swelling, back pain, and muscle pain. Derm: Negative for itching or rash Psych: Denies depression, anxiety, memory loss, confusion. No homicidal or suicidal ideation.  Heme: Negative for prolonged bleeding, bruising easily, and swollen nodes. Endocrine: Negative for cold or heat intolerance, polyuria, polydipsia and goiter. Neuro: negative for tremor, gait imbalance, syncope and seizures. The remainder of the review of systems is noncontributory.  Physical Exam: BP 116/77 (BP Location: Left Arm, Patient Position: Sitting, Cuff Size: Large)   Pulse 85   Temp 98.1 F (36.7 C) (Oral)   Ht 5\' 7"  (1.702 m)   Wt 239 lb (108.4 kg)   BMI 37.43 kg/m  General:   Alert and oriented. No distress noted. Pleasant and cooperative.  Head:  Normocephalic and atraumatic. Eyes:  Conjuctiva clear without scleral icterus. Mouth:  Oral mucosa pink and moist. Good dentition. No lesions. Heart: Normal rate and rhythm, s1 and s2 heart sounds present.  Lungs: Clear lung sounds in all lobes. Respirations equal and unlabored. Abdomen:  +BS, soft, non-tender and non-distended. No rebound or guarding. No HSM or masses noted. Derm: No palmar erythema or jaundice Msk:  Symmetrical without gross deformities. Normal posture. Extremities:  Without edema. Neurologic:  Alert and  oriented x4 Psych:  Alert and cooperative. Normal mood and affect.  Invalid  input(s): "6 MONTHS"   ASSESSMENT: Kelli Moss is a 72 y.o. female presenting today for positive cologuard, also with question of celiac's disease.  Positive cologuard: toilet tissue hematochezia ongoing for many years, last TCS 2015. Discussed that Cologuard is intended for the qualitative detection of colorectal neoplasia associated DNA markers and for the presence of occult hemoglobin in human stool. A positive result may indicate the presence of colorectal cancer (CRC) or pre cancerous polyps, and should be followed by colonoscopy. A negative Cologuard test result does not guarantee absence of cancer or pre cancerous polyp. Recommend proceeding with colonoscopy for further evaluation. She notes some issues with nausea when doing prep previously, will send short course of anti emetics to have on hand during her prep. Indications, risks and benefits of procedure discussed in detail with patient. Patient verbalized understanding and is in agreement to proceed with Colonoscopy.  Loose stools/possible celiac disease:  patient reports question of celiac disease in the past, stating she was told she did have it and then later that she did not. Duodenal biopsy in 2015 was concerning for possible celiac disease. She had labs done in 2022 for celiac disease that were negative, however, was doing a gluten free diet at that time which likely influenced the results. She has stools ranging from constipation to diarrhea, she does probiotic and kombucha which she feels helps. She is no longer following a gluten free diet. Will repeat celiac testing via lab work now that she is actively eating gluten.    PLAN:   Schedule Colonoscopy- (on semiglutide) ASA III 2. Celiac disease testing  3. Zofran Q8h for colon prep  All questions were answered, patient verbalized understanding and is in agreement with plan as outlined above.   Follow Up: TBD  Melady Chow L. Jeanmarie Hubert, MSN, APRN, AGNP-C Adult-Gerontology Nurse  Practitioner [facility]  I have reviewed the note and agree with the APP's assessment as described in this progress note  Katrinka Blazing, MD Gastroenterology and Hepatology [facility]

## 2022-12-24 NOTE — Patient Instructions (Signed)
Cologuard is intended for the qualitative detection of colorectal neoplasia associated DNA markers and for the presence of occult hemoglobin in human stool. A positive result may indicate the presence of colorectal cancer (CRC) or pre cancerous polyps, and should be followed by colonoscopy. A negative Cologuard test result does not guarantee absence of cancer or pre cancerous polyp.  We will get you scheduled for colonoscopy, I have sent some nausea medication for you to have on hand. I will order celiac panel for you to have done at your PCP office  Follow up to be determined after colonoscopy  It was a pleasure to see you today. I want to create trusting relationships with patients and provide genuine, compassionate, and quality care. I truly value your feedback! please be on the lookout for a survey regarding your visit with me today. I appreciate your input about our visit and your time in completing this!    Shamica Moree L. Jeanmarie Hubert, MSN, APRN, AGNP-C Adult-Gerontology Nurse Practitioner [facility] at [facility]

## 2022-12-28 ENCOUNTER — Telehealth (INDEPENDENT_AMBULATORY_CARE_PROVIDER_SITE_OTHER): Payer: Self-pay | Admitting: *Deleted

## 2022-12-28 NOTE — Telephone Encounter (Signed)
Pa submitted for ondansetron and fax from [facility] stating med was denied. Pt called and notified. Her pharmacy also told me she could apply good rx card and get for $13.09. pt verbalized understanding.

## 2023-01-01 ENCOUNTER — Encounter: Payer: Self-pay | Admitting: Podiatry

## 2023-01-01 ENCOUNTER — Ambulatory Visit (INDEPENDENT_AMBULATORY_CARE_PROVIDER_SITE_OTHER): Payer: Medicare Other | Admitting: Podiatry

## 2023-01-01 DIAGNOSIS — E119 Type 2 diabetes mellitus without complications: Secondary | ICD-10-CM | POA: Diagnosis not present

## 2023-01-01 DIAGNOSIS — M79674 Pain in right toe(s): Secondary | ICD-10-CM

## 2023-01-01 DIAGNOSIS — B351 Tinea unguium: Secondary | ICD-10-CM

## 2023-01-01 DIAGNOSIS — M79675 Pain in left toe(s): Secondary | ICD-10-CM | POA: Diagnosis not present

## 2023-01-01 NOTE — Progress Notes (Signed)
This patient presents  to my office for at risk foot care.  This patient requires this care by a professional since this patient will be at risk due to having diabetic neuropathy.  Her neuropathy is due to pernicious anemia. She has also been diagnosed with neuromuscular disorder.  This patient is unable to cut nails herself since the patient cannot reach her nails.These nails are painful walking and wearing shoes.  This patient presents for at risk foot care today.  General Appearance  Alert, conversant and in no acute stress.  Vascular  Dorsalis pedis and posterior tibial  pulses are weakly  palpable due to swelling bilaterally.  Capillary return is within normal limits  bilaterally. Temperature is within normal limits  bilaterally.  Neurologic  Senn-Weinstein monofilament wire test absent. bilaterally. Muscle power absent. bilaterally.  Nails Thick disfigured discolored nails with subungual debris  from hallux to fifth toes bilaterally. No evidence of bacterial infection or drainage bilaterally.  Pincer nails noted.  Orthopedic  No limitations of motion  feet .  No crepitus or effusions noted.  No bony pathology or digital deformities noted.  Skin  normotropic skin with no porokeratosis noted bilaterally.  No signs of infections or ulcers noted.     Onychomycosis  Pain in right toes  Pain in left toes  Consent was obtained for treatment procedures.   Mechanical debridement of nails 1-5  bilaterally performed with a nail nipper.  Filed with dremel without incident.    Return office visit   3 months                   Told patient to return for periodic foot care and evaluation due to potential at risk complications.   Suleima Ohlendorf DPM   

## 2023-01-02 ENCOUNTER — Encounter (INDEPENDENT_AMBULATORY_CARE_PROVIDER_SITE_OTHER): Payer: Self-pay

## 2023-01-04 ENCOUNTER — Ambulatory Visit: Payer: Medicare Other | Admitting: Podiatry

## 2023-01-07 ENCOUNTER — Ambulatory Visit: Payer: Medicare Other | Admitting: Podiatry

## 2023-01-10 ENCOUNTER — Ambulatory Visit: Payer: Medicare Other | Admitting: Podiatry

## 2023-01-11 ENCOUNTER — Other Ambulatory Visit: Payer: Self-pay | Admitting: Cardiovascular Disease

## 2023-01-21 ENCOUNTER — Ambulatory Visit (INDEPENDENT_AMBULATORY_CARE_PROVIDER_SITE_OTHER): Payer: Medicare Other | Admitting: Gastroenterology

## 2023-01-25 ENCOUNTER — Other Ambulatory Visit: Payer: Self-pay | Admitting: Cardiovascular Disease

## 2023-01-25 ENCOUNTER — Other Ambulatory Visit: Payer: Self-pay | Admitting: Family Medicine

## 2023-01-25 NOTE — Telephone Encounter (Signed)
Requested medication (s) are due for refill today: routing for review  Requested medication (s) are on the active medication list: yes  Last refill:  10/05/20  Future visit scheduled: no  Notes to clinic:  Unable to refill per protocol, cannot delegate.      Requested Prescriptions  Pending Prescriptions Disp Refills   diazepam (VALIUM) 10 MG tablet [Pharmacy Med Name: diazePAM 10 MG Oral Tablet] 60 tablet 0    Sig: TAKE 1 TABLET BY MOUTH EVERY 8 HOURS AS NEEDED FOR MUSCLE SPASM     Not Delegated - Psychiatry: Anxiolytics/Hypnotics 2 Failed - 01/25/2023 10:46 AM      Failed - This refill cannot be delegated      Failed - Urine Drug Screen completed in last 360 days      Failed - Valid encounter within last 6 months    Recent Outpatient Visits           1 year ago General medical exam   [facility] Donita Brooks, MD   1 year ago Dysuria   [facility] Valentino Nose, NP   2 years ago General medical exam   [facility] Donita Brooks, MD   3 years ago General medical exam   [facility] Donita Brooks, MD   3 years ago Trigger thumb, right thumb   [facility] Pickard, Priscille Heidelberg, MD              Passed - Patient is not pregnant

## 2023-01-28 DIAGNOSIS — H16213 Exposure keratoconjunctivitis, bilateral: Secondary | ICD-10-CM | POA: Diagnosis not present

## 2023-01-28 DIAGNOSIS — H0288B Meibomian gland dysfunction left eye, upper and lower eyelids: Secondary | ICD-10-CM | POA: Diagnosis not present

## 2023-01-28 DIAGNOSIS — H18833 Recurrent erosion of cornea, bilateral: Secondary | ICD-10-CM | POA: Diagnosis not present

## 2023-01-28 DIAGNOSIS — H0288A Meibomian gland dysfunction right eye, upper and lower eyelids: Secondary | ICD-10-CM | POA: Diagnosis not present

## 2023-01-28 DIAGNOSIS — H18593 Other hereditary corneal dystrophies, bilateral: Secondary | ICD-10-CM | POA: Diagnosis not present

## 2023-01-28 DIAGNOSIS — H04123 Dry eye syndrome of bilateral lacrimal glands: Secondary | ICD-10-CM | POA: Diagnosis not present

## 2023-02-04 ENCOUNTER — Ambulatory Visit (INDEPENDENT_AMBULATORY_CARE_PROVIDER_SITE_OTHER): Payer: Medicare Other | Admitting: Gastroenterology

## 2023-02-08 NOTE — Patient Instructions (Signed)
   Your procedure is scheduled on: 02/14/2023  Report to [facility] Main Entrance at   6:00  AM.  Call this number if you have problems the morning of surgery: [phone]   Remember:              Follow Directions on the letter you received from Your Physician's office regarding the Bowel Prep              No Smoking the day of Procedure :   Take these medicines the morning of surgery with A SIP OF WATER: Amlodipine, zetia, flonase, metoprolol, and zanaflex if needed  No diabetic medication am of procedure   Do not wear jewelry, make-up or nail polish.    Do not bring valuables to the hospital.  Contacts, dentures or bridgework may not be worn into surgery.  .   Patients discharged the day of surgery will not be allowed to drive home.     Colonoscopy, Adult, Care After This sheet gives you information about how to care for yourself after your procedure. Your health care provider may also give you more specific instructions. If you have problems or questions, contact your health care provider. What can I expect after the procedure? After the procedure, it is common to have: A small amount of blood in your stool for 24 hours after the procedure. Some gas. Mild abdominal cramping or bloating.  Follow these instructions at home: General instructions  For the first 24 hours after the procedure: Do not drive or use machinery. Do not sign important documents. Do not drink alcohol. Do your regular daily activities at a slower pace than normal. Eat soft, easy-to-digest foods. Rest often. Take over-the-counter or prescription medicines only as told by your health care provider. It is up to you to get the results of your procedure. Ask your health care provider, or the department performing the procedure, when your results will be ready. Relieving cramping and bloating Try walking around when you have cramps or feel bloated. Apply heat to your abdomen as told by your health care  provider. Use a heat source that your health care provider recommends, such as a moist heat pack or a heating pad. Place a towel between your skin and the heat source. Leave the heat on for 20-30 minutes. Remove the heat if your skin turns bright red. This is especially important if you are unable to feel pain, heat, or cold. You may have a greater risk of getting burned. Eating and drinking Drink enough fluid to keep your urine clear or pale yellow. Resume your normal diet as instructed by your health care provider. Avoid heavy or fried foods that are hard to digest. Avoid drinking alcohol for as long as instructed by your health care provider. Contact a health care provider if: You have blood in your stool 2-3 days after the procedure. Get help right away if: You have more than a small spotting of blood in your stool. You pass large blood clots in your stool. Your abdomen is swollen. You have nausea or vomiting. You have a fever. You have increasing abdominal pain that is not relieved with medicine. This information is not intended to replace advice given to you by your health care provider. Make sure you discuss any questions you have with your health care provider. Document Released: 03/20/2004 Document Revised: 04/30/2016 Document Reviewed: 10/18/2015 Elsevier Interactive Patient Education  [employer].

## 2023-02-12 ENCOUNTER — Other Ambulatory Visit (HOSPITAL_COMMUNITY)
Admission: RE | Admit: 2023-02-12 | Discharge: 2023-02-12 | Disposition: A | Discharge status: Home / Self Care | Payer: Medicare Other | Source: Ambulatory Visit | Attending: Gastroenterology | Admitting: Gastroenterology

## 2023-02-12 ENCOUNTER — Encounter (HOSPITAL_COMMUNITY)
Admission: RE | Admit: 2023-02-12 | Discharge: 2023-02-12 | Disposition: A | Discharge status: Home / Self Care | Payer: Medicare Other | Source: Ambulatory Visit | Attending: Gastroenterology | Admitting: Gastroenterology

## 2023-02-12 ENCOUNTER — Encounter (HOSPITAL_COMMUNITY): Payer: Self-pay

## 2023-02-12 VITALS — BP 125/80 | HR 81 | Temp 97.8°F | Resp 18 | Ht 67.0 in | Wt 239.0 lb

## 2023-02-12 DIAGNOSIS — G4733 Obstructive sleep apnea (adult) (pediatric): Secondary | ICD-10-CM | POA: Insufficient documentation

## 2023-02-12 DIAGNOSIS — K900 Celiac disease: Secondary | ICD-10-CM | POA: Diagnosis not present

## 2023-02-12 DIAGNOSIS — R197 Diarrhea, unspecified: Secondary | ICD-10-CM | POA: Diagnosis not present

## 2023-02-12 DIAGNOSIS — I10 Essential (primary) hypertension: Secondary | ICD-10-CM | POA: Diagnosis not present

## 2023-02-12 DIAGNOSIS — Z01812 Encounter for preprocedural laboratory examination: Secondary | ICD-10-CM | POA: Diagnosis not present

## 2023-02-12 DIAGNOSIS — Z79899 Other long term (current) drug therapy: Secondary | ICD-10-CM

## 2023-02-12 DIAGNOSIS — E782 Mixed hyperlipidemia: Secondary | ICD-10-CM | POA: Diagnosis not present

## 2023-02-12 HISTORY — DX: Other complications of anesthesia, initial encounter: T88.59XA

## 2023-02-12 LAB — LIPID PANEL
Cholesterol: 103 mg/dL (ref 0–200)
HDL: 31 mg/dL — ABNORMAL LOW (ref >40)
LDL Cholesterol: 32 mg/dL (ref 0–99)
Total CHOL/HDL Ratio: 3.3 RATIO
Triglycerides: 199 mg/dL — ABNORMAL HIGH (ref <150)
VLDL: 40 mg/dL (ref 0–40)

## 2023-02-12 LAB — COMPREHENSIVE METABOLIC PANEL
ALT: 21 U/L (ref 0–44)
AST: 23 U/L (ref 15–41)
Albumin: 4.1 g/dL (ref 3.5–5.0)
Alkaline Phosphatase: 65 U/L (ref 38–126)
Anion gap: 9 (ref 5–15)
BUN: 11 mg/dL (ref 8–23)
CO2: 25 mmol/L (ref 22–32)
Calcium: 8.8 mg/dL — ABNORMAL LOW (ref 8.9–10.3)
Chloride: 104 mmol/L (ref 98–111)
Creatinine, Ser: 0.64 mg/dL (ref 0.44–1.00)
GFR, Estimated: >60 mL/min (ref >60)
Glucose, Bld: 105 mg/dL — ABNORMAL HIGH (ref 70–99)
Potassium: 3.9 mmol/L (ref 3.5–5.1)
Sodium: 138 mmol/L (ref 135–145)
Total Bilirubin: 0.8 mg/dL (ref 0.3–1.2)
Total Protein: 7.6 g/dL (ref 6.5–8.1)

## 2023-02-13 LAB — GLIADIN ANTIBODIES, SERUM
Antigliadin Abs, IgA: 6 units (ref 0–19)
Gliadin IgG: 2 units (ref 0–19)

## 2023-02-13 LAB — TISSUE TRANSGLUTAMINASE, IGA: Tissue Transglutaminase Ab, IgA: <2 U/mL (ref 0–3)

## 2023-02-14 ENCOUNTER — Ambulatory Visit (HOSPITAL_COMMUNITY)
Admission: RE | Admit: 2023-02-14 | Discharge: 2023-02-14 | Disposition: A | Discharge status: Home / Self Care | Payer: Medicare Other | Attending: Gastroenterology | Admitting: Gastroenterology

## 2023-02-14 ENCOUNTER — Ambulatory Visit (HOSPITAL_BASED_OUTPATIENT_CLINIC_OR_DEPARTMENT_OTHER): Payer: Medicare Other | Admitting: Certified Registered"

## 2023-02-14 ENCOUNTER — Encounter (HOSPITAL_COMMUNITY)
Admission: RE | Disposition: A | Discharge status: Home / Self Care | Payer: Self-pay | Source: Home / Self Care | Attending: Gastroenterology

## 2023-02-14 ENCOUNTER — Ambulatory Visit (HOSPITAL_COMMUNITY): Payer: Medicare Other | Admitting: Certified Registered"

## 2023-02-14 ENCOUNTER — Encounter (HOSPITAL_COMMUNITY): Payer: Self-pay | Admitting: Gastroenterology

## 2023-02-14 DIAGNOSIS — E119 Type 2 diabetes mellitus without complications: Secondary | ICD-10-CM | POA: Insufficient documentation

## 2023-02-14 DIAGNOSIS — K648 Other hemorrhoids: Secondary | ICD-10-CM | POA: Diagnosis not present

## 2023-02-14 DIAGNOSIS — F32A Depression, unspecified: Secondary | ICD-10-CM | POA: Insufficient documentation

## 2023-02-14 DIAGNOSIS — Z86010 Personal history of colonic polyps: Secondary | ICD-10-CM

## 2023-02-14 DIAGNOSIS — K649 Unspecified hemorrhoids: Secondary | ICD-10-CM | POA: Diagnosis not present

## 2023-02-14 DIAGNOSIS — I10 Essential (primary) hypertension: Secondary | ICD-10-CM

## 2023-02-14 DIAGNOSIS — K635 Polyp of colon: Secondary | ICD-10-CM | POA: Diagnosis not present

## 2023-02-14 DIAGNOSIS — D123 Benign neoplasm of transverse colon: Secondary | ICD-10-CM

## 2023-02-14 DIAGNOSIS — D175 Benign lipomatous neoplasm of intra-abdominal organs: Secondary | ICD-10-CM | POA: Diagnosis not present

## 2023-02-14 DIAGNOSIS — D120 Benign neoplasm of cecum: Secondary | ICD-10-CM

## 2023-02-14 DIAGNOSIS — R195 Other fecal abnormalities: Secondary | ICD-10-CM

## 2023-02-14 DIAGNOSIS — Z1211 Encounter for screening for malignant neoplasm of colon: Secondary | ICD-10-CM

## 2023-02-14 DIAGNOSIS — D124 Benign neoplasm of descending colon: Secondary | ICD-10-CM | POA: Diagnosis not present

## 2023-02-14 DIAGNOSIS — E785 Hyperlipidemia, unspecified: Secondary | ICD-10-CM | POA: Diagnosis not present

## 2023-02-14 DIAGNOSIS — Z87891 Personal history of nicotine dependence: Secondary | ICD-10-CM

## 2023-02-14 DIAGNOSIS — K5730 Diverticulosis of large intestine without perforation or abscess without bleeding: Secondary | ICD-10-CM

## 2023-02-14 DIAGNOSIS — M1990 Unspecified osteoarthritis, unspecified site: Secondary | ICD-10-CM | POA: Insufficient documentation

## 2023-02-14 DIAGNOSIS — K5790 Diverticulosis of intestine, part unspecified, without perforation or abscess without bleeding: Secondary | ICD-10-CM

## 2023-02-14 DIAGNOSIS — Z1212 Encounter for screening for malignant neoplasm of rectum: Secondary | ICD-10-CM

## 2023-02-14 HISTORY — PX: POLYPECTOMY: SHX5525

## 2023-02-14 HISTORY — PX: COLONOSCOPY WITH PROPOFOL: SHX5780

## 2023-02-14 LAB — GLUCOSE, CAPILLARY: Glucose-Capillary: 115 mg/dL — ABNORMAL HIGH (ref 70–99)

## 2023-02-14 LAB — RETICULIN ANTIBODIES, IGA W TITER: Reticulin Ab, IgA: NEGATIVE titer (ref <2.5)

## 2023-02-14 SURGERY — COLONOSCOPY WITH PROPOFOL
Anesthesia: General

## 2023-02-14 MED ORDER — LIDOCAINE HCL (CARDIAC) PF 100 MG/5ML IV SOSY
PREFILLED_SYRINGE | INTRAVENOUS | Status: DC | PRN
  Administered 2023-02-14: 80 mg via INTRAVENOUS

## 2023-02-14 MED ORDER — STERILE WATER FOR IRRIGATION IR SOLN
Status: DC | PRN
  Administered 2023-02-14: 60 mL

## 2023-02-14 MED ORDER — LIDOCAINE HCL (PF) 2 % IJ SOLN
INTRAMUSCULAR | Status: AC
  Filled 2023-02-14: qty 20

## 2023-02-14 MED ORDER — PROPOFOL 500 MG/50ML IV EMUL
INTRAVENOUS | Status: DC | PRN
  Administered 2023-02-14: 150 ug/kg/min via INTRAVENOUS

## 2023-02-14 MED ORDER — PROPOFOL 500 MG/50ML IV EMUL: INTRAVENOUS | Status: DC | PRN

## 2023-02-14 MED ORDER — LACTATED RINGERS IV SOLN: INTRAVENOUS | Status: DC | PRN

## 2023-02-14 MED ORDER — PHENYLEPHRINE 80 MCG/ML (10ML) SYRINGE FOR IV PUSH (FOR BLOOD PRESSURE SUPPORT)
PREFILLED_SYRINGE | INTRAVENOUS | Status: AC
  Filled 2023-02-14: qty 10

## 2023-02-14 MED ORDER — PROPOFOL 1000 MG/100ML IV EMUL
INTRAVENOUS | Status: AC
  Filled 2023-02-14: qty 100

## 2023-02-14 MED ORDER — PROPOFOL 10 MG/ML IV BOLUS
INTRAVENOUS | Status: DC | PRN
  Administered 2023-02-14: 80 mg via INTRAVENOUS

## 2023-02-14 MED ORDER — LACTATED RINGERS IV SOLN: INTRAVENOUS | Status: DC

## 2023-02-14 MED ORDER — PHENYLEPHRINE 80 MCG/ML (10ML) SYRINGE FOR IV PUSH (FOR BLOOD PRESSURE SUPPORT)
PREFILLED_SYRINGE | INTRAVENOUS | Status: DC | PRN
  Administered 2023-02-14 (×4): 160 ug via INTRAVENOUS

## 2023-02-14 NOTE — Transfer of Care (Addendum)
Immediate Anesthesia Transfer of Care Note  Patient: Kelli Moss  Procedure(s) Performed: COLONOSCOPY WITH PROPOFOL POLYPECTOMY  Patient Location: PACU  Anesthesia Type:General  Level of Consciousness: awake and patient cooperative  Airway & Oxygen Therapy: Patient Spontanous Breathing and Patient connected to face mask oxygen  Post-op Assessment: Report given to RN and Post -op Vital signs reviewed and stable  Post vital signs: Reviewed and stable  Last Vitals:  Vitals Value Taken Time  BP 107/47 02/14/23   0818  Temp 36.5 02/14/23   0818  Pulse 73 02/14/23   0818  Resp 16 02/14/23   0818  SpO2 100% 02/14/23   0818    Last Pain:  Vitals:   02/14/23 0740  TempSrc:   PainSc: 2       Patients Stated Pain Goal: 7 (02/14/23 0714)  Complications: No notable events documented.

## 2023-02-14 NOTE — Op Note (Signed)
[facility] Patient Name: Kelli Moss Procedure Date: 02/14/2023 7:19 AM MRN: [id number] Date of Birth: August 23, 1950 Attending MD: Katrinka Blazing , , [id number] CSN: [id number] Age: 72 Admit Type: Outpatient Procedure:                Colonoscopy Indications:              Positive Cologuard test Providers:                Katrinka Blazing, Angelica Ran, Zena Amos Referring MD:              Medicines:                Monitored Anesthesia Care Complications:            No immediate complications. Estimated Blood Loss:     Estimated blood loss: none. Procedure:                Pre-Anesthesia Assessment:                           - Prior to the procedure, a History and Physical                            was performed, and patient medications, allergies                            and sensitivities were reviewed. The patient's                            tolerance of previous anesthesia was reviewed.                           - The risks and benefits of the procedure and the                            sedation options and risks were discussed with the                            patient. All questions were answered and informed                            consent was obtained.                           - ASA Grade Assessment: III - A patient with severe                            systemic disease.                           After obtaining informed consent, the colonoscope                            was passed under direct vision. Throughout the                            procedure, the patient's blood pressure, pulse, and  oxygen saturations were monitored continuously. The                            [id number]-HQ190L ([id number]) scope was introduced through                            the anus and advanced to the the cecum, identified                            by appendiceal orifice and ileocecal valve. The                            colonoscopy was performed  without difficulty. The                            patient tolerated the procedure well. The quality                            of the bowel preparation was adequate. Scope In: 7:46:50 AM Scope Out: 8:10:25 AM Scope Withdrawal Time: 0 hours 17 minutes 28 seconds  Total Procedure Duration: 0 hours 23 minutes 35 seconds  Findings:      The perianal and digital rectal examinations were normal.      Two sessile polyps were found in the transverse colon and cecum. The       polyps were 3 to 5 mm in size. These polyps were removed with a cold       snare. Resection and retrieval were complete.      There was a medium-sized lipoma, in the transverse colon.      A 5 mm polyp was found in the descending colon. The polyp was sessile.       The polyp was removed with a cold snare. Resection and retrieval were       complete.      Scattered medium-mouthed diverticula were found in the sigmoid colon,       descending colon and transverse colon.      Non-bleeding internal hemorrhoids were found during retroflexion. The       hemorrhoids were small. Impression:               - Two 3 to 5 mm polyps in the transverse colon and                            in the cecum, removed with a cold snare. Resected                            and retrieved.                           - Medium-sized lipoma in the transverse colon.                           - One 5 mm polyp in the descending colon, removed                            with a cold snare. Resected  and retrieved.                           - Diverticulosis in the sigmoid colon, in the                            descending colon and in the transverse colon.                           - Non-bleeding internal hemorrhoids. Moderate Sedation:      Per Anesthesia Care Recommendation:           - Discharge patient to home (ambulatory).                           - Resume previous diet.                           - Await pathology results.                            - Repeat colonoscopy in 5 years for surveillance. Procedure Code(s):        --- Professional ---                           [phone], Colonoscopy, flexible; with removal of                            tumor(s), polyp(s), or other lesion(s) by snare                            technique Diagnosis Code(s):        --- Professional ---                           D12.3, Benign neoplasm of transverse colon (hepatic                            flexure or splenic flexure)                           D12.0, Benign neoplasm of cecum                           D17.5, Benign lipomatous neoplasm of                            intra-abdominal organs                           D12.4, Benign neoplasm of descending colon                           K64.8, Other hemorrhoids                           R19.5, Other fecal abnormalities  K57.30, Diverticulosis of large intestine without                            perforation or abscess without bleeding CPT copyright 2022 American Medical Association. All rights reserved. The codes documented in this report are preliminary and upon coder review may  be revised to meet current compliance requirements. Katrinka Blazing, MD Katrinka Blazing,  02/14/2023 8:15:46 AM This report has been signed electronically. Number of Addenda: 0

## 2023-02-14 NOTE — Discharge Instructions (Signed)
You are being discharged to home.  Resume your previous diet.  We are waiting for your pathology results.  Your physician has recommended a repeat colonoscopy in five years for surveillance.  

## 2023-02-14 NOTE — Anesthesia Preprocedure Evaluation (Addendum)
Anesthesia Evaluation  Patient identified by MRN, date of birth, ID band Patient awake    Reviewed: Allergy & Precautions, H&P , NPO status , Patient's Chart, lab work & pertinent test results, reviewed documented beta blocker date and time   History of Anesthesia Complications (+) history of anesthetic complications  Airway Mallampati: II  TM Distance: >3 FB Neck ROM: limited    Dental no notable dental hx.    Pulmonary former smoker   Pulmonary exam normal breath sounds clear to auscultation       Cardiovascular Exercise Tolerance: Good hypertension,  Rhythm:regular Rate:Normal     Neuro/Psych  PSYCHIATRIC DISORDERS  Depression     Neuromuscular disease negative neurological ROS  negative psych ROS   GI/Hepatic negative GI ROS, Neg liver ROS,,,  Endo/Other  diabetes    Renal/GU negative Renal ROS  negative genitourinary   Musculoskeletal  (+) Arthritis ,    Abdominal   Peds  Hematology negative hematology ROS (+)   Anesthesia Other Findings   Reproductive/Obstetrics negative OB ROS                             Anesthesia Physical Anesthesia Plan  ASA: 3  Anesthesia Plan: General   Post-op Pain Management:    Induction:   PONV Risk Score and Plan: Propofol infusion  Airway Management Planned:   Additional Equipment:   Intra-op Plan:   Post-operative Plan:   Informed Consent: I have reviewed the patients History and Physical, chart, labs and discussed the procedure including the risks, benefits and alternatives for the proposed anesthesia with the patient or authorized representative who has indicated his/her understanding and acceptance.     Dental Advisory Given  Plan Discussed with: CRNA  Anesthesia Plan Comments:        Anesthesia Quick Evaluation

## 2023-02-14 NOTE — H&P (Signed)
Kelli Moss is an 72 y.o. female.   Chief Complaint:  HPI: Kelli Moss is a 72 y.o. female with past medical history of depression, DM, dyslipidemia, HTN, peripheral neuropathy, PKI disease, coming for positive Cologuard.  Last Colonoscopy: 2015 diverticula in sigmoid, polyp in rectum-hyperplastic. The patient denies having any complaints such as melena, hematochezia, abdominal pain or distention, change in her bowel movement consistency or frequency, no changes in weight recently.  Grandmother had colon cancer.   Past Medical History:  Diagnosis Date   Colon polyp    Complication of anesthesia    CTS (carpal tunnel syndrome)    Depression    Diabetes mellitus without complication (HCC)    prediabetes   Dyslipidemia    Gastritis    Hypertension    Neuromuscular disorder (HCC)    peripheral neuropathy    History reviewed. No pertinent surgical history.  History reviewed. No pertinent family history. Social History:  reports that she quit smoking about 35 years ago. Her smoking use included cigarettes. She has been exposed to tobacco smoke. She has never used smokeless tobacco. She reports that she does not drink alcohol and does not use drugs.  Allergies:  Allergies  Allergen Reactions   Lyrica [Pregabalin]     nightmares    Medications Prior to Admission  Medication Sig Dispense Refill   amLODipine (NORVASC) 10 MG tablet Take 1 tablet by mouth once daily 90 tablet 3   ammonium lactate (AMLACTIN) 12 % cream Apply 1 Application topically as needed for dry skin.     aspirin 81 MG tablet Take 81 mg by mouth daily.     atorvastatin (LIPITOR) 10 MG tablet Take 1 tablet (10 mg total) by mouth daily. 90 tablet 3   CINNAMON PO Take 2,000 mg by mouth daily.     cyanocobalamin (VITAMIN B12) 1000 MCG/ML injection INJECT 1 ML IN THE MUSCLE ONCE MONTHLY 30 mL 3   diazepam (VALIUM) 10 MG tablet TAKE 1 TABLET BY MOUTH EVERY 8 HOURS AS NEEDED FOR MUSCLE SPASM 60 tablet 0    Efinaconazole 10 % SOLN As directed for toenail fungus (Patient taking differently: Apply 1 Application topically daily as needed (toenail fungus).) 8 mL 3   Flaxseed, Linseed, (FLAX SEEDS PO) Take 1 capsule by mouth daily.     fluticasone (FLONASE) 50 MCG/ACT nasal spray Place 1 spray into both nostrils daily as needed for allergies.     hydrochlorothiazide (MICROZIDE) 12.5 MG capsule Take 1 capsule (12.5 mg total) by mouth as needed. 90 capsule 1   LORazepam (ATIVAN) 1 MG tablet Take 1 tablet (1 mg total) by mouth at bedtime as needed. 90 tablet 0   losartan (COZAAR) 100 MG tablet Take 1 tablet by mouth once daily 90 tablet 3   Magnesium 500 MG TABS Take 500 mg by mouth daily.     meloxicam (MOBIC) 7.5 MG tablet Take 1 tablet (7.5 mg total) by mouth daily. 30 tablet 0   metoprolol succinate (TOPROL-XL) 50 MG 24 hr tablet TAKE 1.5 TABLET BY MOUTH ONCE DAILY WITH OR IMMEDIATELY FOLLOWING A MEAL 135 tablet 2   Misc Natural Products (BLACK CHERRY CONCENTRATE PO) Take 1 capsule by mouth daily.     Multiple Vitamins-Minerals (PRESERVISION AREDS 2 PO) Take 1 capsule by mouth in the morning and at bedtime.     omega-3 acid ethyl esters (LOVAZA) 1 g capsule Take 2 capsules (2 g total) by mouth 2 (two) times daily. 360 capsule 3  ondansetron (ZOFRAN) 4 MG tablet Take 1 tablet (4 mg total) by mouth every 8 (eight) hours as needed for nausea or vomiting. 6 tablet 0   phenazopyridine (PYRIDIUM) 100 MG tablet Take 1 tablet (100 mg total) by mouth 2 (two) times daily as needed for pain (urinray pain). 20 tablet 0   Polyethyl Glycol-Propyl Glycol (SYSTANE OP) Place 1 drop into both eyes as needed (dry eyes).     tiZANidine (ZANAFLEX) 4 MG tablet TAKE 1 TABLET BY MOUTH EVERY 8 HOURS AS NEEDED FOR MUSCLE SPASM 90 tablet 0   TURMERIC PO Take 1,000 mg by mouth daily.     UNABLE TO FIND Med Name: TENS UNIT DX: M54.2; M54.9 1 Units 0   Vitamin D, Ergocalciferol, (DRISDOL) 1.25 MG (50000 UNIT) CAPS capsule Take 1  capsule (50,000 Units total) by mouth every 7 (seven) days. 12 capsule 0   ezetimibe (ZETIA) 10 MG tablet Take 1 tablet (10 mg total) by mouth daily. 90 tablet 3   Semaglutide, 2 MG/DOSE, 8 MG/3ML SOPN Inject 2 mg as directed once a week. 3 mL 11    Results for orders placed or performed during the hospital encounter of 02/14/23 (from the past 48 hour(s))  Glucose, capillary     Status: Abnormal   Collection Time: 02/14/23  7:08 AM  Result Value Ref Range   Glucose-Capillary 115 (H) 70 - 99 mg/dL    Comment: Glucose reference range applies only to samples taken after fasting for at least 8 hours.   No results found.  Review of Systems  All other systems reviewed and are negative.   Blood pressure 111/61, pulse 71, temperature 98.1 F (36.7 C), temperature source Oral, resp. rate 18, SpO2 96 %. Physical Exam  GENERAL: The patient is AO x3, in no acute distress. HEENT: Head is normocephalic and atraumatic. EOMI are intact. Mouth is well hydrated and without lesions. NECK: Supple. No masses LUNGS: Clear to auscultation. No presence of rhonchi/wheezing/rales. Adequate chest expansion HEART: RRR, normal s1 and s2. ABDOMEN: Soft, nontender, no guarding, no peritoneal signs, and nondistended. BS +. No masses. EXTREMITIES: Without any cyanosis, clubbing, rash, lesions or edema. NEUROLOGIC: AOx3, no focal motor deficit. SKIN: no jaundice, no rashes  Assessment/Plan LARYSA PALL is a 72 y.o. female with past medical history of depression, DM, dyslipidemia, HTN, peripheral neuropathy, PKI disease, coming for positive Cologuard. Will proceed with colonoscopy.  Dolores Frame, MD 02/14/2023, 7:31 AM

## 2023-02-15 NOTE — Anesthesia Postprocedure Evaluation (Signed)
Anesthesia Post Note  Patient: Kelli Moss  Procedure(s) Performed: COLONOSCOPY WITH PROPOFOL POLYPECTOMY  Patient location during evaluation: Phase II Anesthesia Type: General Level of consciousness: awake Pain management: pain level controlled Vital Signs Assessment: post-procedure vital signs reviewed and stable Respiratory status: spontaneous breathing and respiratory function stable Cardiovascular status: blood pressure returned to baseline and stable Postop Assessment: no headache and no apparent nausea or vomiting Anesthetic complications: no Comments: Late entry   No notable events documented.   Last Vitals:  Vitals:   02/14/23 0714 02/14/23 0818  BP: 111/61 (!) 107/47  Pulse: 71 73  Resp: 18 16  Temp: 36.7 C 36.5 C  SpO2: 96% 100%    Last Pain:  Vitals:   02/14/23 0818  TempSrc: Oral  PainSc: 0-No pain                 Windell Norfolk

## 2023-02-18 ENCOUNTER — Encounter (INDEPENDENT_AMBULATORY_CARE_PROVIDER_SITE_OTHER): Payer: Self-pay | Admitting: *Deleted

## 2023-02-18 LAB — SURGICAL PATHOLOGY

## 2023-02-20 ENCOUNTER — Encounter (HOSPITAL_COMMUNITY): Payer: Self-pay | Admitting: Gastroenterology

## 2023-03-28 ENCOUNTER — Other Ambulatory Visit: Payer: Self-pay | Admitting: Family Medicine

## 2023-03-29 NOTE — Telephone Encounter (Signed)
Requested medication (s) are due for refill today: Yes  Requested medication (s) are on the active medication list: Yes  Last refill:  03/26/22  Future visit scheduled: No  Notes to clinic:  Last refill 1 year ago.    Requested Prescriptions  Pending Prescriptions Disp Refills   OZEMPIC, 2 MG/DOSE, 8 MG/3ML SOPN [Pharmacy Med Name: OZEMPIC 2 MG DOSE PEN[R]] 3 mL 11    Sig: INJECT 2MG  UNDER THE SKIN EVERY WEEK     Endocrinology:  Diabetes - GLP-1 Receptor Agonists - semaglutide Failed - 03/28/2023  6:45 PM      Failed - Valid encounter within last 6 months    Recent Outpatient Visits           1 year ago General medical exam   [facility] Donita Brooks, MD   1 year ago Dysuria   [facility] Valentino Nose, NP   2 years ago General medical exam   [facility] Donita Brooks, MD   3 years ago General medical exam   [facility] Donita Brooks, MD   4 years ago Trigger thumb, right thumb   [facility] Pickard, Priscille Heidelberg, MD              Passed - HBA1C in normal range and within 180 days    Hgb A1c MFr Bld  Date Value Ref Range Status  11/07/2022 5.3 <5.7 % of total Hgb Final    Comment:    For the purpose of screening for the presence of diabetes: . <5.7%       Consistent with the absence of diabetes 5.7-6.4%    Consistent with increased risk for diabetes             (prediabetes) > or =6.5%  Consistent with diabetes . This assay result is consistent with a decreased risk of diabetes. . Currently, no consensus exists regarding use of hemoglobin A1c for diagnosis of diabetes in children. . According to American Diabetes Association (ADA) guidelines, hemoglobin A1c <7.0% represents optimal control in non-pregnant diabetic patients. Different metrics may apply to specific patient populations.  Standards of Medical Care in Diabetes(ADA). .          Passed - Cr in  normal range and within 360 days    Creat  Date Value Ref Range Status  11/07/2022 0.61 0.60 - 1.00 mg/dL Final   Creatinine, Ser  Date Value Ref Range Status  02/12/2023 0.64 0.44 - 1.00 mg/dL Final   Creatinine, Urine  Date Value Ref Range Status  11/07/2022 118 20 - 275 mg/dL Final

## 2023-04-17 ENCOUNTER — Ambulatory Visit: Payer: Medicare Other | Admitting: Podiatry

## 2023-04-22 ENCOUNTER — Other Ambulatory Visit: Payer: Self-pay | Admitting: Cardiovascular Disease

## 2023-04-24 ENCOUNTER — Other Ambulatory Visit: Payer: Self-pay

## 2023-04-24 MED ORDER — OMEGA-3-ACID ETHYL ESTERS 1 G PO CAPS: 2.0000 g | ORAL_CAPSULE | Freq: Two times a day (BID) | ORAL | 2 refills | Status: DC

## 2023-06-18 ENCOUNTER — Other Ambulatory Visit: Payer: Self-pay | Admitting: Family Medicine

## 2023-06-19 NOTE — Telephone Encounter (Signed)
Last OV 11/08/22 with protocol.  Requested Prescriptions  Pending Prescriptions Disp Refills   cyanocobalamin (VITAMIN B12) 1000 MCG/ML injection [Pharmacy Med Name: CYANOCOBALAMIN 1,000 MCG/ML VL] 30 mL 0    Sig: INJECT 1 ML INTRAMUSCULARLY ONCE A MONTH     Endocrinology:  Vitamins - Vitamin B12 Failed - 06/18/2023 12:31 PM      Failed - HCT in normal range and within 360 days    HCT  Date Value Ref Range Status  11/07/2022 45.3 (H) 35.0 - 45.0 % Final         Failed - Valid encounter within last 12 months    Recent Outpatient Visits           1 year ago General medical exam   [facility] Donita Brooks, MD   1 year ago Dysuria   [facility] Valentino Nose, NP   2 years ago General medical exam   [facility] Donita Brooks, MD   3 years ago General medical exam   [facility] Donita Brooks, MD   4 years ago Trigger thumb, right thumb   [facility] Pickard, Priscille Heidelberg, MD              Passed - HGB in normal range and within 360 days    Hemoglobin  Date Value Ref Range Status  11/07/2022 15.1 11.7 - 15.5 g/dL Final         Passed - B12 Level in normal range and within 360 days    Vitamin B-12  Date Value Ref Range Status  11/07/2022 459 200 - 1,100 pg/mL Final

## 2023-06-21 ENCOUNTER — Other Ambulatory Visit: Payer: Self-pay | Admitting: Cardiovascular Disease

## 2023-07-22 ENCOUNTER — Ambulatory Visit: Payer: Medicare Other | Admitting: Podiatry

## 2023-07-23 DIAGNOSIS — H1131 Conjunctival hemorrhage, right eye: Secondary | ICD-10-CM | POA: Diagnosis not present

## 2023-08-07 ENCOUNTER — Other Ambulatory Visit: Payer: Self-pay | Admitting: Family Medicine

## 2023-08-12 ENCOUNTER — Ambulatory Visit (INDEPENDENT_AMBULATORY_CARE_PROVIDER_SITE_OTHER): Payer: Medicare Other | Admitting: Family Medicine

## 2023-08-12 ENCOUNTER — Encounter: Payer: Self-pay | Admitting: Family Medicine

## 2023-08-12 VITALS — BP 136/80 | HR 90 | Temp 98.2°F | Ht 67.0 in | Wt 222.2 lb

## 2023-08-12 DIAGNOSIS — M79642 Pain in left hand: Secondary | ICD-10-CM | POA: Diagnosis not present

## 2023-08-12 DIAGNOSIS — R300 Dysuria: Secondary | ICD-10-CM

## 2023-08-12 DIAGNOSIS — Z7985 Long-term (current) use of injectable non-insulin antidiabetic drugs: Secondary | ICD-10-CM | POA: Diagnosis not present

## 2023-08-12 DIAGNOSIS — E119 Type 2 diabetes mellitus without complications: Secondary | ICD-10-CM | POA: Diagnosis not present

## 2023-08-12 DIAGNOSIS — M79641 Pain in right hand: Secondary | ICD-10-CM | POA: Diagnosis not present

## 2023-08-12 LAB — URINALYSIS, ROUTINE W REFLEX MICROSCOPIC
Bilirubin Urine: NEGATIVE
Casts: NONE SEEN /[LPF]
Crystals: NONE SEEN /[HPF]
Glucose, UA: NEGATIVE
Ketones, ur: NEGATIVE
Nitrite: POSITIVE — AB
Specific Gravity, Urine: 1.025 (ref 1.001–1.035)
WBC, UA: >=60 /[HPF] — AB (ref 0–5)
Yeast: NONE SEEN /[HPF]
pH: 5.5 (ref 5.0–8.0)

## 2023-08-12 LAB — MICROSCOPIC MESSAGE

## 2023-08-12 MED ORDER — CELECOXIB 200 MG PO CAPS: 200.0000 mg | ORAL_CAPSULE | Freq: Two times a day (BID) | ORAL | 1 refills | Status: DC

## 2023-08-12 NOTE — Progress Notes (Signed)
Subjective:    Patient ID: Kelli Moss, female    DOB: 1951/07/16, 72 y.o.   MRN: [id number]  Hand Pain   Neck Pain    Patient is dealing today with several concerns.  First she complains of bilateral hand pain.  Every morning her fingers feel stiff for 30 minutes to an hour.  The stiffness is in the MCP, PIP, and DIP joints.  She is not currently taking any NSAIDs.  She used to be on meloxicam but she stopped that over concerns about her kidneys.  She also complains of neck pain.  The patient has severe degenerative disc disease with profound limitations in her range of motion.  She is taking tizanidine for this.  She is overdue to check her lab work for diabetes.  Her A1c is over no longer lipid panel.  She taking Azo over-the-counter for urinary frequency and urgency.  She states that she has been doing this for a long period of time.  In the past, she was diagnosed with interstitial cystitis.  Her biggest symptom is frequency.  I explained that the Azo may be masking a urinary tract infection.  Past Medical History:  Diagnosis Date  . Colon polyp   . Complication of anesthesia   . CTS (carpal tunnel syndrome)   . Depression   . Diabetes mellitus without complication (HCC)    prediabetes  . Dyslipidemia   . Gastritis   . Hypertension   . Neuromuscular disorder (HCC)    peripheral neuropathy   Past Surgical History:  Procedure Laterality Date  . COLONOSCOPY WITH PROPOFOL N/A 02/14/2023   Procedure: COLONOSCOPY WITH PROPOFOL;  Surgeon: Dolores Frame, MD;  [facility]: [facility];  Service: Gastroenterology;  Laterality: N/A;  8:00am;asa 3  . POLYPECTOMY  02/14/2023   Procedure: POLYPECTOMY;  Surgeon: Dolores Frame, MD;  Location: [facility];  Service: Gastroenterology;;   Current Outpatient Medications on File Prior to Visit  Medication Sig Dispense Refill  . amLODipine (NORVASC) 10 MG tablet Take 1 tablet by mouth once daily 90 tablet 3  . ammonium  lactate (AMLACTIN) 12 % cream Apply 1 Application topically as needed for dry skin.    Marland Kitchen aspirin 81 MG tablet Take 81 mg by mouth daily.    Marland Kitchen atorvastatin (LIPITOR) 10 MG tablet TAKE ONE TABLET BY MOUTH ONE TIME DAILY 90 tablet 3  . CINNAMON PO Take 2,000 mg by mouth daily.    . cyanocobalamin (VITAMIN B12) 1000 MCG/ML injection INJECT 1 ML INTRAMUSCULARLY ONCE A MONTH 30 mL 0  . diazepam (VALIUM) 10 MG tablet TAKE 1 TABLET BY MOUTH EVERY 8 HOURS AS NEEDED FOR MUSCLE SPASM 60 tablet 0  . Efinaconazole 10 % SOLN As directed for toenail fungus (Patient taking differently: Apply 1 Application topically daily as needed (toenail fungus).) 8 mL 3  . fluticasone (FLONASE) 50 MCG/ACT nasal spray Place 1 spray into both nostrils daily as needed for allergies.    . hydrochlorothiazide (MICROZIDE) 12.5 MG capsule Take 1 capsule (12.5 mg total) by mouth as needed. 90 capsule 1  . LORazepam (ATIVAN) 1 MG tablet Take 1 tablet (1 mg total) by mouth at bedtime as needed. 90 tablet 0  . losartan (COZAAR) 100 MG tablet Take 1 tablet by mouth once daily 90 tablet 3  . Magnesium 500 MG TABS Take 500 mg by mouth daily.    . metoprolol succinate (TOPROL-XL) 50 MG 24 hr tablet TAKE ONE AND ONE-HALF TABLETS BY MOUTH  ONCE DAILY WITH OR IMMEDIATELY FOLLOWING A MEAL 90 tablet 2  . Multiple Vitamins-Minerals (PRESERVISION AREDS 2 PO) Take 1 capsule by mouth in the morning and at bedtime.    Marland Kitchen omega-3 acid ethyl esters (LOVAZA) 1 g capsule Take 2 capsules (2 g total) by mouth 2 (two) times daily. 360 capsule 2  . ondansetron (ZOFRAN) 4 MG tablet Take 1 tablet (4 mg total) by mouth every 8 (eight) hours as needed for nausea or vomiting. 6 tablet 0  . OZEMPIC, 2 MG/DOSE, 8 MG/3ML SOPN INJECT 2MG  UNDER THE SKIN EVERY WEEK 3 mL 11  . phenazopyridine (PYRIDIUM) 100 MG tablet Take 1 tablet (100 mg total) by mouth 2 (two) times daily as needed for pain (urinray pain). 20 tablet 0  . Polyethyl Glycol-Propyl Glycol (SYSTANE OP) Place 1  drop into both eyes as needed (dry eyes).    Marland Kitchen tiZANidine (ZANAFLEX) 4 MG tablet TAKE 1 TABLET BY MOUTH EVERY 8 HOURS AS NEEDED FOR MUSCLE SPASM 90 tablet 0  . Vitamin D, Ergocalciferol, (DRISDOL) 1.25 MG (50000 UNIT) CAPS capsule Take 1 capsule (50,000 Units total) by mouth every 7 (seven) days. 12 capsule 0  . ezetimibe (ZETIA) 10 MG tablet Take 1 tablet (10 mg total) by mouth daily. 90 tablet 3  . Flaxseed, Linseed, (FLAX SEEDS PO) Take 1 capsule by mouth daily. (Patient not taking: Reported on 08/12/2023)    . Misc Natural Products (BLACK CHERRY CONCENTRATE PO) Take 1 capsule by mouth daily. (Patient not taking: Reported on 08/12/2023)    . TURMERIC PO Take 1,000 mg by mouth daily. (Patient not taking: Reported on 08/12/2023)    . UNABLE TO FIND Med Name: TENS UNIT DX: M54.2; M54.9 1 Units 0   No current facility-administered medications on file prior to visit.   Allergies  Allergen Reactions  . Lyrica [Pregabalin]     nightmares   Social History   Socioeconomic History  . Marital status: Married    Spouse name: Not on file  . Number of children: Not on file  . Years of education: Not on file  . Highest education level: Not on file  Occupational History  . Not on file  Tobacco Use  . Smoking status: Former    Current packs/day: 0.00    Types: Cigarettes    Quit date: 01/23/1988    Years since quitting: 35.5    Passive exposure: Past  . Smokeless tobacco: Never  Vaping Use  . Vaping status: Never Used  Substance and Sexual Activity  . Alcohol use: No  . Drug use: No  . Sexual activity: Not on file  Other Topics Concern  . Not on file  Social History Narrative  . Not on file   Social Drivers of Health   Financial Resource Strain: Low Risk  (04/01/2020)   Overall Financial Resource Strain (CARDIA)   . Difficulty of Paying Living Expenses: Not very hard  Food Insecurity: Not on file  Transportation Needs: Not on file  Physical Activity: Not on file  Stress: Not on  file  Social Connections: Not on file  Intimate Partner Violence: Not on file   History reviewed. No pertinent family history.    Review of Systems  Musculoskeletal:  Positive for neck pain.  All other systems reviewed and are negative.      Objective:   Physical Exam Vitals reviewed.  Constitutional:      General: She is not in acute distress.    Appearance: She is well-developed.  She is not diaphoretic.  HENT:     Head: Normocephalic and atraumatic.  Neck:     Thyroid: No thyromegaly.     Vascular: No JVD.     Trachea: No tracheal deviation.  Cardiovascular:     Rate and Rhythm: Normal rate and regular rhythm.     Heart sounds: Normal heart sounds. No murmur heard.    No friction rub. No gallop.  Pulmonary:     Effort: Pulmonary effort is normal. No respiratory distress.     Breath sounds: Normal breath sounds. No stridor. No wheezing or rales.  Chest:     Chest wall: No tenderness.  Musculoskeletal:     Cervical back: Rigidity present.  Lymphadenopathy:     Cervical: No cervical adenopathy.  Neurological:     Mental Status: She is alert and oriented to person, place, and time.     Cranial Nerves: No cranial nerve deficit.     Motor: No abnormal muscle tone.     Coordination: Coordination normal.     Deep Tendon Reflexes: Reflexes are normal and symmetric.  Psychiatric:        Mood and Affect: Affect is labile and tearful.   Wt Readings from Last 3 Encounters:  08/12/23 222 lb 4 oz (100.8 kg)  02/12/23 238 lb 15.7 oz (108.4 kg)  12/24/22 239 lb (108.4 kg)         Assessment & Plan:  Dysuria - Plan: Urinalysis, Routine w reflex microscopic, Urine Culture  Diabetes mellitus without complication (HCC) - Plan: Hemoglobin A1c, COMPLETE METABOLIC PANEL WITH GFR, CBC with Differential/Platelet, Lipid panel  Pain in both hands - Plan: Rheumatoid factor I suspect that the dysuria may be a urinary tract infection.  I will check a urinalysis and a urine culture.   If urinalysis and urine culture are normal, I would recommend trying Vesicare or Myrbetriq for overactive bladder to help with urinary frequency.  Patient is accompanied in [place] today along with a fasting lipid.  I would like to see her A1c less than 6.5.  I like to see her LDL cholesterol less than 55.  I believe the stiffness and pain in her hands is due to osteoarthritis.  I recommended trying Voltaren gel over-the-counter.  She states that she is worried that this would get in her eyes.  Therefore I recommended trying Celebrex 200 mg twice daily as needed.  I explained that this would be milder on her stomach although we will still have to watch her renal function.  I will check for rheumatoid arthritis by checking a rheumatoid factor.

## 2023-08-14 LAB — RHEUMATOID FACTOR: Rheumatoid fact SerPl-aCnc: <10 [IU]/mL (ref <14)

## 2023-08-14 LAB — URINE CULTURE
MICRO NUMBER:: 15883031
SPECIMEN QUALITY:: ADEQUATE

## 2023-08-14 LAB — COMPLETE METABOLIC PANEL WITH GFR
AG Ratio: 1.5 (calc) (ref 1.0–2.5)
ALT: 17 U/L (ref 6–29)
AST: 19 U/L (ref 10–35)
Albumin: 4.5 g/dL (ref 3.6–5.1)
Alkaline phosphatase (APISO): 76 U/L (ref 37–153)
BUN: 13 mg/dL (ref 7–25)
CO2: 24 mmol/L (ref 20–32)
Calcium: 9.2 mg/dL (ref 8.6–10.4)
Chloride: 106 mmol/L (ref 98–110)
Creat: 0.61 mg/dL (ref 0.60–1.00)
Globulin: 3 g/dL (ref 1.9–3.7)
Glucose, Bld: 100 mg/dL — ABNORMAL HIGH (ref 65–99)
Potassium: 4 mmol/L (ref 3.5–5.3)
Sodium: 141 mmol/L (ref 135–146)
Total Bilirubin: 0.7 mg/dL (ref 0.2–1.2)
Total Protein: 7.5 g/dL (ref 6.1–8.1)
eGFR: 95 mL/min/{1.73_m2} (ref >=60)

## 2023-08-14 LAB — CBC WITH DIFFERENTIAL/PLATELET
Absolute Lymphocytes: 2009 {cells}/uL (ref 850–3900)
Absolute Monocytes: 511 {cells}/uL (ref 200–950)
Basophils Absolute: 57 {cells}/uL (ref 0–200)
Basophils Relative: 0.8 %
Eosinophils Absolute: 142 {cells}/uL (ref 15–500)
Eosinophils Relative: 2 %
HCT: 44.6 % (ref 35.0–45.0)
Hemoglobin: 14.9 g/dL (ref 11.7–15.5)
MCH: 28.8 pg (ref 27.0–33.0)
MCHC: 33.4 g/dL (ref 32.0–36.0)
MCV: 86.1 fL (ref 80.0–100.0)
MPV: 10.3 fL (ref 7.5–12.5)
Monocytes Relative: 7.2 %
Neutro Abs: 4381 {cells}/uL (ref 1500–7800)
Neutrophils Relative %: 61.7 %
Platelets: 271 10*3/uL (ref 140–400)
RBC: 5.18 10*6/uL — ABNORMAL HIGH (ref 3.80–5.10)
RDW: 12.7 % (ref 11.0–15.0)
Total Lymphocyte: 28.3 %
WBC: 7.1 10*3/uL (ref 3.8–10.8)

## 2023-08-14 LAB — LIPID PANEL
Cholesterol: 114 mg/dL (ref <200)
HDL: 32 mg/dL — ABNORMAL LOW (ref >=50)
LDL Cholesterol (Calc): 54 mg/dL
Non-HDL Cholesterol (Calc): 82 mg/dL (ref <130)
Total CHOL/HDL Ratio: 3.6 (calc) (ref <5.0)
Triglycerides: 215 mg/dL — ABNORMAL HIGH (ref <150)

## 2023-08-14 LAB — HEMOGLOBIN A1C
Hgb A1c MFr Bld: 5.2 %{Hb} (ref <5.7)
Mean Plasma Glucose: 103 mg/dL
eAG (mmol/L): 5.7 mmol/L

## 2023-08-15 ENCOUNTER — Other Ambulatory Visit: Payer: Self-pay | Admitting: Family Medicine

## 2023-08-15 ENCOUNTER — Ambulatory Visit: Payer: Self-pay | Admitting: Family Medicine

## 2023-08-15 MED ORDER — CEPHALEXIN 500 MG PO CAPS: 500.0000 mg | ORAL_CAPSULE | Freq: Three times a day (TID) | ORAL | 0 refills | Status: AC

## 2023-08-15 NOTE — Telephone Encounter (Signed)
Copied from CRM [id number]. Topic: Clinical - Lab/Test Results >> Aug 15, 2023  9:39 AM Carloyn Manner C wrote: Reason for CRM: Patient called in regarding her urine and blood lab results per her 08/12/2023 appointment and was told by the clinic that either way she would get a phone call follow up. Transferring to Nurse Triage Line.   Chief Complaint: Requesting lab results Disposition: [] ED /[] Urgent Care (no appt availability in office) / [] Appointment(In office/virtual)/ []  [facility]/ [x] Home Care/ [] Refused Recommended Disposition /[]  Mobile Bus/ []  Follow-up with PCP Additional Notes: Pt called requesting results from 12/23 labs. Informed pt of lab results per Dr. Caren Macadam note: "Urine culture shows ecoli uti.  I have sent in keflex to treat. Labs show blood sugar and cholesterol are excellent.  Rheumatoid arthritis test is negative." Pt verbalized understanding and will call if any concerns.  Reason for Disposition  Caller requesting lab results  (Exception: Routine or non-urgent lab result.)  Health Information question, no triage required and triager able to answer question  Answer Assessment - Initial Assessment Questions 1. REASON FOR CALL or QUESTION: "What is your reason for calling today?" or "How can I best help you?" or "What question do you have that I can help answer?"     Pt requesting lab results for 12/23 labs. 2. CALLER: Document the source of call. (e.g., laboratory, patient).     Patient  Protocols used: Information Only Call - No Triage-A-AH, PCP Call - No Triage-A-AH

## 2023-08-19 ENCOUNTER — Telehealth: Payer: Self-pay

## 2023-08-19 ENCOUNTER — Other Ambulatory Visit: Payer: Self-pay | Admitting: Family Medicine

## 2023-08-19 MED ORDER — NIRMATRELVIR/RITONAVIR (PAXLOVID)TABLET: 3.0000 | ORAL_TABLET | Freq: Two times a day (BID) | ORAL | 0 refills | Status: AC

## 2023-08-19 NOTE — Telephone Encounter (Signed)
Copied from CRM [id number]. Topic: Clinical - Medication Question >> Aug 19, 2023  8:00 AM Fonda Kinder J wrote: Reason for CRM: Pt said she tested positive for COVID and wants to know if she could be prescribed the medication she was given in the past for COVID

## 2023-08-23 ENCOUNTER — Ambulatory Visit: Payer: Medicare Other | Admitting: Family Medicine

## 2023-09-06 ENCOUNTER — Encounter: Payer: Self-pay | Admitting: Family Medicine

## 2023-09-06 ENCOUNTER — Ambulatory Visit (INDEPENDENT_AMBULATORY_CARE_PROVIDER_SITE_OTHER): Payer: Medicare Other | Admitting: Family Medicine

## 2023-09-06 VITALS — BP 146/72 | HR 116 | Temp 97.5°F | Ht 67.0 in | Wt 225.0 lb

## 2023-09-06 DIAGNOSIS — R300 Dysuria: Secondary | ICD-10-CM | POA: Diagnosis not present

## 2023-09-06 DIAGNOSIS — R3129 Other microscopic hematuria: Secondary | ICD-10-CM | POA: Diagnosis not present

## 2023-09-06 LAB — URINALYSIS, ROUTINE W REFLEX MICROSCOPIC
Glucose, UA: NEGATIVE
Hyaline Cast: NONE SEEN /[LPF]
Nitrite: POSITIVE — AB
Specific Gravity, Urine: 1.02 (ref 1.001–1.035)
WBC, UA: >=60 /[HPF] — AB (ref 0–5)
pH: 6 (ref 5.0–8.0)

## 2023-09-06 LAB — MICROSCOPIC MESSAGE

## 2023-09-06 MED ORDER — CIPROFLOXACIN HCL 500 MG PO TABS: 500.0000 mg | ORAL_TABLET | Freq: Two times a day (BID) | ORAL | 0 refills | Status: AC

## 2023-09-06 NOTE — Progress Notes (Signed)
Subjective:    Patient ID: Kelli Moss, female    DOB: 21-Jun-1951, 73 y.o.   MRN: [id number]  Hand Pain   Neck Pain   Ear Fullness  Associated symptoms include neck pain.    Patient was seen originally 12/23 with dysuria.  Urinalysis was abnormal and urine culture revealed E. coli.  She was treated with Flagyl for 7 days however she continues to have abdominal discomfort and dysuria.  Urinalysis today shows +3 leukocyte esterase, +2 blood, and positive nitrates.  Patient states that she saw urology in the past and was diagnosed with interstitial cystitis however the patient had a culture documented urinary tract infection.  She also reports bilateral ear fullness.  Tympanic membranes do show a middle ear effusion bilaterally and I suspect eustachian tube dysfunction Past Medical History:  Diagnosis Date   Colon polyp    Complication of anesthesia    CTS (carpal tunnel syndrome)    Depression    Diabetes mellitus without complication (HCC)    prediabetes   Dyslipidemia    Gastritis    Hypertension    Neuromuscular disorder (HCC)    peripheral neuropathy   Past Surgical History:  Procedure Laterality Date   COLONOSCOPY WITH PROPOFOL N/A 02/14/2023   Procedure: COLONOSCOPY WITH PROPOFOL;  Surgeon: Dolores Frame, MD;  [facility]: [facility];  Service: Gastroenterology;  Laterality: N/A;  8:00am;asa 3   POLYPECTOMY  02/14/2023   Procedure: POLYPECTOMY;  Surgeon: Marguerita Merles, Reuel Boom, MD;  Location: [facility];  Service: Gastroenterology;;   Current Outpatient Medications on File Prior to Visit  Medication Sig Dispense Refill   amLODipine (NORVASC) 10 MG tablet Take 1 tablet by mouth once daily 90 tablet 3   ammonium lactate (AMLACTIN) 12 % cream Apply 1 Application topically as needed for dry skin.     aspirin 81 MG tablet Take 81 mg by mouth daily.     atorvastatin (LIPITOR) 10 MG tablet TAKE ONE TABLET BY MOUTH ONE TIME DAILY 90 tablet 3   celecoxib  (CELEBREX) 200 MG capsule Take 1 capsule (200 mg total) by mouth 2 (two) times daily. 60 capsule 1   cephALEXin (KEFLEX) 500 MG capsule Take 1 capsule (500 mg total) by mouth 3 (three) times daily. 21 capsule 0   CINNAMON PO Take 2,000 mg by mouth daily.     cyanocobalamin (VITAMIN B12) 1000 MCG/ML injection INJECT 1 ML INTRAMUSCULARLY ONCE A MONTH 30 mL 0   diazepam (VALIUM) 10 MG tablet TAKE 1 TABLET BY MOUTH EVERY 8 HOURS AS NEEDED FOR MUSCLE SPASM 60 tablet 0   Efinaconazole 10 % SOLN As directed for toenail fungus (Patient taking differently: Apply 1 Application topically daily as needed (toenail fungus).) 8 mL 3   Flaxseed, Linseed, (FLAX SEEDS PO) Take 1 capsule by mouth daily.     fluticasone (FLONASE) 50 MCG/ACT nasal spray Place 1 spray into both nostrils daily as needed for allergies.     hydrochlorothiazide (MICROZIDE) 12.5 MG capsule Take 1 capsule (12.5 mg total) by mouth as needed. 90 capsule 1   LORazepam (ATIVAN) 1 MG tablet Take 1 tablet (1 mg total) by mouth at bedtime as needed. 90 tablet 0   losartan (COZAAR) 100 MG tablet Take 1 tablet by mouth once daily 90 tablet 3   Magnesium 500 MG TABS Take 500 mg by mouth daily.     metoprolol succinate (TOPROL-XL) 50 MG 24 hr tablet TAKE ONE AND ONE-HALF TABLETS BY MOUTH ONCE DAILY WITH  OR IMMEDIATELY FOLLOWING A MEAL 90 tablet 2   Misc Natural Products (BLACK CHERRY CONCENTRATE PO) Take 1 capsule by mouth daily.     Multiple Vitamins-Minerals (PRESERVISION AREDS 2 PO) Take 1 capsule by mouth in the morning and at bedtime.     omega-3 acid ethyl esters (LOVAZA) 1 g capsule Take 2 capsules (2 g total) by mouth 2 (two) times daily. 360 capsule 2   ondansetron (ZOFRAN) 4 MG tablet Take 1 tablet (4 mg total) by mouth every 8 (eight) hours as needed for nausea or vomiting. 6 tablet 0   OZEMPIC, 2 MG/DOSE, 8 MG/3ML SOPN INJECT 2MG  UNDER THE SKIN EVERY WEEK 3 mL 11   phenazopyridine (PYRIDIUM) 100 MG tablet Take 1 tablet (100 mg total) by  mouth 2 (two) times daily as needed for pain (urinray pain). 20 tablet 0   Polyethyl Glycol-Propyl Glycol (SYSTANE OP) Place 1 drop into both eyes as needed (dry eyes).     tiZANidine (ZANAFLEX) 4 MG tablet TAKE 1 TABLET BY MOUTH EVERY 8 HOURS AS NEEDED FOR MUSCLE SPASM 90 tablet 0   TURMERIC PO Take 1,000 mg by mouth daily.     UNABLE TO FIND Med Name: TENS UNIT DX: M54.2; M54.9 1 Units 0   Vitamin D, Ergocalciferol, (DRISDOL) 1.25 MG (50000 UNIT) CAPS capsule Take 1 capsule (50,000 Units total) by mouth every 7 (seven) days. 12 capsule 0   ezetimibe (ZETIA) 10 MG tablet Take 1 tablet (10 mg total) by mouth daily. 90 tablet 3   No current facility-administered medications on file prior to visit.   Allergies  Allergen Reactions   Lyrica [Pregabalin]     nightmares   Social History   Socioeconomic History   Marital status: Married    Spouse name: Not on file   Number of children: Not on file   Years of education: Not on file   Highest education level: Not on file  Occupational History   Not on file  Tobacco Use   Smoking status: Former    Current packs/day: 0.00    Types: Cigarettes    Quit date: 01/23/1988    Years since quitting: 35.6    Passive exposure: Past   Smokeless tobacco: Never  Vaping Use   Vaping status: Never Used  Substance and Sexual Activity   Alcohol use: No   Drug use: No   Sexual activity: Not on file  Other Topics Concern   Not on file  Social History Narrative   Not on file   Social Drivers of Health   Financial Resource Strain: Low Risk  (04/01/2020)   Overall Financial Resource Strain (CARDIA)    Difficulty of Paying Living Expenses: Not very hard  Food Insecurity: Not on file  Transportation Needs: Not on file  Physical Activity: Not on file  Stress: Not on file  Social Connections: Not on file  Intimate Partner Violence: Not on file   No family history on file.    Review of Systems  Musculoskeletal:  Positive for neck pain.  All  other systems reviewed and are negative.      Objective:   Physical Exam Vitals reviewed.  Constitutional:      General: She is not in acute distress.    Appearance: She is well-developed. She is not diaphoretic.  HENT:     Head: Normocephalic and atraumatic.  Neck:     Thyroid: No thyromegaly.     Vascular: No JVD.     Trachea: No tracheal  deviation.  Cardiovascular:     Rate and Rhythm: Normal rate and regular rhythm.     Heart sounds: Normal heart sounds. No murmur heard.    No friction rub. No gallop.  Pulmonary:     Effort: Pulmonary effort is normal. No respiratory distress.     Breath sounds: Normal breath sounds. No stridor. No wheezing or rales.  Chest:     Chest wall: No tenderness.  Musculoskeletal:     Cervical back: Rigidity present.  Lymphadenopathy:     Cervical: No cervical adenopathy.  Neurological:     Mental Status: She is alert and oriented to person, place, and time.     Cranial Nerves: No cranial nerve deficit.     Motor: No abnormal muscle tone.     Coordination: Coordination normal.     Deep Tendon Reflexes: Reflexes are normal and symmetric.  Psychiatric:        Mood and Affect: Affect is labile and tearful.    Wt Readings from Last 3 Encounters:  09/06/23 225 lb (102.1 kg)  08/12/23 222 lb 4 oz (100.8 kg)  02/12/23 238 lb 15.7 oz (108.4 kg)         Assessment & Plan:  Dysuria - Plan: Urinalysis, Routine w reflex microscopic  Other microscopic hematuria - Plan: Ambulatory referral to Urology Patient had an E. coli UTI which was treated appropriately with Keflex for 7 days and despite this, her repeat urinalysis appears even worse with blood, leukocyte esterase, and nitrite.  I will repeat a culture pain will shift to Cipro 500 mg twice daily for 7 days.  If not improved, would recommend urology consultation for repeat cystoscopy.  Once anatomic abnormalities in the bladder are ruled out, I would recommend topical estrogen to help prevent  recurrent UTIs.  Suspect eustachian tube dysfunction.  Recommended adding Flonase 2 sprays each nostril daily

## 2023-09-06 NOTE — Addendum Note (Signed)
Addended by: Venia Carbon K on: 09/06/2023 04:12 PM   Modules accepted: Orders

## 2023-09-08 LAB — URINE CULTURE
MICRO NUMBER:: 15970326
SPECIMEN QUALITY:: ADEQUATE

## 2023-09-12 ENCOUNTER — Telehealth: Payer: Self-pay

## 2023-09-12 NOTE — Telephone Encounter (Signed)
Pt advised that Ozempic has been approved through [facility] 08/21/2023-08/19/2024. Mjp,lpn

## 2023-09-16 DIAGNOSIS — R8279 Other abnormal findings on microbiological examination of urine: Secondary | ICD-10-CM | POA: Diagnosis not present

## 2023-09-16 DIAGNOSIS — N3021 Other chronic cystitis with hematuria: Secondary | ICD-10-CM | POA: Diagnosis not present

## 2023-09-17 ENCOUNTER — Other Ambulatory Visit: Payer: Medicare Other

## 2023-09-19 ENCOUNTER — Other Ambulatory Visit: Payer: Self-pay | Admitting: Family Medicine

## 2023-09-30 DIAGNOSIS — H0288B Meibomian gland dysfunction left eye, upper and lower eyelids: Secondary | ICD-10-CM | POA: Diagnosis not present

## 2023-09-30 DIAGNOSIS — H2513 Age-related nuclear cataract, bilateral: Secondary | ICD-10-CM | POA: Diagnosis not present

## 2023-09-30 DIAGNOSIS — G4733 Obstructive sleep apnea (adult) (pediatric): Secondary | ICD-10-CM | POA: Diagnosis not present

## 2023-09-30 DIAGNOSIS — H40013 Open angle with borderline findings, low risk, bilateral: Secondary | ICD-10-CM | POA: Diagnosis not present

## 2023-09-30 DIAGNOSIS — H04123 Dry eye syndrome of bilateral lacrimal glands: Secondary | ICD-10-CM | POA: Diagnosis not present

## 2023-09-30 DIAGNOSIS — H16213 Exposure keratoconjunctivitis, bilateral: Secondary | ICD-10-CM | POA: Diagnosis not present

## 2023-09-30 DIAGNOSIS — H18833 Recurrent erosion of cornea, bilateral: Secondary | ICD-10-CM | POA: Diagnosis not present

## 2023-09-30 DIAGNOSIS — H0288A Meibomian gland dysfunction right eye, upper and lower eyelids: Secondary | ICD-10-CM | POA: Diagnosis not present

## 2023-09-30 DIAGNOSIS — H43812 Vitreous degeneration, left eye: Secondary | ICD-10-CM | POA: Diagnosis not present

## 2023-09-30 DIAGNOSIS — H18593 Other hereditary corneal dystrophies, bilateral: Secondary | ICD-10-CM | POA: Diagnosis not present

## 2023-09-30 DIAGNOSIS — H1045 Other chronic allergic conjunctivitis: Secondary | ICD-10-CM | POA: Diagnosis not present

## 2023-11-10 ENCOUNTER — Other Ambulatory Visit: Payer: Self-pay | Admitting: Family Medicine

## 2023-11-12 NOTE — Telephone Encounter (Signed)
 Requested medication (s) are due for refill today: yes  Requested medication (s) are on the active medication list: yes  Last refill:  05/15/23 #90   Future visit scheduled: no  Notes to clinic:  med not delegated to NT to Reorder   Requested Prescriptions  Pending Prescriptions Disp Refills   tiZANidine (ZANAFLEX) 4 MG tablet [Pharmacy Med Name: tiZANidine HCl 4 MG Oral Tablet] 90 tablet     Sig: TAKE 1 TABLET BY MOUTH EVERY 8 HOURS AS NEEDED FOR MUSCLE SPASM     Not Delegated - Cardiovascular:  Alpha-2 Agonists - tizanidine Failed - 11/12/2023  9:37 AM      Failed - This refill cannot be delegated      Failed - Valid encounter within last 6 months    Recent Outpatient Visits           2 years ago General medical exam   [facility] Donita Brooks, MD   2 years ago Dysuria   [facility] Valentino Nose, NP   3 years ago General medical exam   [facility] Tanya Nones, Priscille Heidelberg, MD   4 years ago General medical exam   [facility] Donita Brooks, MD   4 years ago Trigger thumb, right thumb   [facility] Donita Brooks, MD       Future Appointments             In 1 month Lennette Bihari, MD [facility]            Signed Prescriptions Disp Refills   celecoxib (CELEBREX) 200 MG capsule 60 capsule 0    Sig: Take 1 capsule by mouth twice daily     Analgesics:  COX2 Inhibitors Failed - 11/12/2023  9:37 AM      Failed - Manual Review: Labs are only required if the patient has taken medication for more than 8 weeks.      Failed - Valid encounter within last 12 months    Recent Outpatient Visits           2 years ago General medical exam   [facility] Donita Brooks, MD   2 years ago Dysuria   [facility] Valentino Nose, NP   3 years ago General medical exam   [facility] Tanya Nones, Priscille Heidelberg, MD   4 years ago General medical exam   [facility] Donita Brooks, MD   4 years ago Trigger thumb, right thumb   [facility] Pickard, Priscille Heidelberg, MD       Future Appointments             In 1 month Lennette Bihari, MD [facility]            Passed - HGB in normal range and within 360 days    Hemoglobin  Date Value Ref Range Status  08/12/2023 14.9 11.7 - 15.5 g/dL Final         Passed - Cr in normal range and within 360 days    Creat  Date Value Ref Range Status  08/12/2023 0.61 0.60 - 1.00 mg/dL Final   Creatinine, Urine  Date Value Ref Range Status  11/07/2022 118 20 - 275 mg/dL Final         Passed - HCT in normal range  and within 360 days    HCT  Date Value Ref Range Status  08/12/2023 44.6 35.0 - 45.0 % Final         Passed - AST in normal range and within 360 days    AST  Date Value Ref Range Status  08/12/2023 19 10 - 35 U/L Final         Passed - ALT in normal range and within 360 days    ALT  Date Value Ref Range Status  08/12/2023 17 6 - 29 U/L Final         Passed - eGFR is 30 or above and within 360 days    GFR, Est African American  Date Value Ref Range Status  10/27/2020 112 > OR = 60 mL/min/1.24m2 Final   GFR, Est Non African American  Date Value Ref Range Status  10/27/2020 96 > OR = 60 mL/min/1.32m2 Final   GFR, Estimated  Date Value Ref Range Status  02/12/2023 >60 >60 mL/min Final    Comment:    (NOTE) Calculated using the CKD-EPI Creatinine Equation (2021)    GFR  Date Value Ref Range Status  02/13/2021 93.90 >60.00 mL/min Final    Comment:    Calculated using the CKD-EPI Creatinine Equation (2021)   eGFR  Date Value Ref Range Status  08/12/2023 95 > OR = 60 mL/min/1.44m2 Final         Passed - Patient is not pregnant

## 2023-11-12 NOTE — Telephone Encounter (Signed)
 Requested Prescriptions  Pending Prescriptions Disp Refills   celecoxib (CELEBREX) 200 MG capsule [Pharmacy Med Name: Celecoxib 200 MG Oral Capsule] 60 capsule 0    Sig: Take 1 capsule by mouth twice daily     Analgesics:  COX2 Inhibitors Failed - 11/12/2023  9:36 AM      Failed - Manual Review: Labs are only required if the patient has taken medication for more than 8 weeks.      Failed - Valid encounter within last 12 months    Recent Outpatient Visits           2 years ago General medical exam   [facility] Donita Brooks, MD   2 years ago Dysuria   [facility] Valentino Nose, NP   3 years ago General medical exam   [facility] Donita Brooks, MD   4 years ago General medical exam   [facility] Donita Brooks, MD   4 years ago Trigger thumb, right thumb   [facility] Pickard, Priscille Heidelberg, MD       Future Appointments             In 1 month Lennette Bihari, MD [facility] - HGB in normal range and within 360 days    Hemoglobin  Date Value Ref Range Status  08/12/2023 14.9 11.7 - 15.5 g/dL Final         Passed - Cr in normal range and within 360 days    Creat  Date Value Ref Range Status  08/12/2023 0.61 0.60 - 1.00 mg/dL Final   Creatinine, Urine  Date Value Ref Range Status  11/07/2022 118 20 - 275 mg/dL Final         Passed - HCT in normal range and within 360 days    HCT  Date Value Ref Range Status  08/12/2023 44.6 35.0 - 45.0 % Final         Passed - AST in normal range and within 360 days    AST  Date Value Ref Range Status  08/12/2023 19 10 - 35 U/L Final         Passed - ALT in normal range and within 360 days    ALT  Date Value Ref Range Status  08/12/2023 17 6 - 29 U/L Final         Passed - eGFR is 30 or above and within 360 days    GFR, Est African American  Date Value Ref Range Status   10/27/2020 112 > OR = 60 mL/min/1.63m2 Final   GFR, Est Non African American  Date Value Ref Range Status  10/27/2020 96 > OR = 60 mL/min/1.32m2 Final   GFR, Estimated  Date Value Ref Range Status  02/12/2023 >60 >60 mL/min Final    Comment:    (NOTE) Calculated using the CKD-EPI Creatinine Equation (2021)    GFR  Date Value Ref Range Status  02/13/2021 93.90 >60.00 mL/min Final    Comment:    Calculated using the CKD-EPI Creatinine Equation (2021)   eGFR  Date Value Ref Range Status  08/12/2023 95 > OR = 60 mL/min/1.82m2 Final         Passed - Patient is not pregnant       tiZANidine (ZANAFLEX) 4 MG tablet [Pharmacy Med Name: tiZANidine HCl 4 MG Oral  Tablet] 90 tablet     Sig: TAKE 1 TABLET BY MOUTH EVERY 8 HOURS AS NEEDED FOR MUSCLE SPASM     Not Delegated - Cardiovascular:  Alpha-2 Agonists - tizanidine Failed - 11/12/2023  9:36 AM      Failed - This refill cannot be delegated      Failed - Valid encounter within last 6 months    Recent Outpatient Visits           2 years ago General medical exam   [facility] Donita Brooks, MD   2 years ago Dysuria   [facility] Valentino Nose, NP   3 years ago General medical exam   [facility] Donita Brooks, MD   4 years ago General medical exam   [facility] Donita Brooks, MD   4 years ago Trigger thumb, right thumb   [facility] Pickard, Priscille Heidelberg, MD       Future Appointments             In 1 month Tresa Endo Clovis Pu, MD [facility]

## 2023-12-16 ENCOUNTER — Telehealth: Payer: Self-pay | Admitting: Cardiovascular Disease

## 2023-12-16 ENCOUNTER — Other Ambulatory Visit (HOSPITAL_COMMUNITY): Payer: Self-pay | Admitting: Family Medicine

## 2023-12-16 DIAGNOSIS — N3021 Other chronic cystitis with hematuria: Secondary | ICD-10-CM | POA: Diagnosis not present

## 2023-12-16 DIAGNOSIS — Z1231 Encounter for screening mammogram for malignant neoplasm of breast: Secondary | ICD-10-CM

## 2023-12-16 NOTE — Telephone Encounter (Signed)
 Pt would like to know if blood work would be needed before upcoming appt. Please advise

## 2023-12-16 NOTE — Telephone Encounter (Signed)
 Patient had labs done the end of December with PCP.  She would like to know if she will need labs before her 5/6 appointment.

## 2023-12-19 ENCOUNTER — Other Ambulatory Visit: Payer: Self-pay | Admitting: Cardiovascular Disease

## 2023-12-20 ENCOUNTER — Other Ambulatory Visit: Payer: Self-pay | Admitting: Cardiovascular Disease

## 2023-12-20 ENCOUNTER — Telehealth: Payer: Self-pay | Admitting: Cardiovascular Disease

## 2023-12-20 MED ORDER — METOPROLOL SUCCINATE ER 50 MG PO TB24: ORAL_TABLET | ORAL | 0 refills | Status: DC

## 2023-12-20 NOTE — Telephone Encounter (Signed)
*  STAT* If patient is at the pharmacy, call can be transferred to refill team.   1. Which medications need to be refilled? (please list name of each medication and dose if known) metoprolol  succinate (TOPROL -XL) 50 MG 24 hr tablet  2. Which pharmacy/location (including street and city if local pharmacy) is medication to be sent to? [id number] - [place] [place] - [address]  3. Do they need a 30 day or 90 day supply?  30 day supply  Patient says the pharmacy informed her that they received a prescription for Losar[place], but not Metoprolol . Patient says she is completely out of medication.

## 2023-12-20 NOTE — Telephone Encounter (Signed)
 Pt's medication was sent to pt's pharmacy as requested. Confirmation received.

## 2023-12-23 ENCOUNTER — Ambulatory Visit: Payer: Medicare Other | Admitting: Cardiovascular Disease

## 2023-12-23 DIAGNOSIS — H2513 Age-related nuclear cataract, bilateral: Secondary | ICD-10-CM | POA: Diagnosis not present

## 2023-12-23 DIAGNOSIS — H43812 Vitreous degeneration, left eye: Secondary | ICD-10-CM | POA: Diagnosis not present

## 2023-12-23 DIAGNOSIS — H04123 Dry eye syndrome of bilateral lacrimal glands: Secondary | ICD-10-CM | POA: Diagnosis not present

## 2023-12-24 ENCOUNTER — Ambulatory Visit: Payer: Medicare Other | Attending: Cardiovascular Disease | Admitting: Cardiovascular Disease

## 2023-12-24 ENCOUNTER — Encounter: Payer: Self-pay | Admitting: Cardiovascular Disease

## 2023-12-24 VITALS — BP 142/70 | HR 79 | Ht 66.0 in | Wt 247.4 lb

## 2023-12-24 DIAGNOSIS — G4733 Obstructive sleep apnea (adult) (pediatric): Secondary | ICD-10-CM | POA: Insufficient documentation

## 2023-12-24 DIAGNOSIS — I2510 Atherosclerotic heart disease of native coronary artery without angina pectoris: Secondary | ICD-10-CM | POA: Insufficient documentation

## 2023-12-24 DIAGNOSIS — I10 Essential (primary) hypertension: Secondary | ICD-10-CM | POA: Insufficient documentation

## 2023-12-24 DIAGNOSIS — E66812 Obesity, class 2: Secondary | ICD-10-CM | POA: Insufficient documentation

## 2023-12-24 DIAGNOSIS — E782 Mixed hyperlipidemia: Secondary | ICD-10-CM | POA: Insufficient documentation

## 2023-12-24 DIAGNOSIS — I5189 Other ill-defined heart diseases: Secondary | ICD-10-CM | POA: Insufficient documentation

## 2023-12-24 MED ORDER — METOPROLOL SUCCINATE ER 100 MG PO TB24: 100.0000 mg | ORAL_TABLET | Freq: Every day | ORAL | 3 refills | Status: AC

## 2023-12-24 NOTE — Progress Notes (Addendum)
 Cardiology Office Note    Date:  12/24/2023   ID:  Kelli Moss, DOB [birth year], MRN [id number]  PCP:  Kelli Lefort, MD  Cardiologist:  Kelli Schuller, MD    12 month F/U cardiology/sleep evaluation initially referred through the courtesy of Dr. Eliane Moss for evaluation of chest pain.   History of Present Illness:  Kelli Moss is a 73 y.o. female who was initially referred to me through the courtesy of Dr. Cheril Moss for evaluation of grabbing chest discomfort.  She is the sister of my patient Mr. Kelli Moss.  I saw her for my initial evaluation in December 2019 and last saw her on December 18, 2022.  She presents for yearly evaluation.   Kelli Moss has a longstanding history of hypertension for > 20 years and is also felt to have metabolic syndrome.  She has a history of hiatal hernia and celiac disease followed by Dr. Tova Moss as well as a history of pernicious anemia and peripheral neuropathy.  She also has a longstanding history of obstructive sleep apnea and has been on CPAP therapy for over 20 years.  She had been evaluated by Dr. Cheril Moss and during her evaluation she admitted to experiencing episodes of a grabbing-like chest discomfort which is nonexertional but may radiate to her jaws.  She admits to having some anxiety.  She also has had issues with lower extremity edema.  She is extremely sedentary.  She has additional risk factors including a brother with a history of CAD.  She has a remote history of tobacco.  When I initially saw her on July 31, 2018 she had significant tense lower extremity edema to her thighs and was on amlodipine  10 mg and losartan  100 mg for hypertension.  I recommended she decrease amlodipine  to 5 mg and with a resting pulse of 95 added beta-blocker therapy with Toprol -XL 25 mg for 2 weeks with titration up to 50 mg.  I also added HCTZ 12.5 mg to help with her edema.  I recommended she undergo a 2D echo Doppler study as well as a coronary CTA to assess  for subclinical atherosclerosis.  She had a significant history of mixed hyperlipidemia and laboratory in October 2019 showed triglycerides of 350, and VLDL of 31.  I recommended the addition of Vascepa  2 capsules twice a day I discussed the importance of weight loss.  She was continuing to use CPAP and is followed by [facility].  She was evaluated by Kelli Heath, [name].  Her coronary calcium  score was 23 which placed her in the 62nd percentile for age and sex matched control.  She was found to have a 6 mm right middle lobe nodule and on repeat evaluation in July 2020 this was unchanged.  There was diffuse dilation of her esophagus suggestive of esophagitis and dysmotility disorder.  She tells me that she is in need to have another CT since another lung nodule was found.  I saw her on December 05, 2020 at which time she was not having any anginal symptomatology but admitted to occasional "chest cramping " which seem to be precipitated when she would become upset.  She has a history of morbid obesity and at that time was wondering about initiating a keto diet.  I discussed that this was not optimal for heart health and recommended the Mediterranean diet.   During her evaluation, she admitted to 100% compliance with CPAP use.  I saw her on Dec 19, 2021. Since  her prior evaluation she  had issues with blood pressure elevation.  Currently she has been taking amlodipine  10 mg daily, hydrochlorothiazide  12.5 mg but she states she rarely takes this and has only been taking at most 5 times per month.  She has been on losartan  100 mg daily and metoprolol  50 mg daily.  When she recently saw Dr. Cheril Moss on March 31 for blood pressure was elevated in the 160 systolic range.  She also admits to periods where her heart rate increases.  She wishes that I assume her sleep medicine care.  I was able to obtain a sleep study which was interpreted by Kelli [facility] at [facility] from December 2017.  This revealed  moderate to severe obstructive sleep apnea with an overall AHI of 17 events per hour, RDI 18.3/h NREM AHI was severe at 40/h versus non-REM AHI of 16.7/h.  She brought her machine to the office today.  We were able to obtain a download through her card since her machine set up date was September 15, 2016 with [name] now as her DME company.  A download from November 20, 2021 through Dec 19, 2021 shows 100% usage and average use of 8 hours and 18 minutes.  Her ResMed AirSense 10 AutoSet unit is set at a pressure of 15 cm and AHI 0.5.  She has been using a full facemask, AirTouch F20.  I reviewed her most recent evaluation with Dr. Cheril Moss.  She is not on any therapy for lipid-lowering treatment and apparently although there was suggestion to initiate potential statin this was never done and there is concern of the patient concerning myalgias.  Laboratory from his evaluation in March 2023 shows the LDL of 84 and triglycerides were significantly elevated at 316 despite taking Lovaza  1 capsule twice daily.  Total cholesterol was 155 and HDL DL was low at 33.  Hemoglobin A1c was 6.3 consistent with prediabetes.  There was some discussion of initiating Ozempic  to assist with appetite suppression but this has not been done.  I saw her for a yearly cardiology evaluation and to establish sleep medicine care with me.  During that evaluation, she requested to obtain a new CPAP machine since her machine was over 23 years old and she qualified.  [name] was her DME company.  I provided her with 2 sample masks including an AirTouch 20 fullface mask as well as a ResMed air fit F30i mask.  When I saw her on July 03, 2022 she had 30 pound weight loss since initiating therapy with Ozempic .  She was told by her DME that she now qualifies for a new CPAP machine.  She has had some issues with eye infection in June and more recently in October 2023.  She was concerned that this may be resulting from her CPAP.  She has seen Kelli Moss most  recently and is on tobramycin.  She has been on a hypertensive regimen of amlodipine  10 mg, losartan  100 mg, metoprolol  succinate 75 mg daily and she had self discontinued HCTZ which had been 25 mg.  She also has not been taking her omega-3 fatty acid which was Lovaza  2 capsules twice a day but has continued to take Zetia  10 mg.  She is on Ozempic  now which has been beneficial for weight loss.  Laboratory March 2023 showed total cholesterol 155 HDL 33 LDL 84 but triglycerides remain elevated at 316.  She has continued to use her CPAP which is set at a 15 cm water   pressure and AHI is excellent at 1.6.  There is some mask leak.  She had COVID 2 months ago and is back to normal.  During that evaluation, I recommended that she resume taking Lovaza  2 capsules twice a day.  She also qualified to obtain a new CPAP machine and I placed an order for her to receive a ResMed AirSense 11 CPAP AutoSet unit.  I discussed optimal sleep duration of at least 7 to possibly 9 hours if at all possible.  I last saw her on on December 18, 2022.  She received a new ResMed AirSense 11 CPAP unit on September 11, 2022 with [employer] [facility] as her DME company.  A download was obtained from January 23 through October 10, 2022 which shows excellent compliance.  She was using therapy 10 hours and 10 minutes per night.  At a 15 cm set pressure, AHI was 1.2.  A subsequent download from April 1 through December 18, 2022 continue to show compliance which average sleep duration at 7 hours and 33 minutes.  AHI was 2.9/h. She continued to be on amlodipine  10 mg, losartan  100 mg, metoprolol  tartrate 75 mg for hypertension.  She is on Lovaza  2 capsules twice a day, atorvastatin  10 mg, and Zetia  10 mg for mixed hyperlipidemia.  She takes HCTZ as needed for swelling.  She follows with Dr. Eliane Moss for primary care and last saw him on November 13, 2022.    Since I last saw her, she has felt well.  She admits to a 35 pound weight loss since initiating  Ozempic .  She has been on amlodipine  10 mg, hydrochlorothiazide  12.5 mg as needed for swelling, losartan  100 mg daily, metoprolol  succinate 75 mg in the morning.  She is on atorvastatin  10 mg and Lovaza  2 capsules twice a day.  She is on Ozempic  weekly injection.  She has continued to use CPAP with [name] as her DME company.  A download was obtained from April 1 through April 30 which shows 100% usage days with average use at 9 hours and 13 minutes.  Apparently her device is set at a pressure of 15 cm.  AHI is elevated at 5.4.  She believes she is sleeping well.  She presents for evaluation.  Past Medical History:  Diagnosis Date   Colon polyp    Complication of anesthesia    CTS (carpal tunnel syndrome)    Depression    Diabetes mellitus without complication (HCC)    prediabetes   Dyslipidemia    Gastritis    Hypertension    Neuromuscular disorder (HCC)    peripheral neuropathy    Past Surgical History:  Procedure Laterality Date   COLONOSCOPY WITH PROPOFOL  N/A 02/14/2023   Procedure: COLONOSCOPY WITH PROPOFOL ;  Surgeon: Urban Garden, MD;  Location: [facility];  Service: Gastroenterology;  Laterality: N/A;  8:00am;asa 3   POLYPECTOMY  02/14/2023   Procedure: POLYPECTOMY;  Surgeon: Umberto Ganong, Bearl Limes, MD;  Location: [facility];  Service: Gastroenterology;;    Current Medications: Outpatient Medications Prior to Visit  Medication Sig Dispense Refill   amLODipine  (NORVASC ) 10 MG tablet Take 1 tablet by mouth once daily 90 tablet 3   ammonium lactate (AMLACTIN) 12 % cream Apply 1 Application topically as needed for dry skin.     aspirin 81 MG tablet Take 81 mg by mouth daily.     atorvastatin  (LIPITOR) 10 MG tablet TAKE ONE TABLET BY MOUTH ONE TIME DAILY 90 tablet 3  celecoxib  (CELEBREX ) 200 MG capsule Take 1 capsule by mouth twice daily 60 capsule 0   cephALEXin  (KEFLEX ) 500 MG capsule Take 1 capsule (500 mg total) by mouth 3 (three) times daily. 21 capsule 0    CINNAMON PO Take 2,000 mg by mouth daily.     cyanocobalamin  (VITAMIN B12) 1000 MCG/ML injection INJECT 1 ML INTRAMUSCULARLY ONCE A MONTH 30 mL 0   diazepam  (VALIUM ) 10 MG tablet TAKE 1 TABLET BY MOUTH EVERY 8 HOURS AS NEEDED FOR MUSCLE SPASM 60 tablet 0   Efinaconazole  10 % SOLN As directed for toenail fungus (Patient taking differently: Apply 1 Application topically daily as needed (toenail fungus).) 8 mL 3   Flaxseed, Linseed, (FLAX SEEDS PO) Take 1 capsule by mouth daily.     fluticasone (FLONASE) 50 MCG/ACT nasal spray Place 1 spray into both nostrils daily as needed for allergies.     hydrochlorothiazide  (MICROZIDE ) 12.5 MG capsule Take 1 capsule (12.5 mg total) by mouth as needed. 90 capsule 1   hypromellose (SYSTANE NIGHT) 0.3 % GEL ophthalmic ointment 1 Application as needed for dry eyes.     LORazepam  (ATIVAN ) 1 MG tablet Take 1 tablet (1 mg total) by mouth at bedtime as needed. 90 tablet 0   losartan  (COZAAR ) 100 MG tablet Take 1 tablet (100 mg total) by mouth daily. PLEASE KEEP UPCOMING APPOINTMENT IN ORDER TO RECEIVE FUTURE REFILLS! THANK YOU! 30 tablet 1   Magnesium 500 MG TABS Take 500 mg by mouth daily.     Misc Natural Products (BLACK CHERRY CONCENTRATE PO) Take 1 capsule by mouth daily.     Multiple Vitamins-Minerals (PRESERVISION AREDS 2 PO) Take 1 capsule by mouth in the morning and at bedtime.     nitrofurantoin, macrocrystal-monohydrate, (MACROBID) 100 MG capsule Take 100 mg by mouth 2 (two) times daily.     omega-3 acid ethyl esters (LOVAZA ) 1 g capsule Take 2 capsules (2 g total) by mouth 2 (two) times daily. 360 capsule 2   ondansetron  (ZOFRAN ) 4 MG tablet Take 1 tablet (4 mg total) by mouth every 8 (eight) hours as needed for nausea or vomiting. 6 tablet 0   OZEMPIC , 2 MG/DOSE, 8 MG/3ML SOPN INJECT 2MG  UNDER THE SKIN EVERY WEEK 3 mL 11   phenazopyridine  (PYRIDIUM ) 100 MG tablet Take 1 tablet (100 mg total) by mouth 2 (two) times daily as needed for pain (urinray pain).  20 tablet 0   Polyethyl Glycol-Propyl Glycol (SYSTANE OP) Place 1 drop into both eyes as needed (dry eyes).     sodium chloride (MURO 128) 5 % ophthalmic ointment Place 1 Application into both eyes as needed for eye irritation.     tiZANidine  (ZANAFLEX ) 4 MG tablet TAKE 1 TABLET BY MOUTH EVERY 8 HOURS AS NEEDED FOR MUSCLE SPASM 90 tablet 3   TURMERIC PO Take 1,000 mg by mouth daily.     UNABLE TO FIND Med Name: TENS UNIT DX: M54.2; M54.9 1 Units 0   Vitamin D , Ergocalciferol , (DRISDOL ) 1.25 MG (50000 UNIT) CAPS capsule Take 1 capsule (50,000 Units total) by mouth every 7 (seven) days. 12 capsule 0   metoprolol  succinate (TOPROL -XL) 50 MG 24 hr tablet TAKE 1 & 1/2 (ONE & ONE-HALF) TABLETS BY MOUTH ONCE DAILY WITH OR IMMEDIATELY FOLLOWING A MEAL 45 tablet 0   ezetimibe  (ZETIA ) 10 MG tablet Take 1 tablet (10 mg total) by mouth daily. 90 tablet 3   No facility-administered medications prior to visit.     Allergies:  Lyrica [pregabalin]   Social History   Socioeconomic History   Marital status: Married    Spouse name: Not on file   Number of children: Not on file   Years of education: Not on file   Highest education level: Not on file  Occupational History   Not on file  Tobacco Use   Smoking status: Former    Current packs/day: 0.00    Types: Cigarettes    Quit date: 01/23/1988    Years since quitting: 35.9    Passive exposure: Past   Smokeless tobacco: Never  Vaping Use   Vaping status: Never Used  Substance and Sexual Activity   Alcohol use: No   Drug use: No   Sexual activity: Not on file  Other Topics Concern   Not on file  Social History Narrative   Not on file   Social Drivers of Health   Financial Resource Strain: Low Risk  (04/01/2020)   Overall Financial Resource Strain (CARDIA)    Difficulty of Paying Living Expenses: Not very hard  Food Insecurity: Not on file  Transportation Needs: Not on file  Physical Activity: Not on file  Stress: Not on file  Social  Connections: Not on file     Family History:  The patient's family history is not on file.  Her mother died at age 68 with pancreatic cancer.  Father died at age 28 with bladder cancer.  She has a 28 year old brother who has hypertension and CAD who is my patient Kelli Moss.)  ROS General: Negative; No fevers, chills, or night sweats; obesity HEENT: Negative; No changes in vision or hearing, sinus congestion, difficulty swallowing Pulmonary: Negative; No cough, wheezing, shortness of breath, hemoptysis Cardiovascular: See HPI GI: Negative; No nausea, vomiting, diarrhea, or abdominal pain GU: Negative; No dysuria, hematuria, or difficulty voiding Musculoskeletal: Negative; no myalgias, joint pain, or weakness Hematologic/Oncology: Negative; no easy bruising, bleeding Endocrine: metabolic syndrome Neuro: Negative; no changes in balance, headaches Skin: Negative; No rashes or skin lesions Psychiatric: Negative; No behavioral problems, depression Sleep: OSA on CPAP for >20 years; most recent sleep study 2017 showed moderately severe sleep apnea with severe sleep apnea during REM sleep.  She also had occasional periodic limb movement of sleep.  She is compliant with therapy.  Other comprehensive 14 point system review is negative.   PHYSICAL EXAM:   VS:  BP (!) 142/70 (BP Location: Right Arm, Patient Position: Sitting, Cuff Size: Large)   Pulse 79   Ht 5\' 6"  (1.676 m)   Wt 247 lb 6.4 oz (112.2 kg)   SpO2 94%   BMI 39.93 kg/m     Repeat blood pressure by me was 140/70  Wt Readings from Last 3 Encounters:  12/24/23 247 lb 6.4 oz (112.2 kg)  09/06/23 225 lb (102.1 kg)  08/12/23 222 lb 4 oz (100.8 kg)   General: Alert, oriented, no distress.  Skin: normal turgor, no rashes, warm and dry HEENT: Normocephalic, atraumatic. Pupils equal round and reactive to light; sclera anicteric; extraocular muscles intact;  Nose without nasal septal hypertrophy Mouth/Parynx benign; Mallinpatti  scale 3 Neck: No JVD, no carotid bruits; normal carotid upstroke Lungs: clear to ausculatation and percussion; no wheezing or rales Chest wall: without tenderness to palpitation Heart: PMI not displaced, RRR, s1 s2 normal, 1/6 systolic murmur, no diastolic murmur, no rubs, gallops, thrills, or heaves Abdomen: soft, nontender; no hepatosplenomehaly, BS+; abdominal aorta nontender and not dilated by palpation. Back: no CVA tenderness Pulses 2+ Musculoskeletal: full range  of motion, normal strength, no joint deformities Extremities: no clubbing cyanosis or edema, Homan's sign negative  Neurologic: grossly nonfocal; Cranial nerves grossly wnl Psychologic: Normal mood and affect    Studies/Labs Reviewed:   EKG Interpretation Date/Time:  Tuesday Dec 24 2023 14:01:05 EDT Ventricular Rate:  79 PR Interval:  164 QRS Duration:  152 QT Interval:  426 QTC Calculation: 488 R Axis:   -1  Text Interpretation: Normal sinus rhythm Right bundle branch block Cannot rule out Inferior infarct , age undetermined No previous ECGs available Confirmed by Kelli Moss ([id number]) on 12/24/2023 2:19:39 PM    December 18, 2022 ECG (independently read by me): NSR at 76, RBBB  July 03, 2022 ECG (independently read by me): NSR at 79, RBBB  Dec 19, 2021 ECG (independently read by me): NSR at 79, nonspecific intraventricular block  April 18,2022 ECG (independently read by me): NSR at 81, RBBB, QTc 497, no ectopy  December 2019 ECG (independently read by me): Normal sinus rhythm at 95 bpm.  Right bundle branch block with repolarization changes.  No ectopy.  Recent Labs:    Latest Ref Rng & Units 08/12/2023   12:00 PM 02/12/2023   10:35 AM 11/07/2022    8:03 AM  BMP  Glucose 65 - 99 mg/dL 696  295  97   BUN 7 - 25 mg/dL 13  11  14    Creatinine 0.60 - 1.00 mg/dL 2.84  1.32  4.40   BUN/Creat Ratio 6 - 22 (calc) SEE NOTE:   SEE NOTE:   Sodium 135 - 146 mmol/L 141  138  141   Potassium 3.5 - 5.3 mmol/L 4.0  3.9   4.3   Chloride 98 - 110 mmol/L 106  104  107   CO2 20 - 32 mmol/L 24  25  21    Calcium  8.6 - 10.4 mg/dL 9.2  8.8  9.4         Latest Ref Rng & Units 08/12/2023   12:00 PM 02/12/2023   10:35 AM 11/07/2022    8:03 AM  Hepatic Function  Total Protein 6.1 - 8.1 g/dL 7.5  7.6  7.3   Albumin 3.5 - 5.0 g/dL  4.1    AST 10 - 35 U/L 19  23  15    ALT 6 - 29 U/L 17  21  16    Alk Phosphatase 38 - 126 U/L  65    Total Bilirubin 0.2 - 1.2 mg/dL 0.7  0.8  0.5        Latest Ref Rng & Units 08/12/2023   12:00 PM 11/07/2022    8:03 AM 11/03/2021    8:46 AM  CBC  WBC 3.8 - 10.8 Thousand/uL 7.1  6.8  9.0   Hemoglobin 11.7 - 15.5 g/dL 10.2  72.5  36.6   Hematocrit 35.0 - 45.0 % 44.6  45.3  41.1   Platelets 140 - 400 Thousand/uL 271  282  263    Lab Results  Component Value Date   MCV 86.1 08/12/2023   MCV 85.5 11/07/2022   MCV 84.0 11/03/2021   Lab Results  Component Value Date   TSH 1.62 02/13/2021   Lab Results  Component Value Date   HGBA1C 5.2 08/12/2023     BNP No results found for: "BNP"  ProBNP No results found for: "PROBNP"   Lipid Panel     Component Value Date/Time   CHOL 114 08/12/2023 1200   TRIG 215 (H) 08/12/2023 1200   HDL 32 (L)  08/12/2023 1200   CHOLHDL 3.6 08/12/2023 1200   VLDL 40 02/12/2023 1035   LDLCALC 54 08/12/2023 1200     RADIOLOGY: No results found.   Additional studies/ records that were reviewed today include:  I reviewed the records of Dr. Cheril Moss as well as her recent laboratory.  I have I obtain the results of her most recent sleep study interpreted by Dr. Omar Bibber at [facility] from August 06, 2016.   A download was obtained.     ASSESSMENT:    1. Essential hypertension   2. OSA (obstructive sleep apnea)   3. Mixed hyperlipidemia   4. Coronary artery calcification   5. Grade I diastolic dysfunction   6. Obesity, Class II, BMI 35-39.9, isolated     PLAN:  Kelli Moss is a 73 year old female patient who is  followed by Dr. Cheril Moss and has a longstanding history of hypertension for greater than 20 years as well as obstructive sleep apnea for which she has been on CPAP therapy. She has a longstanding history of morbid obesity and has metabolic syndrome/prediabetes..  She was diagnosed with a hiatal hernia and also has a history of pernicious anemia as well as peripheral neuropathy.  When I initially saw her she had tense lower extremity edema did improve with medication adjustment and diuretic therapy.  An echo Doppler study in December 2019 showed hyperdynamic LV function with EF 65 to 70%, grade 1 diastolic dysfunction, mild left atrial enlargement, and moderate LVH.  She had a coronary calcium  score of 23.  Since her initial evaluation in 2019 I had not seen her in over 2-1/2 years until  April 2022.  At that time, she was back on amlodipine  at 10 mg, and was on metoprolol  succinate 50 mg daily, losartan  100 mg, and HCTZ 12.5 mg.  Over the years she has had medication adjustment for blood pressure control.  Her blood pressure today was mildly elevated systolically at 142/70 initially repeat 140/70.  She has sinus rhythm with right bundle branch block and at times has noted some brief palpitations at night.  She is on amlodipine  10 mg daily, losartan  100 mg, and metoprolol  succinate 75 mg daily.  I have suggested slight increase in metoprolol  succinate to 100 mg in a.m. which should be helpful both for blood pressure control and potential improvement in some of her rare to occasional palpitations.  She is on Ozempic  and has had 35 pound weight loss.  She is on atorvastatin  10 mg for hyperlipidemia in addition to Zetia  10 mg with most recent LDL cholesterol at 54 in August 12, 2023 checked by Dr. Eliane Moss.  She has class II obesity and has been successful with 35 pound weight loss since initiating Ozempic .  I reviewed her CPAP data in detail.  I have recommended we change her CPAP from set pressure mode at 15 cm  to an auto mode with a range of 15 to 20 cm.  She is aware of my retirement in several months.  She will be transition to the sleep care of Dr. Micael Adas.  I will transition her cardiology care to Dr. Randene Bustard who will also be seeing her husband.    Medication Adjustments/Labs and Tests Ordered: Current medicines are reviewed at length with the patient today.  Concerns regarding medicines are outlined above.  Medication changes, Labs and Tests ordered today are listed in the Patient Instructions below. Patient Instructions  Medication Instructions:  START METOPROLOL  SUCCINATE 100 MG DAILY  *If  you need a refill on your cardiac medications before your next appointment, please call your pharmacy*  Lab Work: NO LABS If you have labs (blood work) drawn today and your tests are completely normal, you will receive your results only by: MyChart Message (if you have MyChart) OR A paper copy in the mail If you have any lab test that is abnormal or we need to change your treatment, we will call you to review the results.  Testing/Procedures: NO TESTING  Follow-Up: At [facility], you and your health needs are our priority.  As part of our continuing mission to provide you with exceptional heart care, our providers are all part of one team.  This team includes your primary Cardiologist (physician) and Advanced Practice Providers or APPs (Physician Assistants and Nurse Practitioners) who all work together to provide you with the care you need, when you need it.  Your next appointment:   6-8 month(s)  Provider:   Randene Bustard, MD and then 1 year with Gaylyn Keas, MD    We recommend signing up for the patient portal called "MyChart".  Sign up information is provided on this After Visit Summary.  MyChart is used to connect with patients for Virtual Visits (Telemedicine).  Patients are able to view lab/test results, encounter notes, upcoming appointments, etc.  Non-urgent messages can be  sent to your provider as well.   To learn more about what you can do with MyChart, go to [email or web address].     Signed, Kelli Schuller, MD  12/24/2023 5:16 PM    [facility] [address], [address], [place], [place]  [address] Phone: [phone]

## 2023-12-24 NOTE — Addendum Note (Signed)
 Addended by: [name] A on: 12/24/2023 05:17 PM   Modules accepted: Level of Service

## 2023-12-24 NOTE — Addendum Note (Signed)
 Addended by: Lorry Furber C on: 12/24/2023 04:20 PM   Modules accepted: Orders

## 2023-12-24 NOTE — Progress Notes (Signed)
 Pressure setting changed in [employer] and sent to [name]:  Confirm sending these new settings to device [id number]: Therapy settings Set Therapy mode to AutoSet Set Min Pressure to 15 cmH2O Set Max Pressure to 20 cmH2O Set EPR to Off Set Ramp enable to Off Set Response to Standard

## 2023-12-24 NOTE — Patient Instructions (Addendum)
 Medication Instructions:  START METOPROLOL  SUCCINATE 100 MG DAILY  *If you need a refill on your cardiac medications before your next appointment, please call your pharmacy*  Lab Work: NO LABS If you have labs (blood work) drawn today and your tests are completely normal, you will receive your results only by: MyChart Message (if you have MyChart) OR A paper copy in the mail If you have any lab test that is abnormal or we need to change your treatment, we will call you to review the results.  Testing/Procedures: NO TESTING  Follow-Up: At [facility], you and your health needs are our priority.  As part of our continuing mission to provide you with exceptional heart care, our providers are all part of one team.  This team includes your primary Cardiologist (physician) and Advanced Practice Providers or APPs (Physician Assistants and Nurse Practitioners) who all work together to provide you with the care you need, when you need it.  Your next appointment:   6-8 month(s)  Provider:   Randene Bustard, MD and then 1 year with Gaylyn Keas, MD    We recommend signing up for the patient portal called "MyChart".  Sign up information is provided on this After Visit Summary.  MyChart is used to connect with patients for Virtual Visits (Telemedicine).  Patients are able to view lab/test results, encounter notes, upcoming appointments, etc.  Non-urgent messages can be sent to your provider as well.   To learn more about what you can do with MyChart, go to [email or web address].

## 2023-12-25 ENCOUNTER — Ambulatory Visit (HOSPITAL_COMMUNITY)
Admission: RE | Admit: 2023-12-25 | Discharge: 2023-12-25 | Disposition: A | Discharge status: Home / Self Care | Source: Ambulatory Visit | Attending: Family Medicine | Admitting: Family Medicine

## 2023-12-25 ENCOUNTER — Encounter (HOSPITAL_COMMUNITY): Payer: Self-pay

## 2023-12-25 DIAGNOSIS — Z1231 Encounter for screening mammogram for malignant neoplasm of breast: Secondary | ICD-10-CM | POA: Diagnosis not present

## 2024-01-23 ENCOUNTER — Telehealth: Payer: Self-pay

## 2024-01-23 NOTE — Telephone Encounter (Signed)
 Copied from CRM [id number]. Topic: Clinical - Request for Lab/Test Order >> Jan 23, 2024  9:21 AM Zipporah Him wrote: Reason for CRM: Patient is coming in to the office for a visit they just wanted to make sure they weren't supposed to get labs done before the visit with dr pickard. They are not requesting labs, they would like a call to inform them yes or no.

## 2024-01-24 ENCOUNTER — Other Ambulatory Visit

## 2024-01-24 DIAGNOSIS — E8881 Metabolic syndrome: Secondary | ICD-10-CM | POA: Diagnosis not present

## 2024-01-24 DIAGNOSIS — Z0000 Encounter for general adult medical examination without abnormal findings: Secondary | ICD-10-CM

## 2024-01-24 DIAGNOSIS — E119 Type 2 diabetes mellitus without complications: Secondary | ICD-10-CM

## 2024-01-24 DIAGNOSIS — E785 Hyperlipidemia, unspecified: Secondary | ICD-10-CM | POA: Diagnosis not present

## 2024-01-25 LAB — CBC WITH DIFFERENTIAL/PLATELET
Absolute Lymphocytes: 2295 {cells}/uL (ref 850–3900)
Absolute Monocytes: 518 {cells}/uL (ref 200–950)
Basophils Absolute: 60 {cells}/uL (ref 0–200)
Basophils Relative: 0.8 %
Eosinophils Absolute: 158 {cells}/uL (ref 15–500)
Eosinophils Relative: 2.1 %
HCT: 40.7 % (ref 35.0–45.0)
Hemoglobin: 13.4 g/dL (ref 11.7–15.5)
MCH: 29.4 pg (ref 27.0–33.0)
MCHC: 32.9 g/dL (ref 32.0–36.0)
MCV: 89.3 fL (ref 80.0–100.0)
MPV: 10.2 fL (ref 7.5–12.5)
Monocytes Relative: 6.9 %
Neutro Abs: 4470 {cells}/uL (ref 1500–7800)
Neutrophils Relative %: 59.6 %
Platelets: 256 10*3/uL (ref 140–400)
RBC: 4.56 10*6/uL (ref 3.80–5.10)
RDW: 12.7 % (ref 11.0–15.0)
Total Lymphocyte: 30.6 %
WBC: 7.5 10*3/uL (ref 3.8–10.8)

## 2024-01-25 LAB — LIPID PANEL
Cholesterol: 103 mg/dL (ref <200)
HDL: 34 mg/dL — ABNORMAL LOW (ref >=50)
LDL Cholesterol (Calc): 41 mg/dL
Non-HDL Cholesterol (Calc): 69 mg/dL (ref <130)
Total CHOL/HDL Ratio: 3 (calc) (ref <5.0)
Triglycerides: 211 mg/dL — ABNORMAL HIGH (ref <150)

## 2024-01-25 LAB — COMPLETE METABOLIC PANEL WITHOUT GFR
AG Ratio: 1.5 (calc) (ref 1.0–2.5)
ALT: 12 U/L (ref 6–29)
AST: 13 U/L (ref 10–35)
Albumin: 4 g/dL (ref 3.6–5.1)
Alkaline phosphatase (APISO): 70 U/L (ref 37–153)
BUN: 11 mg/dL (ref 7–25)
CO2: 22 mmol/L (ref 20–32)
Calcium: 8.9 mg/dL (ref 8.6–10.4)
Chloride: 106 mmol/L (ref 98–110)
Creat: 0.68 mg/dL (ref 0.60–1.00)
Globulin: 2.6 g/dL (ref 1.9–3.7)
Glucose, Bld: 106 mg/dL — ABNORMAL HIGH (ref 65–99)
Potassium: 4.1 mmol/L (ref 3.5–5.3)
Sodium: 140 mmol/L (ref 135–146)
Total Bilirubin: 0.5 mg/dL (ref 0.2–1.2)
Total Protein: 6.6 g/dL (ref 6.1–8.1)

## 2024-01-25 LAB — VITAMIN B12: Vitamin B-12: 464 pg/mL (ref 200–1100)

## 2024-01-25 LAB — MICROALBUMIN / CREATININE URINE RATIO
Creatinine, Urine: 207 mg/dL (ref 20–275)
Microalb Creat Ratio: 72 mg/g{creat} — ABNORMAL HIGH (ref <30)
Microalb, Ur: 15 mg/dL

## 2024-01-25 LAB — HEMOGLOBIN A1C
Hgb A1c MFr Bld: 5.2 % (ref <5.7)
Mean Plasma Glucose: 103 mg/dL
eAG (mmol/L): 5.7 mmol/L

## 2024-01-27 ENCOUNTER — Telehealth: Payer: Self-pay | Admitting: Cardiovascular Disease

## 2024-01-27 ENCOUNTER — Ambulatory Visit: Payer: Self-pay | Admitting: Family Medicine

## 2024-01-27 ENCOUNTER — Other Ambulatory Visit: Payer: Self-pay | Admitting: Cardiovascular Disease

## 2024-01-27 ENCOUNTER — Other Ambulatory Visit: Payer: Self-pay

## 2024-01-27 MED ORDER — LOSARTAN POTASSIUM 100 MG PO TABS
100.0000 mg | ORAL_TABLET | Freq: Every day | ORAL | 3 refills | Status: DC
Start: 2024-01-27 — End: 2024-07-10

## 2024-01-27 MED ORDER — ATORVASTATIN CALCIUM 10 MG PO TABS: 10.0000 mg | ORAL_TABLET | Freq: Every day | ORAL | 3 refills | Status: DC

## 2024-01-27 NOTE — Telephone Encounter (Signed)
 *  STAT* If patient is at the pharmacy, call can be transferred to refill team.   1. Which medications need to be refilled? (please list name of each medication and dose if known)   atorvastatin  (LIPITOR) 10 MG tablet   losartan  (COZAAR ) 100 MG tablet   2. Would you like to learn more about the convenience, safety, & potential cost savings by using the [facility]? No      3. Are you open to using the [facility] (Type [facility]. ). no     4. Which pharmacy/location (including street and city if local pharmacy) is medication to be sent to?  [id number] - [place] ([address]), [place] - [address]     5. Do they need a 30 day or 90 day supply? 90 days   Pt said, the pharmacy need to get a renewal for her prescription so she can get 90 days supply at a time

## 2024-01-29 ENCOUNTER — Telehealth: Payer: Self-pay

## 2024-01-29 NOTE — Telephone Encounter (Signed)
 Copied from CRM [id number]. Topic: Appointments - Scheduling Inquiry for Clinic >> Jan 29, 2024  1:23 PM Oddis Bench wrote: Reason for CRM: Received a call from patient health coach she was calling stating that patient was informed that the appt scheduled for 06/12 was a physical instead of a AWV, she informed me that the patient and his wife had been told this and the time and date for the visit. She stated that the patients would like to have a physical scheduled and she is not sure what steps to take in order to achieve this for the patients. She also stated that she was going to keep the appt for AWV set on tomorrow. Attempted to schedule physical but no option. She informed patient will get call back from office.

## 2024-01-30 ENCOUNTER — Ambulatory Visit: Payer: Medicare Other | Admitting: *Deleted

## 2024-01-30 DIAGNOSIS — Z0000 Encounter for general adult medical examination without abnormal findings: Secondary | ICD-10-CM

## 2024-01-30 NOTE — Patient Instructions (Signed)
 Ms. Kelli Moss , Thank you for taking time to come for your Medicare Wellness Visit. I appreciate your ongoing commitment to your health goals. Please review the following plan we discussed and let me know if I can assist you in the future.   Screening recommendations/referrals: Colonoscopy: up to date Mammogram: up to date Bone Density: Education provided Recommended yearly ophthalmology/optometry visit for glaucoma screening and checkup Recommended yearly dental visit for hygiene and checkup  Vaccinations: Influenza vaccine: up to date Pneumococcal vaccine: up to date Tdap vaccine: Education provided Shingles vaccine: up to date      Preventive Care 65 Years and Older, Female Preventive care refers to lifestyle choices and visits with your health care provider that can promote health and wellness. What does preventive care include? A yearly physical exam. This is also called an annual well check. Dental exams once or twice a year. Routine eye exams. Ask your health care provider how often you should have your eyes checked. Personal lifestyle choices, including: Daily care of your teeth and gums. Regular physical activity. Eating a healthy diet. Avoiding tobacco and drug use. Limiting alcohol use. Practicing safe sex. Taking low-dose aspirin every day. Taking vitamin and mineral supplements as recommended by your health care provider. What happens during an annual well check? The services and screenings done by your health care provider during your annual well check will depend on your age, overall health, lifestyle risk factors, and family history of disease. Counseling  Your health care provider may ask you questions about your: Alcohol use. Tobacco use. Drug use. Emotional well-being. Home and relationship well-being. Sexual activity. Eating habits. History of falls. Memory and ability to understand (cognition). Work and work Astronomer. Reproductive health. Screening   You may have the following tests or measurements: Height, weight, and BMI. Blood pressure. Lipid and cholesterol levels. These may be checked every 5 years, or more frequently if you are over 70 years old. Skin check. Lung cancer screening. You may have this screening every year starting at age 83 if you have a 30-pack-year history of smoking and currently smoke or have quit within the past 15 years. Fecal occult blood test (FOBT) of the stool. You may have this test every year starting at age 15. Flexible sigmoidoscopy or colonoscopy. You may have a sigmoidoscopy every 5 years or a colonoscopy every 10 years starting at age 8. Hepatitis C blood test. Hepatitis B blood test. Sexually transmitted disease (STD) testing. Diabetes screening. This is done by checking your blood sugar (glucose) after you have not eaten for a while (fasting). You may have this done every 1-3 years. Bone density scan. This is done to screen for osteoporosis. You may have this done starting at age 22. Mammogram. This may be done every 1-2 years. Talk to your health care provider about how often you should have regular mammograms. Talk with your health care provider about your test results, treatment options, and if necessary, the need for more tests. Vaccines  Your health care provider may recommend certain vaccines, such as: Influenza vaccine. This is recommended every year. Tetanus, diphtheria, and acellular pertussis (Tdap, Td) vaccine. You may need a Td booster every 10 years. Zoster vaccine. You may need this after age 53. Pneumococcal 13-valent conjugate (PCV13) vaccine. One dose is recommended after age 85. Pneumococcal polysaccharide (PPSV23) vaccine. One dose is recommended after age 26. Talk to your health care provider about which screenings and vaccines you need and how often you need them. This information is  not intended to replace advice given to you by your health care provider. Make sure you discuss  any questions you have with your health care provider. Document Released: 09/02/2015 Document Revised: 04/25/2016 Document Reviewed: 06/07/2015 Elsevier Interactive Patient Education  2017 [employer].  Fall Prevention in the Home Falls can cause injuries. They can happen to people of all ages. There are many things you can do to make your home safe and to help prevent falls. What can I do on the outside of my home? Regularly fix the edges of walkways and driveways and fix any cracks. Remove anything that might make you trip as you walk through a door, such as a raised step or threshold. Trim any bushes or trees on the path to your home. Use bright outdoor lighting. Clear any walking paths of anything that might make someone trip, such as rocks or tools. Regularly check to see if handrails are loose or broken. Make sure that both sides of any steps have handrails. Any raised decks and porches should have guardrails on the edges. Have any leaves, snow, or ice cleared regularly. Use sand or salt on walking paths during winter. Clean up any spills in your garage right away. This includes oil or grease spills. What can I do in the bathroom? Use night lights. Install grab bars by the toilet and in the tub and shower. Do not use towel bars as grab bars. Use non-skid mats or decals in the tub or shower. If you need to sit down in the shower, use a plastic, non-slip stool. Keep the floor dry. Clean up any water  that spills on the floor as soon as it happens. Remove soap buildup in the tub or shower regularly. Attach bath mats securely with double-sided non-slip rug tape. Do not have throw rugs and other things on the floor that can make you trip. What can I do in the bedroom? Use night lights. Make sure that you have a light by your bed that is easy to reach. Do not use any sheets or blankets that are too big for your bed. They should not hang down onto the floor. Have a firm chair that has  side arms. You can use this for support while you get dressed. Do not have throw rugs and other things on the floor that can make you trip. What can I do in the kitchen? Clean up any spills right away. Avoid walking on wet floors. Keep items that you use a lot in easy-to-reach places. If you need to reach something above you, use a strong step stool that has a grab bar. Keep electrical cords out of the way. Do not use floor polish or wax that makes floors slippery. If you must use wax, use non-skid floor wax. Do not have throw rugs and other things on the floor that can make you trip. What can I do with my stairs? Do not leave any items on the stairs. Make sure that there are handrails on both sides of the stairs and use them. Fix handrails that are broken or loose. Make sure that handrails are as long as the stairways. Check any carpeting to make sure that it is firmly attached to the stairs. Fix any carpet that is loose or worn. Avoid having throw rugs at the top or bottom of the stairs. If you do have throw rugs, attach them to the floor with carpet tape. Make sure that you have a light switch at the top of the  stairs and the bottom of the stairs. If you do not have them, ask someone to add them for you. What else can I do to help prevent falls? Wear shoes that: Do not have high heels. Have rubber bottoms. Are comfortable and fit you well. Are closed at the toe. Do not wear sandals. If you use a stepladder: Make sure that it is fully opened. Do not climb a closed stepladder. Make sure that both sides of the stepladder are locked into place. Ask someone to hold it for you, if possible. Clearly mark and make sure that you can see: Any grab bars or handrails. First and last steps. Where the edge of each step is. Use tools that help you move around (mobility aids) if they are needed. These include: Canes. Walkers. Scooters. Crutches. Turn on the lights when you go into a dark area.  Replace any light bulbs as soon as they burn out. Set up your furniture so you have a clear path. Avoid moving your furniture around. If any of your floors are uneven, fix them. If there are any pets around you, be aware of where they are. Review your medicines with your doctor. Some medicines can make you feel dizzy. This can increase your chance of falling. Ask your doctor what other things that you can do to help prevent falls. This information is not intended to replace advice given to you by your health care provider. Make sure you discuss any questions you have with your health care provider. Document Released: 06/02/2009 Document Revised: 01/12/2016 Document Reviewed: 09/10/2014 Elsevier Interactive Patient Education  2017 [employer].

## 2024-01-30 NOTE — Progress Notes (Signed)
 Subjective:   Kelli Moss is a 73 y.o. female who presents for Medicare Annual (Subsequent) preventive examination.  Visit Complete: Virtual I connected with  Gaelyn S Baldree on 01/30/24 by a audio enabled telemedicine application and verified that I am speaking with the correct person using two identifiers.  Patient Location: Home  Provider Location: Home Office  I discussed the limitations of evaluation and management by telemedicine. The patient expressed understanding and agreed to proceed.  Vital Signs: Because this visit was a virtual/telehealth visit, some criteria may be missing or patient reported. Any vitals not documented were not able to be obtained and vitals that have been documented are patient reported.  Cardiac Risk Factors include: advanced age (>48men, >12 women);hypertension     Objective:    There were no vitals filed for this visit. There is no height or weight on file to calculate BMI.     01/30/2024    4:21 PM 02/12/2023   10:07 AM 11/07/2021    9:56 AM 11/02/2020    9:42 AM 10/29/2019   10:57 AM 06/07/2015    9:22 AM 02/11/2015    9:34 AM  Advanced Directives  Does Patient Have a Medical Advance Directive? Yes Yes  Yes Yes Yes  Yes   Type of Estate agent of [employer] Power of [place];Living will Living will   Healthcare Power of [place];Living will  Healthcare Power of [place];Living will   Does patient want to make changes to medical advance directive?    No - Patient declined No - Patient declined No - Patient declined  No - Patient declined   Copy of Healthcare Power of Attorney in Chart? Yes - validated most recent copy scanned in chart (See row information) No - copy requested    No - copy requested  No - copy requested      Data saved with a previous flowsheet row definition    Current Medications (verified) Outpatient Encounter Medications as of 01/30/2024  Medication Sig   amLODipine  (NORVASC ) 10 MG tablet  Take 1 tablet by mouth once daily   ammonium lactate (AMLACTIN) 12 % cream Apply 1 Application topically as needed for dry skin.   aspirin 81 MG tablet Take 81 mg by mouth daily.   atorvastatin  (LIPITOR) 10 MG tablet Take 1 tablet (10 mg total) by mouth daily.   celecoxib  (CELEBREX ) 200 MG capsule Take 1 capsule by mouth twice daily   cyanocobalamin  (VITAMIN B12) 1000 MCG/ML injection INJECT 1 ML INTRAMUSCULARLY ONCE A MONTH   diazepam  (VALIUM ) 10 MG tablet TAKE 1 TABLET BY MOUTH EVERY 8 HOURS AS NEEDED FOR MUSCLE SPASM   Efinaconazole  10 % SOLN As directed for toenail fungus   ezetimibe  (ZETIA ) 10 MG tablet Take 1 tablet (10 mg total) by mouth daily.   fluticasone (FLONASE) 50 MCG/ACT nasal spray Place 1 spray into both nostrils daily as needed for allergies.   hydrochlorothiazide  (MICROZIDE ) 12.5 MG capsule Take 1 capsule (12.5 mg total) by mouth as needed.   hypromellose (SYSTANE NIGHT) 0.3 % GEL ophthalmic ointment 1 Application as needed for dry eyes.   LORazepam  (ATIVAN ) 1 MG tablet Take 1 tablet (1 mg total) by mouth at bedtime as needed.   losartan  (COZAAR ) 100 MG tablet Take 1 tablet (100 mg total) by mouth daily.   Magnesium 500 MG TABS Take 500 mg by mouth daily.   metoprolol  succinate (TOPROL -XL) 100 MG 24 hr tablet Take 1 tablet (100 mg total) by mouth daily.  Take with or immediately following a meal.   Multiple Vitamins-Minerals (PRESERVISION AREDS 2 PO) Take 1 capsule by mouth in the morning and at bedtime.   omega-3 acid ethyl esters (LOVAZA ) 1 g capsule Take 2 capsules (2 g total) by mouth 2 (two) times daily.   ondansetron  (ZOFRAN ) 4 MG tablet Take 1 tablet (4 mg total) by mouth every 8 (eight) hours as needed for nausea or vomiting.   OZEMPIC , 2 MG/DOSE, 8 MG/3ML SOPN INJECT 2MG  UNDER THE SKIN EVERY WEEK   phenazopyridine  (PYRIDIUM ) 100 MG tablet Take 1 tablet (100 mg total) by mouth 2 (two) times daily as needed for pain (urinray pain).   sodium chloride (MURO 128) 5 %  ophthalmic ointment Place 1 Application into both eyes as needed for eye irritation.   tiZANidine  (ZANAFLEX ) 4 MG tablet TAKE 1 TABLET BY MOUTH EVERY 8 HOURS AS NEEDED FOR MUSCLE SPASM   UNABLE TO FIND Med Name: TENS UNIT DX: M54.2; M54.9   UNABLE TO FIND Hyperbotic for womens urinary health   Vitamin D , Ergocalciferol , (DRISDOL ) 1.25 MG (50000 UNIT) CAPS capsule Take 1 capsule (50,000 Units total) by mouth every 7 (seven) days.   cephALEXin  (KEFLEX ) 500 MG capsule Take 1 capsule (500 mg total) by mouth 3 (three) times daily.   CINNAMON PO Take 2,000 mg by mouth daily.   Flaxseed, Linseed, (FLAX SEEDS PO) Take 1 capsule by mouth daily.   Misc Natural Products (BLACK CHERRY CONCENTRATE PO) Take 1 capsule by mouth daily.   nitrofurantoin, macrocrystal-monohydrate, (MACROBID) 100 MG capsule Take 100 mg by mouth 2 (two) times daily.   Polyethyl Glycol-Propyl Glycol (SYSTANE OP) Place 1 drop into both eyes as needed (dry eyes).   TURMERIC PO Take 1,000 mg by mouth daily.   No facility-administered encounter medications on file as of 01/30/2024.    Allergies (verified) Lyrica [pregabalin]   History: Past Medical History:  Diagnosis Date   Colon polyp    Complication of anesthesia    CTS (carpal tunnel syndrome)    Depression    Diabetes mellitus without complication (HCC)    prediabetes   Dyslipidemia    Gastritis    Hypertension    Neuromuscular disorder (HCC)    peripheral neuropathy   Past Surgical History:  Procedure Laterality Date   COLONOSCOPY WITH PROPOFOL  N/A 02/14/2023   Procedure: COLONOSCOPY WITH PROPOFOL ;  Surgeon: Urban Garden, MD;  Location[facility];  Service: Gastroenterology;  Laterality: N/A;  8:00am;asa 3   POLYPECTOMY  02/14/2023   Procedure: POLYPECTOMY;  Surgeon: Urban Garden, MD;  Location: [facility];  Service: Gastroenterology;;   History reviewed. No pertinent family history. Social History   Socioeconomic History    Marital status: Married    Spouse name: Not on file   Number of children: Not on file   Years of education: Not on file   Highest education level: Not on file  Occupational History   Not on file  Tobacco Use   Smoking status: Former    Current packs/day: 0.00    Types: Cigarettes    Quit date: 01/23/1988    Years since quitting: 36.0    Passive exposure: Past   Smokeless tobacco: Never  Vaping Use   Vaping status: Never Used  Substance and Sexual Activity   Alcohol use: No   Drug use: No   Sexual activity: Not Currently  Other Topics Concern   Not on file  Social History Narrative   Not on file  Social Drivers of Corporate investment banker Strain: Low Risk  (01/30/2024)   Overall Financial Resource Strain (CARDIA)    Difficulty of Paying Living Expenses: Not hard at all  Food Insecurity: No Food Insecurity (01/30/2024)   Hunger Vital Sign    Worried About Running Out of Food in the Last Year: Never true    Ran Out of Food in the Last Year: Never true  Transportation Needs: No Transportation Needs (01/30/2024)   PRAPARE - Administrator, Civil Service (Medical): No    Lack of Transportation (Non-Medical): No  Physical Activity: Inactive (01/30/2024)   Exercise Vital Sign    Days of Exercise per Week: 0 days    Minutes of Exercise per Session: 0 min  Stress: No Stress Concern Present (01/30/2024)   [employer] of Occupational Health - Occupational Stress Questionnaire    Feeling of Stress: Only a little  Social Connections: Moderately Integrated (01/30/2024)   Social Connection and Isolation Panel    Frequency of Communication with Friends and Family: Twice a week    Frequency of Social Gatherings with Friends and Family: Twice a week    Attends Religious Services: More than 4 times per year    Active Member of [employer] or Organizations: No    Attends Engineer, structural: Never    Marital Status: Married    Tobacco Counseling Counseling given:  Not Answered   Clinical Intake:  Pre-visit preparation completed: No  Pain : No/denies pain     Diabetes: No  How often do you need to have someone help you when you read instructions, pamphlets, or other written materials from your doctor or pharmacy?: 1 - Never  Interpreter Needed?: No  Information entered by :: Kieth Pelt LPN   Activities of Daily Living    01/30/2024    3:53 PM 02/12/2023   10:27 AM  In your present state of health, do you have any difficulty performing the following activities:  Hearing? 0   Vision? 0   Difficulty concentrating or making decisions? 1   Walking or climbing stairs? 0   Dressing or bathing? 0   Doing errands, shopping? 0 0  Preparing Food and eating ? N   Using the Toilet? N   In the past six months, have you accidently leaked urine? Y   Do you have problems with loss of bowel control? N   Managing your Medications? N   Managing your Finances? N   Housekeeping or managing your Housekeeping? N     Patient Care Team: [name], MD as PCP - General (Family Medicine) Millicent Ally, MD as PCP - Cardiology (Cardiology) Myrle Aspen, [facility] (Inactive) as Pharmacist (Pharmacist) Maris Sickle, MD as Referring Physician (Ophthalmology)  Indicate any recent Medical Services you may have received from other than [facility] providers in the past year (date may be approximate).     Assessment:   This is a routine wellness examination for [place].  Hearing/Vision screen Hearing Screening - Comments:: No trouble hearing Vision Screening - Comments:: Up to date [facility]   Goals Addressed             This Visit's Progress    Patient Stated       Feel Better       Depression Screen    01/30/2024    3:56 PM 09/06/2023    4:11 PM 06/18/2022   12:20 PM 11/02/2020    9:40 AM 10/29/2019   10:56  AM 06/12/2018   10:37 AM 06/12/2018    8:34 AM  PHQ 2/9 Scores  PHQ - 2 Score 5 0 1 0 0  0  PHQ- 9 Score 13  10       Exception Documentation      Medical reason     Fall Risk    01/30/2024    4:20 PM 09/06/2023    4:11 PM 06/18/2022   12:10 PM 11/02/2020    9:40 AM 10/29/2019   10:56 AM  Fall Risk   Falls in the past year? 0 0 0 0 0   Number falls in past yr: 0 0     Injury with Fall? 0 0     Risk for fall due to :  No Fall Risks  No Fall Risks No Fall Risks  Follow up Falls evaluation completed;Education provided;Falls prevention discussed Falls prevention discussed  Falls evaluation completed  Falls evaluation completed      Data saved with a previous flowsheet row definition    MEDICARE RISK AT HOME: Medicare Risk at Home Any stairs in or around the home?: Yes If so, are there any without handrails?: No Home free of loose throw rugs in walkways, pet beds, electrical cords, etc?: Yes Adequate lighting in your home to reduce risk of falls?: Yes Life alert?: No Use of a cane, walker or w/c?: No Grab bars in the bathroom?: Yes Shower chair or bench in shower?: Yes Elevated toilet seat or a handicapped toilet?: Yes  TIMED UP AND GO:  Was the test performed?  No    Cognitive Function:        01/30/2024    3:52 PM  6CIT Screen  What Year? 0 points  What month? 0 points  What time? 0 points  Count back from 20 0 points  Months in reverse 0 points  Repeat phrase 2 points  Total Score 2 points    Immunizations Immunization History  Administered Date(s) Administered   Fluad Quad(high Dose 65+) 06/11/2019, 07/11/2020   Influenza, High Dose Seasonal PF 06/12/2018   Influenza,inj,Quad PF,6+ Mos 05/10/2014   PFIZER(Purple Top)SARS-COV-2 Vaccination 09/15/2019, 10/06/2019, 06/14/2020   Pneumococcal Conjugate-13 06/12/2018   Pneumococcal Polysaccharide-23 12/31/2016   Tdap 03/31/2009   Zoster Recombinant(Shingrix) 04/12/2022   Zoster, Live 05/30/2011    TDAP status: Due, Education has been provided regarding the importance of this vaccine. Advised may receive this vaccine at local  pharmacy or Health Dept. Aware to provide a copy of the vaccination record if obtained from local pharmacy or Health Dept. Verbalized acceptance and understanding.  Flu Vaccine status: Up to date  Pneumococcal vaccine status: Up to date  Covid-19 vaccine status: Information provided on how to obtain vaccines.   Qualifies for Shingles Vaccine? No   Zostavax completed Yes   Shingrix Completed?: Yes  Screening Tests Health Maintenance  Topic Date Due   Zoster Vaccines- Shingrix (2 of 2) 06/07/2022   FOOT EXAM  11/01/2022   COVID-19 Vaccine (4 - 2024-25 season) 04/21/2023   OPHTHALMOLOGY EXAM  09/18/2023   DTaP/Tdap/Td (2 - Td or Tdap) 09/05/2024 (Originally 04/01/2019)   DEXA SCAN  09/05/2024 (Originally 01/01/2016)   INFLUENZA VACCINE  03/20/2024   HEMOGLOBIN A1C  07/25/2024   Diabetic kidney evaluation - eGFR measurement  01/23/2025   Diabetic kidney evaluation - Urine ACR  01/23/2025   Medicare Annual Wellness (AWV)  01/29/2025   MAMMOGRAM  12/24/2025   Colonoscopy  02/14/2028   Pneumococcal Vaccine: 50+ Years  Completed  Hepatitis C Screening  Completed   HPV VACCINES  Aged Out   Meningococcal B Vaccine  Aged Out    Health Maintenance  Health Maintenance Due  Topic Date Due   Zoster Vaccines- Shingrix (2 of 2) 06/07/2022   FOOT EXAM  11/01/2022   COVID-19 Vaccine (4 - 2024-25 season) 04/21/2023   OPHTHALMOLOGY EXAM  09/18/2023    Colorectal cancer screening: Type of screening: Colonoscopy. Completed 2024. Repeat every 5 years  Mammogram status: Completed  . Repeat every year  Bone Density  Education provided   Lung Cancer Screening: (Low Dose CT Chest recommended if Age 26-80 years, 20 pack-year currently smoking OR have quit w/in 15years.) does not qualify.   Lung Cancer Screening Referral:   Additional Screening:  Hepatitis C Screening: does not qualify; Completed 2017  Vision Screening: Recommended annual ophthalmology exams for early detection of  glaucoma and other disorders of the eye. Is the patient up to date with their annual eye exam?  Yes  Who is the provider or what is the name of the office in which the patient attends annual eye exams? [facility] If pt is not established with a provider, would they like to be referred to a provider to establish care? No .   Dental Screening: Recommended annual dental exams for proper oral hygiene    Community Resource Referral / Chronic Care Management: CRR required this visit?  No   CCM required this visit?  No     Plan:     I have personally reviewed and noted the following in the patient's chart:   Medical and social history Use of alcohol, tobacco or illicit drugs  Current medications and supplements including opioid prescriptions. Patient is not currently taking opioid prescriptions. Functional ability and status Nutritional status Physical activity Advanced directives List of other physicians Hospitalizations, surgeries, and ER visits in previous 12 months Vitals Screenings to include cognitive, depression, and falls Referrals and appointments  In addition, I have reviewed and discussed with patient certain preventive protocols, quality metrics, and best practice recommendations. A written personalized care plan for preventive services as well as general preventive health recommendations were provided to patient.     Kieth Pelt, LPN   1/61/0960   After Visit Summary: (MyChart) Due to this being a telephonic visit, the after visit summary with patients personalized plan was offered to patient via MyChart   Nurse Notes:

## 2024-02-11 ENCOUNTER — Telehealth: Payer: Self-pay

## 2024-02-11 ENCOUNTER — Telehealth: Payer: Self-pay | Admitting: Family Medicine

## 2024-02-11 NOTE — Telephone Encounter (Signed)
 Copied from CRM [id number]. Topic: Appointments - Scheduling Inquiry for Clinic >> Feb 11, 2024  9:12 AM Rosaria BRAVO wrote: Reason for CRM: Pt called requesting to speak to Mliss Graff regarding a physical with Dr. Duanne, has questions for her.   Best contact: [phone]

## 2024-02-11 NOTE — Telephone Encounter (Signed)
 Copied from CRM [id number]. Topic: General - Call Back - No Documentation >> Feb 11, 2024  8:48 AM Silvana PARAS wrote: Reason for CRM: Pt calling to verify details with Jestine Mliss HERO, LPN. Relayed appt details that Medicare wellness visit is scheduled for 02/04/2025, but pt has additional questions. Called clinic and was on hold. Pt disconnected call before clinic could get back on the line. Clinic stated LPN doesn't have a callback number, so I advised provider or Dr. Clara nursing team give pt a callback at [phone] or [phone].

## 2024-02-12 ENCOUNTER — Telehealth: Payer: Self-pay | Admitting: *Deleted

## 2024-02-12 NOTE — Telephone Encounter (Signed)
 Left a detailed message with patient that they will have to call the [facility] line to schedule the physicals for her and husband per the office.    See Previous notes for any questions regarding schedule issue.

## 2024-02-23 ENCOUNTER — Other Ambulatory Visit: Payer: Self-pay | Admitting: Family Medicine

## 2024-03-02 ENCOUNTER — Other Ambulatory Visit: Payer: Self-pay | Admitting: Cardiovascular Disease

## 2024-03-02 ENCOUNTER — Other Ambulatory Visit: Payer: Self-pay | Admitting: Family Medicine

## 2024-03-13 ENCOUNTER — Ambulatory Visit: Admitting: Family Medicine

## 2024-03-13 ENCOUNTER — Encounter: Payer: Self-pay | Admitting: Family Medicine

## 2024-03-13 VITALS — BP 122/64 | HR 77 | Temp 98.9°F | Ht 66.0 in | Wt 236.0 lb

## 2024-03-13 DIAGNOSIS — I2510 Atherosclerotic heart disease of native coronary artery without angina pectoris: Secondary | ICD-10-CM

## 2024-03-13 DIAGNOSIS — M5030 Other cervical disc degeneration, unspecified cervical region: Secondary | ICD-10-CM

## 2024-03-13 DIAGNOSIS — Z0000 Encounter for general adult medical examination without abnormal findings: Secondary | ICD-10-CM

## 2024-03-13 DIAGNOSIS — E119 Type 2 diabetes mellitus without complications: Secondary | ICD-10-CM | POA: Diagnosis not present

## 2024-03-13 DIAGNOSIS — E785 Hyperlipidemia, unspecified: Secondary | ICD-10-CM | POA: Diagnosis not present

## 2024-03-13 DIAGNOSIS — R300 Dysuria: Secondary | ICD-10-CM | POA: Diagnosis not present

## 2024-03-13 DIAGNOSIS — R1310 Dysphagia, unspecified: Secondary | ICD-10-CM | POA: Insufficient documentation

## 2024-03-13 DIAGNOSIS — E8881 Metabolic syndrome: Secondary | ICD-10-CM

## 2024-03-13 LAB — URINALYSIS, ROUTINE W REFLEX MICROSCOPIC
Bilirubin Urine: NEGATIVE
Glucose, UA: NEGATIVE
Hyaline Cast: NONE SEEN /LPF
Ketones, ur: NEGATIVE
Nitrite: NEGATIVE
Specific Gravity, Urine: 1.02 (ref 1.001–1.035)
pH: 6 (ref 5.0–8.0)

## 2024-03-13 LAB — MICROSCOPIC MESSAGE

## 2024-03-13 MED ORDER — SULFAMETHOXAZOLE-TRIMETHOPRIM 800-160 MG PO TABS: 1.0000 | ORAL_TABLET | Freq: Two times a day (BID) | ORAL | 0 refills | Status: AC

## 2024-03-13 NOTE — Progress Notes (Signed)
 Subjective:    Patient ID: Kelli Moss, female    DOB: 08-20-1951, 73 y.o.   MRN: [id number]  HPI    Patient is here today for complete physical exam.  Mammogram 5/25 was normal.  Colonoscopy in 2024 showed 3 polyps.  Biopsy showed tubular adenomas.  Repeat colonoscopy was recommended in 5 years.  Patient had a CT scan to monitor lung nodule in 2022.  The nodule was stable and felt to be benign.  Patient was found to have a 30% blockage in her LAD on the CT scan in 2020.  We are managing this medically.  Overall she is doing well.  She did not see significant benefit from the Celebrex  with regards to pain in her neck.  Therefore she discontinued the Celebrex .  She appears to be due for the RSV vaccine as well as a tetanus shot.  We also discussed Capvaxive. Lab on 01/24/2024  Component Date Value Ref Range Status   WBC 01/24/2024 7.5  3.8 - 10.8 Thousand/uL Final   RBC 01/24/2024 4.56  3.80 - 5.10 Million/uL Final   Hemoglobin 01/24/2024 13.4  11.7 - 15.5 g/dL Final   HCT 93/93/7974 40.7  35.0 - 45.0 % Final   MCV 01/24/2024 89.3  80.0 - 100.0 fL Final   MCH 01/24/2024 29.4  27.0 - 33.0 pg Final   MCHC 01/24/2024 32.9  32.0 - 36.0 g/dL Final   Comment: For adults, a slight decrease in the calculated MCHC value (in the range of 30 to 32 g/dL) is most likely not clinically significant; however, it should be interpreted with caution in correlation with other red cell parameters and the patient's clinical condition.    RDW 01/24/2024 12.7  11.0 - 15.0 % Final   Platelets 01/24/2024 256  140 - 400 Thousand/uL Final   MPV 01/24/2024 10.2  7.5 - 12.5 fL Final   Neutro Abs 01/24/2024 4,470  1,500 - 7,800 cells/uL Final   Absolute Lymphocytes 01/24/2024 2,295  850 - 3,900 cells/uL Final   Absolute Monocytes 01/24/2024 518  200 - 950 cells/uL Final   Eosinophils Absolute 01/24/2024 158  15 - 500 cells/uL Final   Basophils Absolute 01/24/2024 60  0 - 200 cells/uL Final   Neutrophils  Relative % 01/24/2024 59.6  % Final   Total Lymphocyte 01/24/2024 30.6  % Final   Monocytes Relative 01/24/2024 6.9  % Final   Eosinophils Relative 01/24/2024 2.1  % Final   Basophils Relative 01/24/2024 0.8  % Final   Glucose, Bld 01/24/2024 106 (H)  65 - 99 mg/dL Final   Comment: .            Fasting reference interval . For someone without known diabetes, a glucose value between 100 and 125 mg/dL is consistent with prediabetes and should be confirmed with a follow-up test. .    BUN 01/24/2024 11  7 - 25 mg/dL Final   Creat 93/93/7974 0.68  0.60 - 1.00 mg/dL Final   BUN/Creatinine Ratio 01/24/2024 SEE NOTE:  6 - 22 (calc) Final   Comment:    Not Reported: BUN and Creatinine are within    reference range. .    Sodium 01/24/2024 140  135 - 146 mmol/L Final   Potassium 01/24/2024 4.1  3.5 - 5.3 mmol/L Final   Chloride 01/24/2024 106  98 - 110 mmol/L Final   CO2 01/24/2024 22  20 - 32 mmol/L Final   Calcium  01/24/2024 8.9  8.6 - 10.4 mg/dL Final  Total Protein 01/24/2024 6.6  6.1 - 8.1 g/dL Final   Albumin 93/93/7974 4.0  3.6 - 5.1 g/dL Final   Globulin 93/93/7974 2.6  1.9 - 3.7 g/dL (calc) Final   AG Ratio 01/24/2024 1.5  1.0 - 2.5 (calc) Final   Total Bilirubin 01/24/2024 0.5  0.2 - 1.2 mg/dL Final   Alkaline phosphatase (APISO) 01/24/2024 70  37 - 153 U/L Final   AST 01/24/2024 13  10 - 35 U/L Final   ALT 01/24/2024 12  6 - 29 U/L Final   Cholesterol 01/24/2024 103  <200 mg/dL Final   HDL 93/93/7974 34 (L)  > OR = 50 mg/dL Final   Triglycerides 93/93/7974 211 (H)  <150 mg/dL Final   Comment: . If a non-fasting specimen was collected, consider repeat triglyceride testing on a fasting specimen if clinically indicated.  Veatrice et al. J. of Clin. Lipidol. 2015;9:129-169. SABRA    LDL Cholesterol (Calc) 01/24/2024 41  mg/dL (calc) Final   Comment: Reference range: <100 . Desirable range <100 mg/dL for primary prevention;   <70 mg/dL for patients with CHD or diabetic  patients  with > or = 2 CHD risk factors. SABRA LDL-C is now calculated using the Martin-Hopkins  calculation, which is a validated novel method providing  better accuracy than the Friedewald equation in the  estimation of LDL-C.  Gladis APPLETHWAITE et al. SANDREA. 7986;689(80): 2061-2068  (http://education.[facility].com/faq/FAQ164)    Total CHOL/HDL Ratio 01/24/2024 3.0  <4.9 (calc) Final   Non-HDL Cholesterol (Calc) 01/24/2024 69  <130 mg/dL (calc) Final   Comment: For patients with diabetes plus 1 major ASCVD risk  factor, treating to a non-HDL-C goal of <100 mg/dL  (LDL-C of <29 mg/dL) is considered a therapeutic  option.    Hgb A1c MFr Bld 01/24/2024 5.2  <5.7 % Final   Comment: For the purpose of screening for the presence of diabetes: . <5.7%       Consistent with the absence of diabetes 5.7-6.4%    Consistent with increased risk for diabetes             (prediabetes) > or =6.5%  Consistent with diabetes . This assay result is consistent with a decreased risk of diabetes. . Currently, no consensus exists regarding use of hemoglobin A1c for diagnosis of diabetes in children. . According to American Diabetes Association (ADA) guidelines, hemoglobin A1c <7.0% represents optimal control in non-pregnant diabetic patients. Different metrics may apply to specific patient populations.  Standards of Medical Care in Diabetes(ADA). .    Mean Plasma Glucose 01/24/2024 103  mg/dL Final   eAG (mmol/L) 93/93/7974 5.7  mmol/L Final   Creatinine, Urine 01/24/2024 207  20 - 275 mg/dL Final   Microalb, Ur 93/93/7974 15.0  mg/dL Final   Comment: Reference Range Not established    Microalb Creat Ratio 01/24/2024 72 (H)  <30 mg/g creat Final   Comment: . The ADA defines abnormalities in albumin excretion as follows: SABRA Albuminuria Category        Result (mg/g creatinine) . Normal to Mildly increased   <30 Moderately increased         30-299  Severely increased           > OR =  300 . The ADA recommends that at least two of three specimens collected within a 3-6 month period be abnormal before considering a patient to be within a diagnostic category.    Vitamin B-12 01/24/2024 464  200 - 1,100 pg/mL Final  Immunization History  Administered Date(s) Administered   Fluad Quad(high Dose 65+) 06/11/2019, 07/11/2020   Influenza, High Dose Seasonal PF 06/12/2018   Influenza,inj,Quad PF,6+ Mos 05/10/2014   PFIZER(Purple Top)SARS-COV-2 Vaccination 09/15/2019, 10/06/2019, 06/14/2020   Pneumococcal Conjugate-13 06/12/2018   Pneumococcal Polysaccharide-23 12/31/2016   Tdap 03/31/2009   Zoster Recombinant(Shingrix) 04/12/2022   Zoster, Live 05/30/2011    No visits with results within 1 Month(s) from this visit.  Latest known visit with results is:  Lab on 01/24/2024  Component Date Value Ref Range Status   WBC 01/24/2024 7.5  3.8 - 10.8 Thousand/uL Final   RBC 01/24/2024 4.56  3.80 - 5.10 Million/uL Final   Hemoglobin 01/24/2024 13.4  11.7 - 15.5 g/dL Final   HCT 93/93/7974 40.7  35.0 - 45.0 % Final   MCV 01/24/2024 89.3  80.0 - 100.0 fL Final   MCH 01/24/2024 29.4  27.0 - 33.0 pg Final   MCHC 01/24/2024 32.9  32.0 - 36.0 g/dL Final   Comment: For adults, a slight decrease in the calculated MCHC value (in the range of 30 to 32 g/dL) is most likely not clinically significant; however, it should be interpreted with caution in correlation with other red cell parameters and the patient's clinical condition.    RDW 01/24/2024 12.7  11.0 - 15.0 % Final   Platelets 01/24/2024 256  140 - 400 Thousand/uL Final   MPV 01/24/2024 10.2  7.5 - 12.5 fL Final   Neutro Abs 01/24/2024 4,470  1,500 - 7,800 cells/uL Final   Absolute Lymphocytes 01/24/2024 2,295  850 - 3,900 cells/uL Final   Absolute Monocytes 01/24/2024 518  200 - 950 cells/uL Final   Eosinophils Absolute 01/24/2024 158  15 - 500 cells/uL Final   Basophils Absolute 01/24/2024 60  0 - 200 cells/uL Final    Neutrophils Relative % 01/24/2024 59.6  % Final   Total Lymphocyte 01/24/2024 30.6  % Final   Monocytes Relative 01/24/2024 6.9  % Final   Eosinophils Relative 01/24/2024 2.1  % Final   Basophils Relative 01/24/2024 0.8  % Final   Glucose, Bld 01/24/2024 106 (H)  65 - 99 mg/dL Final   Comment: .            Fasting reference interval . For someone without known diabetes, a glucose value between 100 and 125 mg/dL is consistent with prediabetes and should be confirmed with a follow-up test. .    BUN 01/24/2024 11  7 - 25 mg/dL Final   Creat 93/93/7974 0.68  0.60 - 1.00 mg/dL Final   BUN/Creatinine Ratio 01/24/2024 SEE NOTE:  6 - 22 (calc) Final   Comment:    Not Reported: BUN and Creatinine are within    reference range. .    Sodium 01/24/2024 140  135 - 146 mmol/L Final   Potassium 01/24/2024 4.1  3.5 - 5.3 mmol/L Final   Chloride 01/24/2024 106  98 - 110 mmol/L Final   CO2 01/24/2024 22  20 - 32 mmol/L Final   Calcium  01/24/2024 8.9  8.6 - 10.4 mg/dL Final   Total Protein 93/93/7974 6.6  6.1 - 8.1 g/dL Final   Albumin 93/93/7974 4.0  3.6 - 5.1 g/dL Final   Globulin 93/93/7974 2.6  1.9 - 3.7 g/dL (calc) Final   AG Ratio 01/24/2024 1.5  1.0 - 2.5 (calc) Final   Total Bilirubin 01/24/2024 0.5  0.2 - 1.2 mg/dL Final   Alkaline phosphatase (APISO) 01/24/2024 70  37 - 153 U/L Final   AST 01/24/2024  13  10 - 35 U/L Final   ALT 01/24/2024 12  6 - 29 U/L Final   Cholesterol 01/24/2024 103  <200 mg/dL Final   HDL 93/93/7974 34 (L)  > OR = 50 mg/dL Final   Triglycerides 93/93/7974 211 (H)  <150 mg/dL Final   Comment: . If a non-fasting specimen was collected, consider repeat triglyceride testing on a fasting specimen if clinically indicated.  Veatrice et al. J. of Clin. Lipidol. 2015;9:129-169. SABRA    LDL Cholesterol (Calc) 01/24/2024 41  mg/dL (calc) Final   Comment: Reference range: <100 . Desirable range <100 mg/dL for primary prevention;   <70 mg/dL for patients with CHD or  diabetic patients  with > or = 2 CHD risk factors. SABRA LDL-C is now calculated using the Martin-Hopkins  calculation, which is a validated novel method providing  better accuracy than the Friedewald equation in the  estimation of LDL-C.  Gladis APPLETHWAITE et al. SANDREA. 7986;689(80): 2061-2068  (http://education.[facility].com/faq/FAQ164)    Total CHOL/HDL Ratio 01/24/2024 3.0  <4.9 (calc) Final   Non-HDL Cholesterol (Calc) 01/24/2024 69  <130 mg/dL (calc) Final   Comment: For patients with diabetes plus 1 major ASCVD risk  factor, treating to a non-HDL-C goal of <100 mg/dL  (LDL-C of <29 mg/dL) is considered a therapeutic  option.    Hgb A1c MFr Bld 01/24/2024 5.2  <5.7 % Final   Comment: For the purpose of screening for the presence of diabetes: . <5.7%       Consistent with the absence of diabetes 5.7-6.4%    Consistent with increased risk for diabetes             (prediabetes) > or =6.5%  Consistent with diabetes . This assay result is consistent with a decreased risk of diabetes. . Currently, no consensus exists regarding use of hemoglobin A1c for diagnosis of diabetes in children. . According to American Diabetes Association (ADA) guidelines, hemoglobin A1c <7.0% represents optimal control in non-pregnant diabetic patients. Different metrics may apply to specific patient populations.  Standards of Medical Care in Diabetes(ADA). .    Mean Plasma Glucose 01/24/2024 103  mg/dL Final   eAG (mmol/L) 93/93/7974 5.7  mmol/L Final   Creatinine, Urine 01/24/2024 207  20 - 275 mg/dL Final   Microalb, Ur 93/93/7974 15.0  mg/dL Final   Comment: Reference Range Not established    Microalb Creat Ratio 01/24/2024 72 (H)  <30 mg/g creat Final   Comment: . The ADA defines abnormalities in albumin excretion as follows: SABRA Albuminuria Category        Result (mg/g creatinine) . Normal to Mildly increased   <30 Moderately increased         30-299  Severely increased           > OR =  300 . The ADA recommends that at least two of three specimens collected within a 3-6 month period be abnormal before considering a patient to be within a diagnostic category.    Vitamin B-12 01/24/2024 464  200 - 1,100 pg/mL Final     Past Medical History:  Diagnosis Date   Colon polyp    Complication of anesthesia    CTS (carpal tunnel syndrome)    Depression    Diabetes mellitus without complication (HCC)    prediabetes   Dyslipidemia    Gastritis    Hypertension    Neuromuscular disorder (HCC)    peripheral neuropathy   Past Surgical History:  Procedure Laterality Date  COLONOSCOPY WITH PROPOFOL  N/A 02/14/2023   Procedure: COLONOSCOPY WITH PROPOFOL ;  Surgeon: Eartha Angelia Sieving, MD;  Location: [facility];  Service: Gastroenterology;  Laterality: N/A;  8:00am;asa 3   POLYPECTOMY  02/14/2023   Procedure: POLYPECTOMY;  Surgeon: Eartha Angelia, Sieving, MD;  Location: [facility];  Service: Gastroenterology;;   Current Outpatient Medications on File Prior to Visit  Medication Sig Dispense Refill   amLODipine  (NORVASC ) 10 MG tablet Take 1 tablet by mouth once daily 90 tablet 3   ammonium lactate (AMLACTIN) 12 % cream Apply 1 Application topically as needed for dry skin.     aspirin 81 MG tablet Take 81 mg by mouth daily.     atorvastatin  (LIPITOR) 10 MG tablet Take 1 tablet (10 mg total) by mouth daily. 90 tablet 3   celecoxib  (CELEBREX ) 200 MG capsule Take 1 capsule by mouth twice daily 60 capsule 0   cephALEXin  (KEFLEX ) 500 MG capsule Take 1 capsule (500 mg total) by mouth 3 (three) times daily. 21 capsule 0   CINNAMON PO Take 2,000 mg by mouth daily.     cyanocobalamin  (VITAMIN B12) 1000 MCG/ML injection INJECT 1 ML INTRAMUSCULARLY ONCE A MONTH 30 mL 0   diazepam  (VALIUM ) 10 MG tablet TAKE 1 TABLET BY MOUTH EVERY 8 HOURS AS NEEDED FOR MUSCLE SPASM 60 tablet 0   Efinaconazole  10 % SOLN As directed for toenail fungus 8 mL 3   ezetimibe  (ZETIA ) 10 MG tablet Take  1 tablet (10 mg total) by mouth daily. 90 tablet 3   Flaxseed, Linseed, (FLAX SEEDS PO) Take 1 capsule by mouth daily.     fluticasone (FLONASE) 50 MCG/ACT nasal spray Place 1 spray into both nostrils daily as needed for allergies.     hydrochlorothiazide  (MICROZIDE ) 12.5 MG capsule Take 1 capsule (12.5 mg total) by mouth as needed. 90 capsule 1   hypromellose (SYSTANE NIGHT) 0.3 % GEL ophthalmic ointment 1 Application as needed for dry eyes.     LORazepam  (ATIVAN ) 1 MG tablet Take 1 tablet (1 mg total) by mouth at bedtime as needed. 90 tablet 0   losartan  (COZAAR ) 100 MG tablet Take 1 tablet (100 mg total) by mouth daily. 30 tablet 3   Magnesium 500 MG TABS Take 500 mg by mouth daily.     metoprolol  succinate (TOPROL -XL) 100 MG 24 hr tablet Take 1 tablet (100 mg total) by mouth daily. Take with or immediately following a meal. 90 tablet 3   Misc Natural Products (BLACK CHERRY CONCENTRATE PO) Take 1 capsule by mouth daily.     Multiple Vitamins-Minerals (PRESERVISION AREDS 2 PO) Take 1 capsule by mouth in the morning and at bedtime.     nitrofurantoin, macrocrystal-monohydrate, (MACROBID) 100 MG capsule Take 100 mg by mouth 2 (two) times daily.     omega-3 acid ethyl esters (LOVAZA ) 1 g capsule Take 2 capsules (2 g total) by mouth 2 (two) times daily. 360 capsule 2   ondansetron  (ZOFRAN ) 4 MG tablet Take 1 tablet (4 mg total) by mouth every 8 (eight) hours as needed for nausea or vomiting. 6 tablet 0   OZEMPIC , 2 MG/DOSE, 8 MG/3ML SOPN INJECT 2MG  UNDER THE SKIN EVERY WEEK 3 mL 11   phenazopyridine  (PYRIDIUM ) 100 MG tablet Take 1 tablet (100 mg total) by mouth 2 (two) times daily as needed for pain (urinray pain). 20 tablet 0   Polyethyl Glycol-Propyl Glycol (SYSTANE OP) Place 1 drop into both eyes as needed (dry eyes).  sodium chloride (MURO 128) 5 % ophthalmic ointment Place 1 Application into both eyes as needed for eye irritation.     tiZANidine  (ZANAFLEX ) 4 MG tablet TAKE 1 TABLET BY MOUTH  EVERY 8 HOURS AS NEEDED FOR MUSCLE SPASM 90 tablet 3   TURMERIC PO Take 1,000 mg by mouth daily.     UNABLE TO FIND Med Name: TENS UNIT DX: M54.2; M54.9 1 Units 0   UNABLE TO FIND Hyperbotic for womens urinary health     Vitamin D , Ergocalciferol , (DRISDOL ) 1.25 MG (50000 UNIT) CAPS capsule Take 1 capsule (50,000 Units total) by mouth every 7 (seven) days. 12 capsule 0   No current facility-administered medications on file prior to visit.   Allergies  Allergen Reactions   Lyrica [Pregabalin]     nightmares   Social History   Socioeconomic History   Marital status: Married    Spouse name: Not on file   Number of children: Not on file   Years of education: Not on file   Highest education level: Not on file  Occupational History   Not on file  Tobacco Use   Smoking status: Former    Current packs/day: 0.00    Types: Cigarettes    Quit date: 01/23/1988    Years since quitting: 36.1    Passive exposure: Past   Smokeless tobacco: Never  Vaping Use   Vaping status: Never Used  Substance and Sexual Activity   Alcohol use: No   Drug use: No   Sexual activity: Not Currently  Other Topics Concern   Not on file  Social History Narrative   Not on file   Social Drivers of Health   Financial Resource Strain: Low Risk  (01/30/2024)   Overall Financial Resource Strain (CARDIA)    Difficulty of Paying Living Expenses: Not hard at all  Food Insecurity: No Food Insecurity (01/30/2024)   Hunger Vital Sign    Worried About Running Out of Food in the Last Year: Never true    Ran Out of Food in the Last Year: Never true  Transportation Needs: No Transportation Needs (01/30/2024)   PRAPARE - Administrator, Civil Service (Medical): No    Lack of Transportation (Non-Medical): No  Physical Activity: Inactive (01/30/2024)   Exercise Vital Sign    Days of Exercise per Week: 0 days    Minutes of Exercise per Session: 0 min  Stress: No Stress Concern Present (01/30/2024)   [employer] of Occupational Health - Occupational Stress Questionnaire    Feeling of Stress: Only a little  Social Connections: Moderately Integrated (01/30/2024)   Social Connection and Isolation Panel    Frequency of Communication with Friends and Family: Twice a week    Frequency of Social Gatherings with Friends and Family: Twice a week    Attends Religious Services: More than 4 times per year    Active Member of [employer] or Organizations: No    Attends Banker Meetings: Never    Marital Status: Married  Catering manager Violence: Not At Risk (01/30/2024)   Humiliation, Afraid, Rape, and Kick questionnaire    Fear of Current or Ex-Partner: No    Emotionally Abused: No    Physically Abused: No    Sexually Abused: No   No family history on file.    Review of Systems  All other systems reviewed and are negative.      Objective:   Physical Exam Vitals reviewed.  Constitutional:  General: She is not in acute distress.    Appearance: She is well-developed. She is not diaphoretic.  HENT:     Head: Normocephalic and atraumatic.     Right Ear: External ear normal.     Left Ear: External ear normal.     Nose: Nose normal.     Mouth/Throat:     Pharynx: No oropharyngeal exudate.  Eyes:     General: No scleral icterus.    Conjunctiva/sclera: Conjunctivae normal.     Pupils: Pupils are equal, round, and reactive to light.  Neck:     Thyroid : No thyromegaly.     Vascular: No JVD.     Trachea: No tracheal deviation.  Cardiovascular:     Rate and Rhythm: Normal rate and regular rhythm.     Heart sounds: Normal heart sounds. No murmur heard.    No friction rub. No gallop.  Pulmonary:     Effort: Pulmonary effort is normal. No respiratory distress.     Breath sounds: Normal breath sounds. No stridor. No wheezing or rales.  Chest:     Chest wall: No tenderness.  Abdominal:     General: Bowel sounds are normal. There is no distension.     Palpations: Abdomen is  soft. There is no mass.     Tenderness: There is no abdominal tenderness. There is no guarding or rebound.  Musculoskeletal:        General: No tenderness. Normal range of motion.     Cervical back: Normal range of motion and neck supple.  Lymphadenopathy:     Cervical: No cervical adenopathy.  Skin:    General: Skin is warm.     Coloration: Skin is not pale.     Findings: No erythema or rash.  Neurological:     Mental Status: She is alert and oriented to person, place, and time.     Cranial Nerves: No cranial nerve deficit.     Motor: No abnormal muscle tone.     Coordination: Coordination normal.     Deep Tendon Reflexes: Reflexes are normal and symmetric.  Psychiatric:        Mood and Affect: Mood is anxious. Affect is labile.        Behavior: Behavior normal.        Thought Content: Thought content normal.        Judgment: Judgment normal.    No visits with results within 1 Month(s) from this visit.  Latest known visit with results is:  Lab on 01/24/2024  Component Date Value Ref Range Status   WBC 01/24/2024 7.5  3.8 - 10.8 Thousand/uL Final   RBC 01/24/2024 4.56  3.80 - 5.10 Million/uL Final   Hemoglobin 01/24/2024 13.4  11.7 - 15.5 g/dL Final   HCT 93/93/7974 40.7  35.0 - 45.0 % Final   MCV 01/24/2024 89.3  80.0 - 100.0 fL Final   MCH 01/24/2024 29.4  27.0 - 33.0 pg Final   MCHC 01/24/2024 32.9  32.0 - 36.0 g/dL Final   Comment: For adults, a slight decrease in the calculated MCHC value (in the range of 30 to 32 g/dL) is most likely not clinically significant; however, it should be interpreted with caution in correlation with other red cell parameters and the patient's clinical condition.    RDW 01/24/2024 12.7  11.0 - 15.0 % Final   Platelets 01/24/2024 256  140 - 400 Thousand/uL Final   MPV 01/24/2024 10.2  7.5 - 12.5 fL Final   Neutro Abs  01/24/2024 4,470  1,500 - 7,800 cells/uL Final   Absolute Lymphocytes 01/24/2024 2,295  850 - 3,900 cells/uL Final    Absolute Monocytes 01/24/2024 518  200 - 950 cells/uL Final   Eosinophils Absolute 01/24/2024 158  15 - 500 cells/uL Final   Basophils Absolute 01/24/2024 60  0 - 200 cells/uL Final   Neutrophils Relative % 01/24/2024 59.6  % Final   Total Lymphocyte 01/24/2024 30.6  % Final   Monocytes Relative 01/24/2024 6.9  % Final   Eosinophils Relative 01/24/2024 2.1  % Final   Basophils Relative 01/24/2024 0.8  % Final   Glucose, Bld 01/24/2024 106 (H)  65 - 99 mg/dL Final   Comment: .            Fasting reference interval . For someone without known diabetes, a glucose value between 100 and 125 mg/dL is consistent with prediabetes and should be confirmed with a follow-up test. .    BUN 01/24/2024 11  7 - 25 mg/dL Final   Creat 93/93/7974 0.68  0.60 - 1.00 mg/dL Final   BUN/Creatinine Ratio 01/24/2024 SEE NOTE:  6 - 22 (calc) Final   Comment:    Not Reported: BUN and Creatinine are within    reference range. .    Sodium 01/24/2024 140  135 - 146 mmol/L Final   Potassium 01/24/2024 4.1  3.5 - 5.3 mmol/L Final   Chloride 01/24/2024 106  98 - 110 mmol/L Final   CO2 01/24/2024 22  20 - 32 mmol/L Final   Calcium  01/24/2024 8.9  8.6 - 10.4 mg/dL Final   Total Protein 93/93/7974 6.6  6.1 - 8.1 g/dL Final   Albumin 93/93/7974 4.0  3.6 - 5.1 g/dL Final   Globulin 93/93/7974 2.6  1.9 - 3.7 g/dL (calc) Final   AG Ratio 01/24/2024 1.5  1.0 - 2.5 (calc) Final   Total Bilirubin 01/24/2024 0.5  0.2 - 1.2 mg/dL Final   Alkaline phosphatase (APISO) 01/24/2024 70  37 - 153 U/L Final   AST 01/24/2024 13  10 - 35 U/L Final   ALT 01/24/2024 12  6 - 29 U/L Final   Cholesterol 01/24/2024 103  <200 mg/dL Final   HDL 93/93/7974 34 (L)  > OR = 50 mg/dL Final   Triglycerides 93/93/7974 211 (H)  <150 mg/dL Final   Comment: . If a non-fasting specimen was collected, consider repeat triglyceride testing on a fasting specimen if clinically indicated.  Veatrice et al. J. of Clin. Lipidol. 2015;9:129-169. SABRA     LDL Cholesterol (Calc) 01/24/2024 41  mg/dL (calc) Final   Comment: Reference range: <100 . Desirable range <100 mg/dL for primary prevention;   <70 mg/dL for patients with CHD or diabetic patients  with > or = 2 CHD risk factors. SABRA LDL-C is now calculated using the Martin-Hopkins  calculation, which is a validated novel method providing  better accuracy than the Friedewald equation in the  estimation of LDL-C.  Gladis APPLETHWAITE et al. SANDREA. 7986;689(80): 2061-2068  (http://education.[facility].com/faq/FAQ164)    Total CHOL/HDL Ratio 01/24/2024 3.0  <4.9 (calc) Final   Non-HDL Cholesterol (Calc) 01/24/2024 69  <130 mg/dL (calc) Final   Comment: For patients with diabetes plus 1 major ASCVD risk  factor, treating to a non-HDL-C goal of <100 mg/dL  (LDL-C of <29 mg/dL) is considered a therapeutic  option.    Hgb A1c MFr Bld 01/24/2024 5.2  <5.7 % Final   Comment: For the purpose of screening for the presence of diabetes: . <  5.7%       Consistent with the absence of diabetes 5.7-6.4%    Consistent with increased risk for diabetes             (prediabetes) > or =6.5%  Consistent with diabetes . This assay result is consistent with a decreased risk of diabetes. . Currently, no consensus exists regarding use of hemoglobin A1c for diagnosis of diabetes in children. . According to American Diabetes Association (ADA) guidelines, hemoglobin A1c <7.0% represents optimal control in non-pregnant diabetic patients. Different metrics may apply to specific patient populations.  Standards of Medical Care in Diabetes(ADA). .    Mean Plasma Glucose 01/24/2024 103  mg/dL Final   eAG (mmol/L) 93/93/7974 5.7  mmol/L Final   Creatinine, Urine 01/24/2024 207  20 - 275 mg/dL Final   Microalb, Ur 93/93/7974 15.0  mg/dL Final   Comment: Reference Range Not established    Microalb Creat Ratio 01/24/2024 72 (H)  <30 mg/g creat Final   Comment: . The ADA defines abnormalities in  albumin excretion as follows: SABRA Albuminuria Category        Result (mg/g creatinine) . Normal to Mildly increased   <30 Moderately increased         30-299  Severely increased           > OR = 300 . The ADA recommends that at least two of three specimens collected within a 3-6 month period be abnormal before considering a patient to be within a diagnostic category.    Vitamin B-12 01/24/2024 464  200 - 1,100 pg/mL Final           Assessment & Plan:  Encounter for Medicare annual wellness exam  Metabolic syndrome  General medical exam  Degenerative cervical disc  Dyslipidemia  Diabetes mellitus without complication (HCC)  Coronary artery disease involving native coronary artery of native heart without angina pectoris  Dysuria - Plan: Urinalysis, Routine w reflex microscopic I am very happy with her blood work.  Her blood pressure is excellent.  Colonoscopy is not due again until 2029.  Mammogram is up-to-date.  We discussed Capvaxive and rsv and td immunizations.  Regular anticipatory guidance is provided.  Hemoglobin A1c is outstanding.  LDL cholesterol is below 55.  Blood pressure is outstanding.

## 2024-03-23 ENCOUNTER — Other Ambulatory Visit: Payer: Self-pay

## 2024-03-23 MED ORDER — OMEGA-3-ACID ETHYL ESTERS 1 G PO CAPS: 2.0000 g | ORAL_CAPSULE | Freq: Two times a day (BID) | ORAL | 2 refills | Status: AC

## 2024-03-29 NOTE — Addendum Note (Signed)
 Addended by: DUANNE LOWERS T on: 03/29/2024 10:41 PM   Modules accepted: Level of Service

## 2024-06-02 ENCOUNTER — Other Ambulatory Visit: Payer: Self-pay | Admitting: Family Medicine

## 2024-06-02 MED ORDER — DIAZEPAM 10 MG PO TABS: ORAL_TABLET | ORAL | 0 refills | Status: AC

## 2024-06-24 ENCOUNTER — Encounter (INDEPENDENT_AMBULATORY_CARE_PROVIDER_SITE_OTHER): Payer: Self-pay | Admitting: Gastroenterology

## 2024-06-26 ENCOUNTER — Other Ambulatory Visit: Payer: Self-pay | Admitting: Family Medicine

## 2024-06-29 NOTE — Telephone Encounter (Signed)
 Requested Prescriptions  Pending Prescriptions Disp Refills   cyanocobalamin  (VITAMIN B12) 1000 MCG/ML injection [Pharmacy Med Name: CYANOCOBAL 1,000MCG/ML (1X25)] 30 mL 0    Sig: INJECT 1 ML INTRAMUSCULARLY ONCE A MONTH     Endocrinology:  Vitamins - Vitamin B12 Passed - 06/29/2024  9:36 AM      Passed - HCT in normal range and within 360 days    HCT  Date Value Ref Range Status  01/24/2024 40.7 35.0 - 45.0 % Final         Passed - HGB in normal range and within 360 days    Hemoglobin  Date Value Ref Range Status  01/24/2024 13.4 11.7 - 15.5 g/dL Final         Passed - B12 Level in normal range and within 360 days    Vitamin B-12  Date Value Ref Range Status  01/24/2024 464 200 - 1,100 pg/mL Final         Passed - Valid encounter within last 12 months    Recent Outpatient Visits           3 months ago Encounter for Medicare annual wellness exam   [facility] Duanne Butler DASEN, MD   9 months ago Dysuria   [facility] Duanne, Butler DASEN, MD   10 months ago Dysuria   [facility] Duanne Butler DASEN, MD   1 year ago Encounter for screening mammogram for malignant neoplasm of breast   [facility] Duanne Butler DASEN, MD   2 years ago Dysuria   [facility] Kayla Jeoffrey RAMAN, FNP       Future Appointments             In 1 month Anner Alm ORN, MD [facility] of [facility] [employer], [employer]

## 2024-07-10 ENCOUNTER — Telehealth: Payer: Self-pay | Admitting: Cardiology

## 2024-07-10 MED ORDER — LOSARTAN POTASSIUM 100 MG PO TABS
100.0000 mg | ORAL_TABLET | Freq: Every day | ORAL | 0 refills | Status: AC
Start: 2024-07-10 — End: ?

## 2024-07-10 NOTE — Telephone Encounter (Signed)
 Pt scheduled to see Dr. Anner 08/26/23.  Refill sent.

## 2024-07-10 NOTE — Telephone Encounter (Signed)
*  STAT* If patient is at the pharmacy, call can be transferred to refill team.   1. Which medications need to be refilled? (please list name of each medication and dose if known) losartan  (COZAAR ) 100 MG tablet    2. Would you like to learn more about the convenience, safety, & potential cost savings by using the [facility]?   no   3. Are you open to using the [facility] (Type [facility].  ). no   4. Which pharmacy/location (including street and city if local pharmacy) is medication to be sent to? [id number] - [place]), [place] - [address]    5. Do they need a 30 day or 90 day supply? 90 day Patient is out of medication

## 2024-07-17 ENCOUNTER — Encounter: Payer: Self-pay | Admitting: Family Medicine

## 2024-07-20 ENCOUNTER — Telehealth: Payer: Self-pay

## 2024-07-20 NOTE — Telephone Encounter (Signed)
 Copied from CRM #[id number]. Topic: Clinical - Medical Advice >> Jul 20, 2024 12:01 PM Dedra B wrote: Reason for CRM: Pt is working on her 2026 calendar and wants to know if Dr. Duanne wants her to have labs before her physical appt 03/19/25 or afterwards.

## 2024-08-17 ENCOUNTER — Other Ambulatory Visit: Payer: Self-pay | Admitting: Family Medicine

## 2024-08-25 ENCOUNTER — Ambulatory Visit: Attending: Cardiology | Admitting: Cardiology

## 2024-08-25 ENCOUNTER — Encounter: Payer: Self-pay | Admitting: Cardiology

## 2024-08-25 VITALS — BP 109/73 | HR 78 | Ht 64.0 in | Wt 243.0 lb

## 2024-08-25 DIAGNOSIS — I10 Essential (primary) hypertension: Secondary | ICD-10-CM | POA: Diagnosis present

## 2024-08-25 DIAGNOSIS — R002 Palpitations: Secondary | ICD-10-CM | POA: Insufficient documentation

## 2024-08-25 DIAGNOSIS — G4733 Obstructive sleep apnea (adult) (pediatric): Secondary | ICD-10-CM | POA: Diagnosis not present

## 2024-08-25 DIAGNOSIS — E119 Type 2 diabetes mellitus without complications: Secondary | ICD-10-CM | POA: Diagnosis present

## 2024-08-25 DIAGNOSIS — E785 Hyperlipidemia, unspecified: Secondary | ICD-10-CM | POA: Insufficient documentation

## 2024-08-25 NOTE — Progress Notes (Signed)
 " Cardiology Office Note:  .   Date:  08/30/2024  ID:  Kelli Moss, DOB [birth year], MRN [id number] PCP: Duanne Butler DASEN, MD  [facility] Providers Cardiologist:  Alm Clay, MD     Chief Complaint  Patient presents with   Follow-up    No new issues; still uses CPAP.    Patient Profile: .     Kelli Moss is a morbidly obese 74 y.o. female  with a PMH notable for sleep apnea who presents here for ~36-month follow-up and to establish cardiology care at the request of Duanne Butler DASEN, MD.  OSA: ResMed AirSense 11 CPAP unit on September 11, 2022 with [employer] as her DME company. A download was obtained from January 23 through October 10, 2022 which shows excellent compliance. She was using therapy 10 hours and 10 minutes per night. At a 15 cm set pressure, AHI was 1.2. A subsequent download from April 1 through December 18, 2022 continue to show compliance which average sleep duration at 7 hours and 33 minutes. AHI was 2.9/h.  Hypertension Hyperlipidemia DM-2 Coronary CTA (January 2020) : Coronary Calcium  Score 23.<30% mid LAD otherwise no notable plaque disease.     EVERLINE Moss was last seen by Dr. Burnard on Dec 24, 2023 prior to his retirement.  She noted a 35 pound weight loss since starting on Ozempic ..  Continued on CPAP, but has not been referred to Dr. Shlomo yet.  (Device apparently set at 15 cm), she was sleeping well.  No issues.  Subjective  Discussed the use of AI scribe software for clinical note transcription with the patient, who gave verbal consent to proceed.  History of Present Illness Kelli Moss is a 74 year old female with hypertension, hyperlipidemia, and sleep apnea who presents for follow-up of her cardiovascular health and medication management.  She experiences episodes of chest tightness and difficulty swallowing, sometimes leading to vomiting. These episodes occur approximately once a month, often associated with eating or  swallowing, but can also happen when she is anxious. The sensation is described as feeling 'verklempt' and can occur even when drinking water . A gastrointestinal evaluation, including an endoscopy, did not reveal any abnormalities.  She has a history of hypertension and is currently taking amlodipine  10 mg, losartan  100 mg, metoprolol  succinate 100 mg, and hydrochlorothiazide  12.5 mg.  For hyperlipidemia, she is on atorvastatin , Zetia  10 mg, and Lovaza  1 gram, although she is taking Lovaza  only once daily instead of the prescribed two tablets twice daily. Her last cholesterol check in June showed satisfactory levels, though her triglycerides were slightly elevated.  She uses a CPAP machine for sleep apnea with settings of 15 to 20 cm H2O, using it for 7 to 10 hours per night. Her last sleep study was in May of the previous year.  She reports occasional palpitations described as 'flippy floppy' sensations, more frequent when lying down. These episodes can last up to 15 minutes and are sometimes accompanied by dizziness. Calming down often alleviates the symptoms.  She experiences leg swelling, particularly after being on her feet all day, which is relieved by using bilateral sequential compression devices. No shortness of breath when lying down or waking up due to breathing difficulties.  She has been taking Ozempic  for diabetes management and has lost 35 pounds since starting the medication, although she gained some weight over the holiday season.    Objective   Pertinent CAD-related Medications: Amlodipine  10 mg daily; losartan   100 mg daily; Toprol -XL 100 mg daily; PRN HCTZ 12.5 mg for edema Aspirin 81 mg daily; atorvastatin  10 mg daily, Zetia  10 mg daily; CoQ 10 30 mg TID; Lovaza  2 g twice daily Ozempic  2 g 2 week;   Studies Reviewed: SABRA   EKG Interpretation Date/Time:  Tuesday August 25 2024 10:58:19 EST Ventricular Rate:  78 PR Interval:  172 QRS Duration:  146 QT Interval:  434 QTC  Calculation: 494 R Axis:   35  Text Interpretation: Normal sinus rhythm Right bundle branch block When compared with ECG of 24-Dec-2023 14:01, No significant change was found Confirmed by Anner Lenis ([id number]) on 08/25/2024 11:31:20 AM    Lab Results  Component Value Date   CHOL 103 01/24/2024   HDL 34 (L) 01/24/2024   LDLCALC 41 01/24/2024   TRIG 211 (H) 01/24/2024   CHOLHDL 3.0 01/24/2024   Lab Results  Component Value Date   NA 140 01/24/2024   K 4.1 01/24/2024   CREATININE 0.68 01/24/2024   EGFR 95 08/12/2023   GLUCOSE 106 (H) 01/24/2024   Lab Results  Component Value Date   ALT 12 01/24/2024   AST 13 01/24/2024   ALKPHOS 65 02/12/2023   BILITOT 0.5 01/24/2024   Lab Results  Component Value Date   WBC 7.5 01/24/2024   HGB 13.4 01/24/2024   HCT 40.7 01/24/2024   MCV 89.3 01/24/2024   PLT 256 01/24/2024   Results Labs Cholesterol panel (01/2024): Cholesterol within normal limits, elevated triglycerides  Radiology Coronary CT angiogram (08/2018): No obstructive coronary artery disease, coronary calcium  score 23, 30% stenosis in mid-distal left anterior descending artery  Diagnostic Echocardiogram (07/2018): Left ventricular ejection fraction 65-70%, moderate left ventricular hypertrophy, grade 1 diastolic dysfunction, mild left atrial enlargement, vigorous left ventricular function  Risk Assessment/Calculations:          Physical Exam:   VS:  BP 109/73   Pulse 78   Ht 5' 4 (1.626 m)   Wt 243 lb (110.2 kg)   SpO2 94%   BMI 41.71 kg/m    Wt Readings from Last 3 Encounters:  08/25/24 243 lb (110.2 kg)  03/13/24 236 lb (107 kg)  12/24/23 247 lb 6.4 oz (112.2 kg)    GEN: Morbidly obese.  Otherwise well nourished, well-groomed; in no acute distress;  NECK: No JVD; No carotid bruits CARDIAC: Distant but normal S1, S2; RRR; follow-up/faint 1/6 SEM at RUSB.  Otherwise no murmurs, rubs, gallops RESPIRATORY:  Clear to auscultation without rales, wheezing or  rhonchi ; nonlabored, good air movement. ABDOMEN: Soft, non-tender, non-distended EXTREMITIES:  No edema; No deformity     ASSESSMENT AND PLAN: .    Essential hypertension Blood pressure well-controlled. Moderate left ventricular hypertrophy and grade one diastolic dysfunction likely due to hypertension. No atrial fibrillation. - Continue amlodipine  10 mg, losartan  100 mg, metoprolol  succinate 100 mg,  - She only uses HCTZ 12.5 mg prn edema.  Dyslipidemia Cholesterol within range. Slightly elevated triglycerides. Coronary CT angiogram in January 2020 showed no obstructive disease, coronary calcium  score 23. Echocardiogram in December 2019 showed good left ventricular function, mild left atrial enlargement. - Ensure Lovaza  prescription: two tablets twice daily. - Continue low-dose atorvastatin  10 mg plus Zetia  10 mg. - Continue CoQ10  Diabetes mellitus without complication (HCC) Managed by PCP with Ozempic .  Palpitations Intermittent irregular heartbeats, more noticeable when lying down, lasting 15 minutes, sometimes with dizziness. Metoprolol  effective, but may cause some fatigue. - Continue current metoprolol  dose.  OSA on CPAP  OSA on CPAP for >20 years; most recent sleep study 2017 showed moderately severe sleep apnea with severe sleep apnea during REM sleep. She also had occasional periodic limb movement of sleep. She is compliant with therapy.  Last sleep study in May last year. No nocturnal dyspnea or apnea-related awakenings. - Schedule follow-up sleep study in 4-5 months.  Orders Placed This Encounter  Procedures   Ambulatory referral to Cardiology -> Dr. Shlomo, OSA   EKG 12-Lead        Follow-Up: Return in about 1 year (around 08/25/2025).     Signed, Alm MICAEL Clay, MD, MS Alm Clay, M.D., M.S. Interventional Cardiologist  [facility] Pager # [phone]      "

## 2024-08-25 NOTE — Patient Instructions (Addendum)
 Medication Instructions:     Take Lovaza   2 tablets twice a day   *If you need a refill on your cardiac medications before your next appointment, please call your pharmacy*   Lab Work: Not needed If you have labs (blood work) drawn today and your tests are completely normal, you will receive your results only by: MyChart Message (if you have MyChart) OR A paper copy in the mail If you have any lab test that is abnormal or we need to change your treatment, we will call you to review the results.   Testing/Procedures: Not needed   Follow-Up: At [facility], you and your health needs are our priority.  As part of our continuing mission to provide you with exceptional heart care, we have created designated Provider Care Teams.  These Care Teams include your primary Cardiologist (physician) and Advanced Practice Providers (APPs -  Physician Assistants and Nurse Practitioners) who all work together to provide you with the care you need, when you need it.     Your next appointment:   12 month(s)  The format for your next appointment:   In Person  Provider:   Alm Clay, MD  You have been referred to Sleep Medicine Dr Shlomo for  OSA   Other Instructions

## 2024-08-30 ENCOUNTER — Encounter: Payer: Self-pay | Admitting: Cardiology

## 2024-08-30 DIAGNOSIS — I10 Essential (primary) hypertension: Secondary | ICD-10-CM | POA: Insufficient documentation

## 2024-08-30 DIAGNOSIS — G4733 Obstructive sleep apnea (adult) (pediatric): Secondary | ICD-10-CM | POA: Insufficient documentation

## 2024-08-30 DIAGNOSIS — R002 Palpitations: Secondary | ICD-10-CM | POA: Insufficient documentation

## 2024-08-30 NOTE — Assessment & Plan Note (Signed)
 OSA on CPAP for >20 years; most recent sleep study 2017 showed moderately severe sleep apnea with severe sleep apnea during REM sleep. She also had occasional periodic limb movement of sleep. She is compliant with therapy.  Last sleep study in May last year. No nocturnal dyspnea or apnea-related awakenings. - Schedule follow-up sleep study in 4-5 months.

## 2024-08-30 NOTE — Assessment & Plan Note (Signed)
 Managed by PCP with Ozempic .

## 2024-08-30 NOTE — Assessment & Plan Note (Signed)
 Cholesterol within range. Slightly elevated triglycerides. Coronary CT angiogram in January 2020 showed no obstructive disease, coronary calcium  score 23. Echocardiogram in December 2019 showed good left ventricular function, mild left atrial enlargement. - Ensure Lovaza  prescription: two tablets twice daily. - Continue low-dose atorvastatin  10 mg plus Zetia  10 mg. - Continue CoQ10

## 2024-08-30 NOTE — Assessment & Plan Note (Signed)
 Blood pressure well-controlled. Moderate left ventricular hypertrophy and grade one diastolic dysfunction likely due to hypertension. No atrial fibrillation. - Continue amlodipine  10 mg, losartan  100 mg, metoprolol  succinate 100 mg,  - She only uses HCTZ 12.5 mg prn edema.

## 2024-08-30 NOTE — Assessment & Plan Note (Signed)
 Intermittent irregular heartbeats, more noticeable when lying down, lasting 15 minutes, sometimes with dizziness. Metoprolol  effective, but may cause some fatigue. - Continue current metoprolol  dose.

## 2025-02-04 ENCOUNTER — Encounter

## 2025-03-12 ENCOUNTER — Other Ambulatory Visit

## 2025-03-19 ENCOUNTER — Encounter: Admitting: Family Medicine
